# Patient Record
Sex: Female | Born: 1940 | Race: White | Hispanic: No | Smoking: Never smoker
Health system: Southern US, Community
[De-identification: ages and names within clinical notes are randomized; demographics above are authoritative.]

## PROBLEM LIST (undated history)

## (undated) ENCOUNTER — Emergency Department (HOSPITAL_COMMUNITY): Admission: EM

## (undated) DIAGNOSIS — M8580 Other specified disorders of bone density and structure, unspecified site: Secondary | ICD-10-CM

## (undated) DIAGNOSIS — D259 Leiomyoma of uterus, unspecified: Secondary | ICD-10-CM

## (undated) DIAGNOSIS — K429 Umbilical hernia without obstruction or gangrene: Secondary | ICD-10-CM

## (undated) DIAGNOSIS — K222 Esophageal obstruction: Secondary | ICD-10-CM

## (undated) DIAGNOSIS — M1990 Unspecified osteoarthritis, unspecified site: Secondary | ICD-10-CM

## (undated) DIAGNOSIS — I998 Other disorder of circulatory system: Secondary | ICD-10-CM

## (undated) DIAGNOSIS — I10 Essential (primary) hypertension: Secondary | ICD-10-CM

## (undated) DIAGNOSIS — J309 Allergic rhinitis, unspecified: Secondary | ICD-10-CM

## (undated) DIAGNOSIS — Z8719 Personal history of other diseases of the digestive system: Secondary | ICD-10-CM

## (undated) DIAGNOSIS — K633 Ulcer of intestine: Secondary | ICD-10-CM

## (undated) DIAGNOSIS — R000 Tachycardia, unspecified: Secondary | ICD-10-CM

## (undated) DIAGNOSIS — T7840XA Allergy, unspecified, initial encounter: Secondary | ICD-10-CM

## (undated) DIAGNOSIS — F419 Anxiety disorder, unspecified: Secondary | ICD-10-CM

## (undated) DIAGNOSIS — D649 Anemia, unspecified: Secondary | ICD-10-CM

## (undated) DIAGNOSIS — Z5189 Encounter for other specified aftercare: Secondary | ICD-10-CM

## (undated) DIAGNOSIS — K5790 Diverticulosis of intestine, part unspecified, without perforation or abscess without bleeding: Secondary | ICD-10-CM

## (undated) DIAGNOSIS — M419 Scoliosis, unspecified: Secondary | ICD-10-CM

## (undated) DIAGNOSIS — N189 Chronic kidney disease, unspecified: Secondary | ICD-10-CM

## (undated) DIAGNOSIS — H269 Unspecified cataract: Secondary | ICD-10-CM

## (undated) DIAGNOSIS — E785 Hyperlipidemia, unspecified: Secondary | ICD-10-CM

## (undated) DIAGNOSIS — H04123 Dry eye syndrome of bilateral lacrimal glands: Secondary | ICD-10-CM

## (undated) DIAGNOSIS — D126 Benign neoplasm of colon, unspecified: Secondary | ICD-10-CM

## (undated) DIAGNOSIS — K219 Gastro-esophageal reflux disease without esophagitis: Secondary | ICD-10-CM

## (undated) HISTORY — DX: Essential (primary) hypertension: I10

## (undated) HISTORY — DX: Unspecified osteoarthritis, unspecified site: M19.90

## (undated) HISTORY — DX: Allergic rhinitis, unspecified: J30.9

## (undated) HISTORY — DX: Diverticulosis of intestine, part unspecified, without perforation or abscess without bleeding: K57.90

## (undated) HISTORY — DX: Unspecified cataract: H26.9

## (undated) HISTORY — DX: Allergy, unspecified, initial encounter: T78.40XA

## (undated) HISTORY — PX: ESOPHAGEAL DILATION: SHX303

## (undated) HISTORY — DX: Leiomyoma of uterus, unspecified: D25.9

## (undated) HISTORY — DX: Gastro-esophageal reflux disease without esophagitis: K21.9

## (undated) HISTORY — DX: Personal history of other diseases of the digestive system: Z87.19

## (undated) HISTORY — DX: Esophageal obstruction: K22.2

## (undated) HISTORY — DX: Benign neoplasm of colon, unspecified: D12.6

## (undated) HISTORY — DX: Scoliosis, unspecified: M41.9

## (undated) HISTORY — PX: UPPER GASTROINTESTINAL ENDOSCOPY: SHX188

## (undated) HISTORY — DX: Encounter for other specified aftercare: Z51.89

## (undated) HISTORY — DX: Dry eye syndrome of bilateral lacrimal glands: H04.123

## (undated) HISTORY — DX: Anxiety disorder, unspecified: F41.9

## (undated) HISTORY — DX: Anemia, unspecified: D64.9

## (undated) HISTORY — PX: BIOPSY THYROID: PRO38

## (undated) HISTORY — DX: Hyperlipidemia, unspecified: E78.5

## (undated) HISTORY — PX: POLYPECTOMY: SHX149

## (undated) HISTORY — PX: COLONOSCOPY: SHX174

## (undated) HISTORY — DX: Other specified disorders of bone density and structure, unspecified site: M85.80

## (undated) HISTORY — DX: Tachycardia, unspecified: R00.0

## (undated) HISTORY — DX: Umbilical hernia without obstruction or gangrene: K42.9

## (undated) HISTORY — PX: OTHER SURGICAL HISTORY: SHX169

## (undated) HISTORY — DX: Chronic kidney disease, unspecified: N18.9

---
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
## 1966-12-01 HISTORY — PX: TONSILLECTOMY: SHX5217

## 1994-01-29 HISTORY — PX: OTHER SURGICAL HISTORY: SHX169

## 2004-09-27 ENCOUNTER — Encounter: Payer: Self-pay | Admitting: Internal Medicine

## 2008-08-14 ENCOUNTER — Encounter: Payer: Self-pay | Admitting: Internal Medicine

## 2008-08-14 LAB — CONVERTED CEMR LAB
CO2: 22 meq/L
Calcium: 10.4 mg/dL
Glucose, Bld: 86 mg/dL
Sodium: 140 meq/L

## 2008-10-31 LAB — CONVERTED CEMR LAB: Pap Smear: NEGATIVE

## 2008-11-01 ENCOUNTER — Encounter: Payer: Self-pay | Admitting: Internal Medicine

## 2008-11-02 ENCOUNTER — Encounter: Payer: Self-pay | Admitting: Internal Medicine

## 2008-11-02 LAB — CONVERTED CEMR LAB
AST: 29 units/L
Alkaline Phosphatase: 49 units/L
Calcium: 9.9 mg/dL
Chloride: 105 meq/L
Cholesterol: 253 mg/dL
Glucose, Bld: 104 mg/dL
HCT: 43.6 %
Hemoglobin: 14.1 g/dL
LDL Cholesterol: 159 mg/dL
Monocytes Relative: 7 %
Platelets: 207 10*3/uL
Potassium: 4.2 meq/L
Sodium: 142 meq/L
Total Protein: 6.8 g/dL

## 2008-12-14 ENCOUNTER — Encounter: Payer: Self-pay | Admitting: Internal Medicine

## 2009-05-11 ENCOUNTER — Encounter: Payer: Self-pay | Admitting: Internal Medicine

## 2009-08-01 LAB — HM DIABETES EYE EXAM: HM Diabetic Eye Exam: NORMAL

## 2009-08-09 ENCOUNTER — Encounter: Payer: Self-pay | Admitting: Internal Medicine

## 2009-12-13 ENCOUNTER — Ambulatory Visit: Payer: Self-pay | Admitting: Internal Medicine

## 2009-12-13 DIAGNOSIS — Z860100 Personal history of colon polyps, unspecified: Secondary | ICD-10-CM | POA: Insufficient documentation

## 2009-12-13 DIAGNOSIS — D259 Leiomyoma of uterus, unspecified: Secondary | ICD-10-CM | POA: Insufficient documentation

## 2009-12-13 DIAGNOSIS — K219 Gastro-esophageal reflux disease without esophagitis: Secondary | ICD-10-CM | POA: Insufficient documentation

## 2009-12-13 DIAGNOSIS — E1169 Type 2 diabetes mellitus with other specified complication: Secondary | ICD-10-CM | POA: Insufficient documentation

## 2009-12-13 DIAGNOSIS — Z8719 Personal history of other diseases of the digestive system: Secondary | ICD-10-CM | POA: Insufficient documentation

## 2009-12-13 DIAGNOSIS — E785 Hyperlipidemia, unspecified: Secondary | ICD-10-CM | POA: Insufficient documentation

## 2009-12-13 DIAGNOSIS — I10 Essential (primary) hypertension: Secondary | ICD-10-CM | POA: Insufficient documentation

## 2009-12-13 DIAGNOSIS — I152 Hypertension secondary to endocrine disorders: Secondary | ICD-10-CM | POA: Insufficient documentation

## 2009-12-13 DIAGNOSIS — J309 Allergic rhinitis, unspecified: Secondary | ICD-10-CM | POA: Insufficient documentation

## 2010-01-01 ENCOUNTER — Encounter: Payer: Self-pay | Admitting: Internal Medicine

## 2010-01-01 DIAGNOSIS — M1990 Unspecified osteoarthritis, unspecified site: Secondary | ICD-10-CM | POA: Insufficient documentation

## 2010-01-01 DIAGNOSIS — K222 Esophageal obstruction: Secondary | ICD-10-CM | POA: Insufficient documentation

## 2010-01-09 ENCOUNTER — Encounter: Payer: Self-pay | Admitting: Internal Medicine

## 2010-01-09 LAB — HM MAMMOGRAPHY

## 2010-04-04 ENCOUNTER — Ambulatory Visit: Payer: Self-pay | Admitting: Internal Medicine

## 2010-04-05 LAB — CONVERTED CEMR LAB
Cholesterol: 285 mg/dL — ABNORMAL HIGH (ref 0–200)
Creatinine, Ser: 0.8 mg/dL (ref 0.4–1.2)
HDL: 51.8 mg/dL (ref >39.00)
Triglycerides: 257 mg/dL — ABNORMAL HIGH (ref 0.0–149.0)
VLDL: 51.4 mg/dL — ABNORMAL HIGH (ref 0.0–40.0)

## 2010-07-09 ENCOUNTER — Telehealth (INDEPENDENT_AMBULATORY_CARE_PROVIDER_SITE_OTHER): Payer: Self-pay | Admitting: *Deleted

## 2010-07-10 ENCOUNTER — Encounter: Payer: Self-pay | Admitting: Internal Medicine

## 2010-08-12 ENCOUNTER — Telehealth: Payer: Self-pay | Admitting: Internal Medicine

## 2010-10-17 ENCOUNTER — Ambulatory Visit: Payer: Self-pay | Admitting: Internal Medicine

## 2010-10-18 ENCOUNTER — Encounter (INDEPENDENT_AMBULATORY_CARE_PROVIDER_SITE_OTHER): Payer: Self-pay | Admitting: *Deleted

## 2010-12-10 ENCOUNTER — Ambulatory Visit: Admit: 2010-12-10 | Payer: Self-pay | Admitting: Gastroenterology

## 2010-12-21 ENCOUNTER — Emergency Department (HOSPITAL_BASED_OUTPATIENT_CLINIC_OR_DEPARTMENT_OTHER)
Admission: EM | Admit: 2010-12-21 | Discharge: 2010-12-21 | Payer: Self-pay | Source: Home / Self Care | Admitting: Emergency Medicine

## 2010-12-23 ENCOUNTER — Encounter: Payer: Self-pay | Admitting: Internal Medicine

## 2010-12-31 NOTE — Procedures (Signed)
Summary: Colonoscopy/[facility]  Colonoscopy/[facility]   Imported By: Sherian Rein 12/19/2009 11:20:34  _____________________________________________________________________  External Attachment:    Type:   Image     Comment:   External Document

## 2010-12-31 NOTE — Assessment & Plan Note (Signed)
Summary: NEW/ MEDICARE AND [employer] STATE /NWS  #   Vital Signs:  Patient profile:   70 year old female Height:      64 inches (162.56 cm) Weight:      187.0 pounds (85 kg) BMI:     32.21 O2 Sat:      97 % on Room air Temp:     97.9 degrees F (36.61 degrees C) oral Pulse rate:   76 / minute BP sitting:   132 / 70  (left arm) Cuff size:   large  Vitals Entered By: Orlan Leavens (December 13, 2009 9:42 AM)  O2 Flow:  Room air CC: new patient Is Patient Diabetic? No Pain Assessment Patient in pain? no        Primary Care Provider:  Newt Lukes MD  CC:  new patient.  History of Present Illness: new pt to me and to our practice - here to est care -  1) HTN - reports compliance with ongoing medical treatment and no changes in medication dose or frequency. denies adverse side effects related to current therapy.   2) dyslipidemia - reports compliance with ongoing medical treatment and no changes in medication dose or frequency. denies adverse side effects related to current therapy.  intol to statins in past (tried lipitor and crestor)  3)hx colon polyps 08/2004 - not due for repeat screening 2015 per her report  4) allergic rhinitis - reports compliance with ongoing medical treatment and no changes in medication dose or frequency. denies adverse side effects related to current therapy.   5) dysphagia hx - s/p EGD with "stretching" 08/2009 - now on PPI since and having no further problems -?if needs to take PPI once daily   Preventive Screening-Counseling & Management  Alcohol-Tobacco     Alcohol drinks/day: 0     Alcohol Counseling: not indicated; patient does not drink     Smoking Status: never     Tobacco Counseling: not indicated; no tobacco use  Caffeine-Diet-Exercise     Caffeine use/day: 1-2     Caffeine Counseling: not indicated; caffeine use is not excessive or problematic     Does Patient Exercise: no     Exercise Counseling: to improve exercise regimen     Depression Counseling: not indicated; screening negative for depression  Safety-Violence-Falls     Seat Belt Use: yes     Helmet Use: yes     Firearms in the Home: no firearms in the home     Smoke Detectors: yes     Violence in the Home: no risk noted     Fall Risk Counseling: not indicated; no significant falls noted  Clinical Review Panels:  Prevention   Last Mammogram:  done @ [facility] results; normal (12/01/2008)   Last Pap Smear:  Interpretation/Result:Negative for intraepithelial Lesion or Malignancy.    (10/31/2008)   Last Colonoscopy:  done in [place], dr. Luan Pulling Results: Normal. (08/31/2004)  Immunizations   Last Tetanus Booster:  Historical (10/01/2006)   Last Flu Vaccine:  Historical (10/01/2009)   Current Medications (verified): 1)  Trilipix 135 Mg Cpdr (Choline Fenofibrate) .... Take 1 By Mouth Qd 2)  Metoprolol Tartrate 50 Mg Tabs (Metoprolol Tartrate) .... Take 1 Two Times A Day 3)  Pantoprazole Sodium 40 Mg Tbec (Pantoprazole Sodium) .... Take 1 By Mouth Qd 4)  Aspirin 81 Mg Tbec (Aspirin) .... Once Daily 5)  Kls Allerclear 10 Mg Tabs (Loratadine) .... Once Daily 6)  Fish Oil 1000 Mg Caps (  Omega-3 Fatty Acids) .... Take 3 By Mouth Qd 7)  Multivitamins  Tabs (Multiple Vitamin) .... Once Daily  Allergies (verified): 1)  ! Penicillin 2)  ! 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 I have reviewed all other systems and they were negative.   Physical Exam  General:  alert, well-developed, well-nourished, and cooperative to examination.    Eyes:  vision grossly intact; pupils equal, round and reactive to light.  conjunctiva and lids normal.    Lungs:  normal respiratory effort, no intercostal retractions or use of accessory muscles; normal breath sounds bilaterally - no crackles and no wheezes.    Heart:  normal rate, regular rhythm, no murmur, and no rub. BLE without edema. Abdomen:  soft, non-tender, normal bowel sounds, no distention; no masses and no appreciable hepatomegaly or splenomegaly.   Psych:  Oriented X3, memory intact for recent and remote, normally interactive, good eye contact, not anxious appearing, not depressed appearing, and not agitated.     Diabetes Management Exam:    Eye Exam:       Eye Exam done elsewhere          Date: 08/01/2009          Results: normal          Done by: Done by dr. bell and dr. Fayrene Fearing in [place]   Impression & Recommendations:  Problem # 1:  HYPERLIPIDEMIA (ICD-272.4) intol of statins in past -(lipitor/crestor caused thumb pains) will send for old records to compare values - pt to work on inc exercise and weight loss to better manage will check FLP next visit (pt to come fasting) and cont same tx until then Her updated medication list for this problem includes:    Trilipix 135 Mg Cpdr (Choline fenofibrate) .Marland Kitchen... Take 1 by mouth qd  Problem # 2:  HYPERTENSION (ICD-401.9)  Her updated medication list for this problem includes:    Metoprolol Tartrate 50 Mg Tabs (Metoprolol tartrate) .Marland Kitchen... Take 1 two times a day  BP today: 132/70  Problem # 3:  GERD (ICD-530.81) GERD symptoms and dysphagia sound like stricture with recent dilation 08/2009 will  send for copies of GI records to review and incorp into EMR  Her updated medication list for this problem includes:    Pantoprazole Sodium 40 Mg Tbec (Pantoprazole sodium) .Marland Kitchen... Take 1 by mouth qd  Problem # 4:  ALLERGIC RHINITIS (ICD-477.9)  Her updated medication list for this problem includes:    Kls Allerclear 10 Mg Tabs (Loratadine) ..... Once daily  Problem # 5:  ARTHRITIS (ICD-716.90)  Complete Medication List: 1)  Trilipix 135 Mg Cpdr (Choline fenofibrate) .... Take 1 by mouth qd 2)  Metoprolol Tartrate 50 Mg Tabs (Metoprolol tartrate) .... Take 1 two times a day 3)  Pantoprazole Sodium 40 Mg  Tbec (Pantoprazole sodium) .... Take 1 by mouth qd 4)  Aspirin 81 Mg Tbec (Aspirin) .... Once daily 5)  Kls Allerclear 10 Mg Tabs (Loratadine) .... Once daily 6)  Fish Oil 1000 Mg Caps (Omega-3 fatty acids) .... Take 3 by mouth qd 7)  Multivitamins Tabs (Multiple vitamin) .... Once daily  Other Orders: Misc. Referral (Misc. Ref)  Patient Instructions: 1)  it was good to see you today.  2)  will send for copies of records from dr. Judithann Graves and dr. Luan Pulling to incorporate into our records. 3)  will make referral to schedule your mammogram. Our office will contact you regarding this appointment once made.  4)  refills on medications as requested - your prescription has been electronically submitted to your pharmacy. Please take as directed. Contact our office if you believe you're having problems with the medication(s).  5)  Please schedule a follow-up appointment in 6 months for cholesterol check, sooner if problems.  Prescriptions: METOPROLOL TARTRATE 50 MG TABS (METOPROLOL TARTRATE) take 1 two times a day  #60 x 11   Entered and Authorized by:   Newt Lukes MD   Signed by:   Newt Lukes MD on 12/13/2009   Method used:   Electronically to        [facility] (retail)       [address]       [place], [place]  [address]       Ph:  [phone] or [phone]       Fax: [phone]   RxID:   [id number]    EGD  Procedure date:  11/01/1999  Findings:      done @ [facility], by dr. Luan Pulling  results: Normal, but she did states they stretch esophagus  EGD  Procedure date:  08/01/2009  Findings:      Repeat done @ [facility]  Results: normal  Colonoscopy  Procedure date:  08/31/2004  Findings:      done in [place], dr. Luan Pulling Results: Normal.   CT Scan  Procedure date:  11/01/2007  Findings:      Results: diverticulitis, done in [facility]  Pap Smear  Procedure date:  10/31/2008  Findings:      Interpretation/Result:Negative for intraepithelial Lesion or Malignancy.     Mammogram  Procedure date:  12/01/2008  Findings:      done @  diagnostic imaging results; normal    Immunization History:  Influenza Immunization History:    Influenza:  historical (10/01/2009)  Tetanus/Td Immunization History:    Tetanus/Td:  historical (10/01/2006)

## 2010-12-31 NOTE — Progress Notes (Signed)
Summary: [facility] refills  Phone Note Refill Request Message from:  Fax from Pharmacy on August 12, 2010 2:31 PM  Refills Requested: Medication #1:  PANTOPRAZOLE SODIUM 40 MG TBEC take 1 by mouth once daily  Medication #2:  METOPROLOL TARTRATE 50 MG TABS take 1 two times a day  Medication #3:  TRILIPIX 135 MG CPDR take 1 by mouth once daily [facility] [phone]  Initial call taken by: Orlan Leavens RMA,  August 12, 2010 2:32 PM    Prescriptions: PANTOPRAZOLE SODIUM 40 MG TBEC (PANTOPRAZOLE SODIUM) take 1 by mouth once daily  #90 x 1   Entered by:   Orlan Leavens RMA   Authorized by:   Newt Lukes MD   Signed by:   Orlan Leavens RMA on 08/12/2010   Method used:   Faxed to ...       [facility] (mail-order)             , [place]         Ph: [phone]       Fax: [phone]   RxID:   [id number] METOPROLOL TARTRATE 50 MG TABS (METOPROLOL TARTRATE) take 1 two times a day  #180 x 1   Entered by:   Orlan Leavens RMA   Authorized by:   Newt Lukes MD   Signed by:   Orlan Leavens RMA on 08/12/2010   Method used:   Faxed to ...       [facility] (mail-order)             , [place]         Ph: [phone]       Fax: [phone]   RxID:   [id number] TRILIPIX 135 MG CPDR (CHOLINE FENOFIBRATE) take 1 by mouth once daily  #90 x 1   Entered by:   Orlan Leavens RMA   Authorized by:   Newt Lukes MD   Signed by:   Orlan Leavens RMA on 08/12/2010   Method used:   Faxed to ...       [facility] (mail-order)             , [place]         Ph: [phone]       Fax: [phone]   RxID:   [id number]

## 2010-12-31 NOTE — Medication Information (Signed)
Summary: Prior [employer] Approved for Pantoprazole/[facility]  Prior [employer] Approved for Pantoprazole/[facility]   Imported By: Sherian Rein 07/15/2010 12:07:33  _____________________________________________________________________  External Attachment:    Type:   Image     Comment:   External Document

## 2010-12-31 NOTE — Progress Notes (Signed)
Summary: PA-Pantoprazole  Phone Note From Pharmacy   Summary of Call: PA-Pantoprazole, called [facility] @ [phone], awaiting form. Dagoberto Reef  July 09, 2010 3:30 PM  Faxed to PA-Pantoprazole to [facility] @ [phone], awaiting approval Dagoberto Reef  July 10, 2010 4:57 PM  Initial call taken by: Dagoberto Reef,  July 10, 2010 4:58 PM  Follow-up for Phone Call        PA-Pantoprazole approved 06/19/10-07/10/2011, pt aware. Follow-up by: Dagoberto Reef,  July 11, 2010 3:53 PM

## 2010-12-31 NOTE — Letter (Signed)
Summary: New Patient letter  [facility]  [address] [place], [place] [address]   Phone: [phone]  Fax: [phone]       10/18/2010 MRN: [id number]  [name] [address] [place], [place]  [address]  Dear Ms. Gallus,  Welcome to the Gastroenterology Division at [facility].    You are scheduled to see Dr. Jarold Motto on 12/05/2010 at 8:30AM on the 3rd floor at [facility], [address]  We ask that you try to arrive at our office 15 minutes prior to your appointment time to allow for check-in.  We would like you to complete the enclosed self-administered evaluation form prior to your visit and bring it with you on the day of your appointment.  We will review it with you.  Also, please bring a complete list of all your medications or, if you prefer, bring the medication bottles and we will list them.  Please bring your insurance card so that we may make a copy of it.  If your insurance requires a referral to see a specialist, please bring your referral form from your primary care physician.  Co-payments are due at the time of your visit and may be paid by cash, check or credit card.     Your office visit will consist of a consult with your physician (includes a physical exam), any laboratory testing he/she may order, scheduling of any necessary diagnostic testing (e.g. x-ray, ultrasound, CT-scan), and scheduling of a procedure (e.g. Endoscopy, Colonoscopy) if required.  Please allow enough time on your schedule to allow for any/all of these possibilities.    If you cannot keep your appointment, please call [phone] to cancel or reschedule prior to your appointment date.  This allows [place] the opportunity to schedule an appointment for another patient in need of care.  If you do not cancel or reschedule by 5 p.m. the business day prior to your appointment date, you will be charged a $50.00 late cancellation/no-show fee.    Thank you for  choosing [facility] for your medical needs.  We appreciate the opportunity to care for you.  Please visit [place] at our website  to learn more about our practice.                     Sincerely,                                                             The Gastroenterology Division

## 2010-12-31 NOTE — Assessment & Plan Note (Signed)
Summary: 6 mth fu  stc   Vital Signs:  Patient profile:   70 year old female Height:      64 inches (162.56 cm) Weight:      183 pounds (83.18 kg) BMI:     31.53 O2 Sat:      97 % on Room air Temp:     98.1 degrees F (36.72 degrees C) oral Pulse rate:   68 / minute BP sitting:   132 / 72  (left arm) Cuff size:   large  Vitals Entered By: Orlan Leavens RMA (October 17, 2010 10:23 AM)  O2 Flow:  Room air CC: 6 month follow-up Is Patient Diabetic? No Pain Assessment Patient in pain? no        Primary Care Provider:  Newt Lukes MD  CC:  6 month follow-up.  History of Present Illness: Here for f/u 1) HTN - reports compliance with ongoing medical treatment and no changes in medication dose or frequency. denies adverse side effects related to current therapy. no CP or HA  2) dyslipidemia - reports compliance with ongoing medical treatment and no changes in medication dose or frequency. denies adverse side effects related to current therapy.  intol to statins in past (tried lipitor and crestor)  3) hx colon polyps 08/2004 - not due for repeat screening 2015 per her report  4) allergic rhinitis - reports compliance with ongoing medical treatment and no changes in medication dose or frequency. denies adverse side effects related to current therapy.   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 Take 1 By Mouth Once  Daily 2)  Metoprolol Tartrate 50 Mg Tabs (Metoprolol Tartrate) .... Take 1 Two Times A Day 3)  Pantoprazole Sodium 40 Mg Tbec (Pantoprazole Sodium) .... Take 1 By Mouth Once Daily 4)  Aspirin 81 Mg Tbec (Aspirin) .... Once Daily 5)  Kls Allerclear 10 Mg Tabs (Loratadine) .... Once Daily 6)  Fish Oil 1000 Mg Caps (Omega-3 Fatty Acids) .... Take 3 By Mouth Once Daily 7)  Niacor 500 Mg Tabs (Niacin) .Marland Kitchen.. 1 By Mouth At Bedtime X 7 Days, Then 2 By Mouth At Bedtime  Allergies (verified): 1)  ! Penicillin 2)  ! * Statins  Past History:  Past Medical History: Allergic rhinitis Colonic polyps, hx of -08/2004 Diverticulitis, hx of Hyperlipidemia - intol of statins Hypertension GERD esophageal stricture s/p stretch x 2 (11/1999 and 08/2009) Osteoarthritis  MD roster: GI - [facility]  Review of Systems  The patient denies anorexia, weight gain, chest pain, syncope, and headaches.    Physical Exam  General:  alert, well-developed, well-nourished, and cooperative to examination.    Lungs:  normal respiratory effort, no intercostal retractions or use of accessory muscles; normal breath sounds bilaterally - no crackles and no wheezes.    Heart:  normal rate, regular rhythm, no murmur, and no rub. BLE without edema. Abdomen:  soft, non-tender,  normal bowel sounds, no distention; no masses and no appreciable hepatomegaly or splenomegaly.     Impression & Recommendations:  Problem # 1:  HYPERLIPIDEMIA (ICD-272.4)  Her updated medication list for this problem includes:    Trilipix 135 Mg Cpdr (Choline fenofibrate) .Marland Kitchen... Take 1 by mouth once daily    Niacor 500 Mg Tabs (Niacin) .Marland Kitchen... 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 Take 1 by mouth once daily  GERD symptoms and dysphagia sound like stricture with last dilation 08/2009 prior GI noted and procedures reviewed - cont PPI and refer to GI  EGD: Repeat done @ [facility]  Results: normal (08/01/2009)  Labs Reviewed: Hgb: 14.1 (11/02/2008)   Hct: 43.6 (11/02/2008)  Orders: Gastroenterology Referral (GI)  Problem # 4:  HYPERTENSION (ICD-401.9)  Her updated medication list for this problem includes:    Metoprolol Tartrate 50 Mg Tabs (Metoprolol tartrate) .Marland Kitchen... Take 1 two times a day  BP today: 132/72 Prior BP: 122/78 (04/04/2010)  Labs Reviewed: K+: 4.2 (11/02/2008) Creat: : 0.8 (04/04/2010)   Chol: 285 (04/04/2010)   HDL: 51.80 (04/04/2010)   LDL: 159 (11/02/2008)   TG: 257.0 (04/04/2010)  Time spent with patient 25 minutes, more than 50% of this time was spent counseling patient on need for repeat GI eval and improtance of med compliance  Complete Medication List: 1)  Trilipix 135 Mg Cpdr (Choline fenofibrate) .... Take 1 by mouth once daily 2)  Metoprolol Tartrate 50 Mg Tabs (Metoprolol tartrate) .... Take 1 two times a day 3)  Pantoprazole Sodium 40 Mg Tbec (Pantoprazole sodium) .... Take 1 by mouth once daily 4)  Aspirin 81 Mg Tbec (Aspirin) .... Once daily 5)  Kls Allerclear 10 Mg Tabs (Loratadine) .... Once daily 6)  Fish Oil 1000 Mg Caps (Omega-3 fatty acids) .... Take 3 by mouth once daily 7)  Niacor 500 Mg Tabs (Niacin) .Marland Kitchen.. 1 by mouth at bedtime x 7 days, then 2 by mouth at  bedtime  Patient Instructions: 1)  it was good to see you today. 2)  remember to take all medications as prescribed! call when if refills needed 3)  we'll make referral to GI for evaluation of your swaloowing problems. Our office will contact you regarding this appointment once made.   always take small well chewed bites and lots of fluids when you eat! 4)  Please schedule a follow-up appointment in 6 months, call sooner if problems. will recheck cholesterol at that time   Orders Added: 1)  Est. Patient Level IV [09735] 2)  Gastroenterology Referral [GI]

## 2010-12-31 NOTE — Procedures (Signed)
Summary: EGD w/dilation/[facility]  EGD w/dilation/[facility]   Imported By: Sherian Rein 12/19/2009 11:15:09  _____________________________________________________________________  External Attachment:    Type:   Image     Comment:   External Document

## 2010-12-31 NOTE — Assessment & Plan Note (Signed)
Summary: 6 MO ROV / TO COME FASTING/NWS  #   Vital Signs:  Patient profile:   70 year old female Height:      64 inches (162.56 cm) Weight:      184.8 pounds (84 kg) O2 Sat:      98 % on Room air Temp:     97.7 degrees F (36.50 degrees C) oral Pulse rate:   64 / minute BP sitting:   122 / 78  (left arm) Cuff size:   large  Vitals Entered By: Orlan Leavens (Apr 04, 2010 10:33 AM)  O2 Flow:  Room air CC: 6 month follow-up Is Patient Diabetic? No Pain Assessment Patient in pain? no        Primary Care Provider:  Newt Lukes MD  CC:  6 month follow-up.  History of Present Illness: Here for Medicare AWV:  1.   Risk factors based on Past M, S, F history: see below 2.   Physical Activities: no compromise, normal 3.   Depression/mood: reviewed, no deficits 4.   Hearing: normal screen 5.   ADL's: assessed, normal 6.   Fall Risk: low 7.   Home Safety: reviewed 8.   Height, weight, &visual acuity: done 9.   Counseling: done as indicated 10.   Labs ordered based on risk factors: see below 11.           Referral Coordination -done 12.           Care Plan - see below 13.            Cognitive Assessment - done, normalorientation to time/place, lang, writing, speech and recall  also review of other chroinc medical issues:  1) HTN - reports compliance with ongoing medical treatment and no changes in medication dose or frequency. denies adverse side effects related to current therapy.   2) dyslipidemia - reports compliance with ongoing medical treatment and no changes in medication dose or frequency. denies adverse side effects related to current therapy.  intol to statins in past (tried lipitor and crestor)  3) hx colon polyps 08/2004 - not due for repeat screening 2015 per her report  4) allergic rhinitis - reports compliance with ongoing medical treatment and no changes in medication dose or frequency. denies adverse side effects related to current therapy.   5)  dysphagia hx - s/p EGD with "stretching" 08/2009 - now off PPI x 1 mo because ran out - increasing burn sensation in throat but no regurgitation or vomitting, no odynaphagia  6) c/o pain in little finger right hand, DIP - "jammed" finger x 2 events 3-4 mos ago - has remained swollen and painful since that time  Preventive Screening-Counseling & Management  Alcohol-Tobacco     Alcohol drinks/day: 0     Alcohol Counseling: not indicated; patient does not drink     Smoking Status: never     Tobacco Counseling: not indicated; no tobacco use  Caffeine-Diet-Exercise     Caffeine use/day: 1-2     Caffeine Counseling: not indicated; caffeine use is not excessive or problematic     Does Patient Exercise: no     Exercise Counseling: to improve exercise regimen     Depression Counseling: not indicated; screening negative for depression  Safety-Violence-Falls     Seat Belt Use: yes     Helmet Use: yes     Firearms in the Home: no firearms in the home     Smoke Detectors: yes  Violence in the Home: no risk noted     Fall Risk Counseling: not indicated; no significant falls noted  Clinical Review Panels:  Prevention   Last Mammogram:  Done @ [facility] Impression: No specific mammographic evidence of malignancy.  Assessment: BIRADS 1. (01/09/2010)   Last Pap Smear:  Interpretation/Result:Negative for intraepithelial Lesion or Malignancy.    (11/02/2008)   Last Colonoscopy:  done in [place], dr. Luan Pulling Results: Normal. (08/31/2004)  Immunizations   Last Tetanus Booster:  Historical (10/01/2006)   Last Flu Vaccine:  Historical (10/01/2009)  Lipid Management   Cholesterol:  253 (11/02/2008)   LDL (bad choesterol):  159 (11/02/2008)   HDL (good cholesterol):  46 (11/02/2008)   Triglycerides:  240 (11/02/2008)  CBC   WBC:  5.9 (11/02/2008)   RBC:  4.79 (11/02/2008)   Hgb:  14.1 (11/02/2008)   Hct:  43.6 (11/02/2008)   Platelets:  207 (11/02/2008)   MCV  91.0 (11/02/2008)    RDW  14.3 (11/02/2008)   PMN:  54 (11/02/2008)   Monos:  7 (11/02/2008)   Eosinophils:  6 (11/02/2008)   Basophil:  0 (11/02/2008)  Complete Metabolic Panel   Glucose:  104 (11/02/2008)   Sodium:  142 (11/02/2008)   Potassium:  4.2 (11/02/2008)   Chloride:  105 (11/02/2008)   CO2:  23 (11/02/2008)   BUN:  20 (11/02/2008)   Creatinine:  0.97 (11/02/2008)   Albumin:  4.1 (11/02/2008)   Total Protein:  6.8 (11/02/2008)   Calcium:  9.9 (11/02/2008)   Total Bili:  0.7 (11/02/2008)   Alk Phos:  49 (11/02/2008)   SGPT (ALT):  31 (11/02/2008)   SGOT (AST):  29 (11/02/2008)   Current Medications (verified): 1)  Trilipix 135 Mg Cpdr (Choline Fenofibrate) .... Take 1 By Mouth Qd 2)  Metoprolol Tartrate 50 Mg Tabs (Metoprolol Tartrate) .... Take 1 Two Times A Day 3)  Pantoprazole Sodium 40 Mg Tbec (Pantoprazole Sodium) .... Take 1 By Mouth Qd 4)  Aspirin 81 Mg Tbec (Aspirin) .... Once Daily 5)  Kls Allerclear 10 Mg Tabs (Loratadine) .... Once Daily 6)  Fish Oil 1000 Mg Caps (Omega-3 Fatty Acids) .... Take 3 By Mouth Qd 7)  Multivitamins  Tabs (Multiple Vitamin) .... Once Daily  Allergies (verified): 1)  ! Penicillin 2)  ! * Statins  Past History:  Past medical, surgical, family and social histories (including risk factors) reviewed, and no changes noted (except as noted below).  Past Medical History: Allergic rhinitis Colonic polyps, hx of -08/2004 Diverticulitis, hx of Hyperlipidemia Hypertension GERD esophageal stricture s/p stretch x 2 (11/1999 and 08/2009) Osteoarthritis  MD rooster: GI - none local to [place]  Past Surgical History: Reviewed history from 01/01/2010 and no changes required. 2 child birth (1971 & 73) Low back surgery, 2 bulging disks 01/1994 Tonsillectomy (1968) Fibroidectomy  Family History: Reviewed history from 12/13/2009 and no changes required. Family History of Arthritis (parents) Family History Diabetes 1st degree relative (2 aunts) Family  History High cholesterol (brother and cousins) Heart disease (parent, grandparent)  Social History: Reviewed history from 12/13/2009 and no changes required. 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         also, see HPI above. I have reviewed all other systems and they were negative.   Physical Exam  General:  alert, well-developed, well-nourished, and cooperative to examination.    Eyes:  vision grossly intact; pupils equal, round and reactive to light.  conjunctiva and lids normal.    Ears:  normal pinnae bilaterally, without erythema, swelling, or tenderness to palpation. TMs clear, without effusion, or cerumen impaction. Hearing grossly normal bilaterally  Mouth:  teeth and gums in good repair; mucous membranes moist, without lesions or ulcers. oropharynx clear without exudate, no erythema.  Neck:  supple, full ROM, no masses, no thyromegaly; no thyroid nodules or tenderness. no JVD or carotid bruits.   Lungs:  normal respiratory effort, no intercostal retractions or use of accessory muscles; normal breath sounds bilaterally - no crackles and no wheezes.    Heart:  normal rate, regular rhythm, no murmur, and no rub. BLE without edema. Abdomen:  soft, non-tender, normal bowel sounds, no distention; no masses and no appreciable hepatomegaly or splenomegaly.   Msk:  right 5th DIP enlarged and min red, dec ROM with painful ROM - Neurologic:  alert & oriented X3 and cranial nerves II-XII symetrically intact.  strength normal in all extremities, sensation intact to light touch, and gait normal. speech fluent without dysarthria or aphasia; follows commands with good comprehension.  Skin:  no rashes, vesicles, ulcers, or erythema. No nodules or irregularity to palpation.  Psych:  Oriented X3, memory intact for recent and remote, normally  interactive, good eye contact, not anxious appearing, not depressed appearing, and not agitated.      Impression & Recommendations:  Problem # 1:  PREVENTIVE HEALTH CARE (ICD-V70.0)  Patient has been counseled on age-appropriate routine health concerns for screening and prevention. These are reviewed and up-to-date. Immunizations are up-to-date or declined. Risk factors for depression per USPSTF are reviewed and negative. Patient hearing function is screened today; ADLs are reviewed and addressed as needed; functional ability and level of safety have been reviewed and are appropriate. Education, counseling, and referrals are performed based on age appropriate health issues. Patient has information regarding separate preventitive health services covered by medicare  Orders: First annual wellness visit with prevention plan  [phone])  Problem # 2:  PAIN IN JOINT, HAND (DGL-875.64) ?OA vs other injury given hx - check film now consider hand eval depending on level of pain Orders: T-Hand Right 3 views (73130TC)  Problem # 3:  HYPERTENSION (ICD-401.9)  Her updated medication list for this problem includes:    Metoprolol Tartrate 50 Mg Tabs (Metoprolol tartrate) .Marland Kitchen... Take 1 two times a day  Orders: TLB-Creatinine, Blood (82565-CREA) TLB-Glucose, QUANT (82947-GLU)  BP today: 122/78 Prior BP: 132/70 (12/13/2009)  Labs Reviewed: K+: 4.2 (11/02/2008) Creat: : 0.97 (11/02/2008)   Chol: 253 (11/02/2008)   HDL: 46 (11/02/2008)   LDL: 159 (11/02/2008)   TG: 240 (11/02/2008)  Problem # 4:  HYPERLIPIDEMIA (ICD-272.4)  Her updated medication list for this problem includes:    Trilipix 135 Mg Cpdr (Choline fenofibrate) .Marland Kitchen... Take 1 by mouth once daily  Orders: TLB-Lipid Panel (80061-LIPID) TLB-Glucose, QUANT (82947-GLU)  intol of statins in past -(lipitor/crestor caused thumb pains) pt to work on inc exercise and weight loss to better manage  Labs Reviewed: SGOT: 29 (11/02/2008)    SGPT: 31 (11/02/2008)   HDL:46 (11/02/2008)  LDL:159 (11/02/2008)  Chol:253 (11/02/2008)  Trig:240 (11/02/2008), 439 (08/14/2008)  Problem # 5:  GERD (ICD-530.81)  Her updated medication list for this problem includes:    Pantoprazole Sodium 40 Mg Tbec (Pantoprazole sodium) .Marland Kitchen... Take 1 by mouth once daily  GERD symptoms and dysphagia sound like stricture with recent dilation 08/2009 prior GI noted and procedures reviewd -  resume PPI now  EGD: Repeat done @ [facility]  Results: normal (08/01/2009)  Labs Reviewed: Hgb: 14.1 (11/02/2008)   Hct: 43.6 (11/02/2008)  Complete Medication List: 1)  Trilipix 135 Mg Cpdr (Choline fenofibrate) .... Take 1 by mouth once daily 2)  Metoprolol Tartrate 50 Mg Tabs (Metoprolol tartrate) .... Take 1 two times a day 3)  Pantoprazole Sodium 40 Mg Tbec (Pantoprazole sodium) .... Take 1 by mouth once daily 4)  Aspirin 81 Mg Tbec (Aspirin) .... Once daily 5)  Kls Allerclear 10 Mg Tabs (Loratadine) .... Once daily 6)  Fish Oil 1000 Mg Caps (Omega-3 fatty acids) .... Take 3 by mouth once daily 7)  Multivitamins Tabs (Multiple vitamin) .... Once daily  Patient Instructions: 1)  it was good to see you today. 2)  test(s) ordered today -labs and xray of hand - your results will be posted on the phone tree for review in 48-72 hours from the time of test completion; call [phone] and enter your 9 digit MRN (listed above on this page, just below your name); if any changes need to be made or there are abnormal results, you will be contacted directly.  3)  refills on medications as discussed - 4)  Please schedule a follow-up appointment in 6 months, call sooner if problems.  Prescriptions: PANTOPRAZOLE SODIUM 40 MG TBEC (PANTOPRAZOLE SODIUM) take 1 by mouth qd  #30 x 6   Entered by:   Orlan Leavens   Authorized by:   Newt Lukes MD   Signed by:   Orlan Leavens on 04/04/2010   Method used:   Electronically to        [facility] (retail)        [address]       [place], [place]  [address]       Ph: [phone] or [phone]       Fax: [phone]   RxID:   [id number] TRILIPIX 135 MG CPDR (CHOLINE FENOFIBRATE) take 1 by mouth qd  #30 x 6   Entered by:   Orlan Leavens   Authorized by:   Newt Lukes MD   Signed by:   Orlan Leavens on 04/04/2010   Method used:   Electronically to        [facility] (retail)       [address]       [place], [place]  [address]       Ph: [phone] or [phone]       Fax: [phone]   RxID:   [id number]

## 2011-01-02 NOTE — Consult Note (Signed)
Summary: [facility]  [facility]   Imported By: Sherian Rein 12/26/2010 14:21:00  _____________________________________________________________________  External Attachment:    Type:   Image     Comment:   External Document

## 2011-01-13 ENCOUNTER — Encounter: Payer: Self-pay | Admitting: Internal Medicine

## 2011-01-22 ENCOUNTER — Encounter: Payer: Self-pay | Admitting: Internal Medicine

## 2011-01-22 NOTE — Consult Note (Signed)
Summary: [facility]  [facility]   Imported By: Sherian Rein 01/16/2011 13:52:54  _____________________________________________________________________  External Attachment:    Type:   Image     Comment:   External Document

## 2011-02-06 NOTE — Letter (Signed)
Summary: [facility]  [facility]   Imported By: Sherian Rein 01/28/2011 09:02:28  _____________________________________________________________________  External Attachment:    Type:   Image     Comment:   External Document

## 2011-02-11 ENCOUNTER — Ambulatory Visit: Payer: Medicare Other | Attending: Orthopedic Surgery | Admitting: Physical Therapy

## 2011-02-11 DIAGNOSIS — IMO0001 Reserved for inherently not codable concepts without codable children: Secondary | ICD-10-CM | POA: Insufficient documentation

## 2011-02-11 DIAGNOSIS — M25559 Pain in unspecified hip: Secondary | ICD-10-CM | POA: Insufficient documentation

## 2011-02-11 DIAGNOSIS — M545 Low back pain, unspecified: Secondary | ICD-10-CM | POA: Insufficient documentation

## 2011-02-11 DIAGNOSIS — R293 Abnormal posture: Secondary | ICD-10-CM | POA: Insufficient documentation

## 2011-02-13 ENCOUNTER — Ambulatory Visit: Payer: Medicare Other | Admitting: Rehabilitation

## 2011-02-18 ENCOUNTER — Ambulatory Visit: Payer: Medicare Other | Admitting: Physical Therapy

## 2011-02-20 ENCOUNTER — Ambulatory Visit: Payer: Medicare Other | Admitting: Physical Therapy

## 2011-02-25 ENCOUNTER — Ambulatory Visit: Payer: Medicare Other | Admitting: Physical Therapy

## 2011-02-27 ENCOUNTER — Ambulatory Visit: Payer: Medicare Other | Admitting: Physical Therapy

## 2011-03-04 ENCOUNTER — Ambulatory Visit: Payer: Medicare Other | Attending: Orthopedic Surgery | Admitting: Physical Therapy

## 2011-03-04 DIAGNOSIS — IMO0001 Reserved for inherently not codable concepts without codable children: Secondary | ICD-10-CM | POA: Insufficient documentation

## 2011-03-04 DIAGNOSIS — M545 Low back pain, unspecified: Secondary | ICD-10-CM | POA: Insufficient documentation

## 2011-03-04 DIAGNOSIS — R293 Abnormal posture: Secondary | ICD-10-CM | POA: Insufficient documentation

## 2011-03-04 DIAGNOSIS — M25559 Pain in unspecified hip: Secondary | ICD-10-CM | POA: Insufficient documentation

## 2011-03-06 ENCOUNTER — Ambulatory Visit: Payer: Medicare Other | Admitting: Physical Therapy

## 2011-03-10 ENCOUNTER — Ambulatory Visit: Payer: Medicare Other | Admitting: Rehabilitation

## 2011-03-13 ENCOUNTER — Ambulatory Visit: Payer: Medicare Other | Admitting: Rehabilitation

## 2011-03-17 ENCOUNTER — Ambulatory Visit: Payer: Medicare Other | Admitting: Rehabilitation

## 2011-03-20 ENCOUNTER — Ambulatory Visit: Payer: Medicare Other | Admitting: Rehabilitation

## 2011-03-24 ENCOUNTER — Ambulatory Visit: Payer: Medicare Other | Admitting: Rehabilitation

## 2011-03-26 ENCOUNTER — Ambulatory Visit: Payer: Medicare Other | Admitting: Rehabilitation

## 2011-03-31 ENCOUNTER — Ambulatory Visit: Payer: Medicare Other | Admitting: Rehabilitation

## 2011-04-03 ENCOUNTER — Ambulatory Visit: Payer: Medicare Other | Attending: Orthopedic Surgery | Admitting: Physical Therapy

## 2011-04-03 DIAGNOSIS — M545 Low back pain, unspecified: Secondary | ICD-10-CM | POA: Insufficient documentation

## 2011-04-03 DIAGNOSIS — IMO0001 Reserved for inherently not codable concepts without codable children: Secondary | ICD-10-CM | POA: Insufficient documentation

## 2011-04-03 DIAGNOSIS — R293 Abnormal posture: Secondary | ICD-10-CM | POA: Insufficient documentation

## 2011-04-03 DIAGNOSIS — M25559 Pain in unspecified hip: Secondary | ICD-10-CM | POA: Insufficient documentation

## 2011-04-07 ENCOUNTER — Ambulatory Visit: Payer: Medicare Other | Admitting: Rehabilitation

## 2011-04-10 ENCOUNTER — Ambulatory Visit: Payer: Medicare Other | Admitting: Rehabilitation

## 2011-04-14 ENCOUNTER — Ambulatory Visit: Payer: Medicare Other | Admitting: Rehabilitation

## 2011-04-17 ENCOUNTER — Ambulatory Visit: Payer: Medicare Other | Admitting: Rehabilitation

## 2011-04-21 ENCOUNTER — Ambulatory Visit: Payer: Medicare Other | Admitting: Rehabilitation

## 2011-04-24 ENCOUNTER — Ambulatory Visit: Payer: Medicare Other | Admitting: Rehabilitation

## 2011-05-01 ENCOUNTER — Encounter: Payer: Self-pay | Admitting: Internal Medicine

## 2011-05-05 ENCOUNTER — Ambulatory Visit: Payer: Medicare Other | Attending: Orthopedic Surgery | Admitting: Rehabilitation

## 2011-05-05 DIAGNOSIS — M545 Low back pain, unspecified: Secondary | ICD-10-CM | POA: Insufficient documentation

## 2011-05-05 DIAGNOSIS — M25559 Pain in unspecified hip: Secondary | ICD-10-CM | POA: Insufficient documentation

## 2011-05-05 DIAGNOSIS — IMO0001 Reserved for inherently not codable concepts without codable children: Secondary | ICD-10-CM | POA: Insufficient documentation

## 2011-05-05 DIAGNOSIS — R293 Abnormal posture: Secondary | ICD-10-CM | POA: Insufficient documentation

## 2011-05-07 ENCOUNTER — Ambulatory Visit: Payer: Medicare Other | Admitting: Rehabilitation

## 2011-05-12 ENCOUNTER — Other Ambulatory Visit (INDEPENDENT_AMBULATORY_CARE_PROVIDER_SITE_OTHER): Payer: Medicare Other

## 2011-05-12 ENCOUNTER — Other Ambulatory Visit: Payer: Self-pay | Admitting: Internal Medicine

## 2011-05-12 ENCOUNTER — Ambulatory Visit: Payer: Medicare Other | Admitting: Rehabilitation

## 2011-05-12 ENCOUNTER — Ambulatory Visit (INDEPENDENT_AMBULATORY_CARE_PROVIDER_SITE_OTHER): Payer: Medicare Other | Admitting: Internal Medicine

## 2011-05-12 ENCOUNTER — Encounter: Payer: Self-pay | Admitting: Internal Medicine

## 2011-05-12 DIAGNOSIS — E785 Hyperlipidemia, unspecified: Secondary | ICD-10-CM

## 2011-05-12 DIAGNOSIS — M5430 Sciatica, unspecified side: Secondary | ICD-10-CM

## 2011-05-12 DIAGNOSIS — I10 Essential (primary) hypertension: Secondary | ICD-10-CM

## 2011-05-12 DIAGNOSIS — M5431 Sciatica, right side: Secondary | ICD-10-CM | POA: Insufficient documentation

## 2011-05-12 LAB — LIPID PANEL
Cholesterol: 312 mg/dL — ABNORMAL HIGH (ref 0–200)
Total CHOL/HDL Ratio: 5

## 2011-05-12 NOTE — Assessment & Plan Note (Signed)
BP Readings from Last 3 Encounters:  05/12/11 138/76  10/17/10 132/72  04/04/10 122/78   The current medical regimen is effective;  continue present plan and medications.

## 2011-05-12 NOTE — Progress Notes (Signed)
  Subjective:    Patient ID: Virginia [name], female    DOB: 1941-07-11, 70 y.o.   MRN: [id number]  HPI Here for 6 month follow up -reviewed chronic med issues:  RLE sciatica pain and LBP - dx 12/2010 - tx ongoing by [facility] - s/p 3 ESI and PT ongoing - rx'd meloxicam but has notyet started -  HTN - reports compliance with ongoing medical treatment and no changes in medication dose or frequency.  denies adverse side effects related to current therapy. no CP or HA   dyslipidemia - Reports non compliance with rx'd medical treatment (niacin) but taking fenobibrate. intol of statins in past (tried lipitor and crestor)   hx colon polyps 08/2004 - not due for repeat screening 2015 per her report   allergic rhinitis - reports compliance with ongoing medical treatment and no changes in medication dose or frequency.  denies adverse side effects related to current therapy.   GERD with dysphagia hx - s/p EGD with "stretching" 08/2009 - on PPI  -no regurgitation or vomitting, no dysphagia   Past Medical History  Diagnosis Date  . DIVERTICULITIS, HX OF   . FIBROIDS, UTERUS     s/p fibroidectomy  . 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 ALLERGIC RHINITIS   . OSTEOARTHRITIS   . GERD   . HYPERLIPIDEMIA     intol of statins (myalgia, thumb pain)  . HYPERTENSION      Review of Systems  Constitutional: Negative for fever.  Respiratory: Negative for shortness of breath.   Cardiovascular: Negative for chest pain.  Neurological: Negative for headaches.       Objective:   Physical Exam BP 138/76  Pulse 74  Temp(Src) 97.7 F (36.5 C) (Oral)  Ht 5\' 4"  (1.626 m)  Wt 182 lb 12.8 oz (82.918 kg)  BMI 31.38 kg/m2  SpO2 98% Wt Readings from Last 3 Encounters:  05/12/11 182 lb 12.8 oz (82.918 kg)  10/17/10 183 lb (83.008 kg)  04/04/10 184 lb 12.8 oz (83.825 kg)   Physical Exam  Constitutional: She is oriented to person, place, and time. She appears well-developed and  well-nourished. No distress.  Neck: Normal range of motion. Neck supple. No JVD present. No thyromegaly present.  Cardiovascular: Normal rate, regular rhythm and normal heart sounds.  No murmur heard. No BLE edema. Pulmonary/Chest: Effort normal and breath sounds normal. No respiratory distress. She has no wheezes.  Abdominal: Soft. Bowel sounds are normal. She exhibits no distension. There is no tenderness.  Psychiatric: She has a normal mood and affect. Her behavior is normal. Judgment and thought content normal.   Lab Results  Component Value Date   WBC 5.9 11/02/2008   HGB 14.1 11/02/2008   HCT 43.6 11/02/2008   PLT 207 11/02/2008   CHOL 285* 04/04/2010   TRIG 257.0* 04/04/2010   HDL 51.80 04/04/2010   LDLDIRECT 192.0 04/04/2010   ALT 31 11/02/2008   AST 29 11/02/2008   NA 142 11/02/2008   K 4.2 11/02/2008   CL 105 11/02/2008   CREATININE 0.8 04/04/2010   BUN 20 11/02/2008   CO2 23 11/02/2008   TSH 3.286 11/02/2008       Assessment & Plan:  See problem list. Medications and labs reviewed today.  R sciatica - ongoing tx at [facility], PT, s/p 3 ESI without much change - considering surgery... Encouraged compliance with melxociam ans rx'd by ortho

## 2011-05-12 NOTE — Patient Instructions (Signed)
It was good to see you today. We have reviewed your prior records including labs and tests today Good luck with physical therapy - let our office know if you are going to need back surgery Test(s) ordered today. Your results will be called to you after review (48-72hours after test completion). If any changes need to be made, you will be notified at that time. Medications reviewed, no changes at this time. Please schedule followup in 6 months for blood pressure check, call sooner if problems. Work on lifestyle changes as discussed (low fat, low carb, increased protein diet; improved exercise efforts; weight loss) to control sugar, blood pressure and cholesterol levels and/or reduce risk of developing other medical problems. Look into [email or web address] or other type of food journal to assist you in this process.

## 2011-05-12 NOTE — Assessment & Plan Note (Signed)
Intolerant of statins (lipitor, crestor in prior tx trials) and reluctant to take niacin - uses fish oil and fenofibrate Education of diet/exercise for risk redcution Recheck FLP now

## 2011-05-15 ENCOUNTER — Ambulatory Visit: Payer: Medicare Other | Admitting: Rehabilitation

## 2011-05-19 ENCOUNTER — Ambulatory Visit: Payer: Medicare Other | Admitting: Rehabilitation

## 2011-05-21 ENCOUNTER — Ambulatory Visit: Payer: Medicare Other | Admitting: Rehabilitation

## 2011-05-26 ENCOUNTER — Ambulatory Visit: Payer: Medicare Other | Admitting: Rehabilitation

## 2011-05-29 ENCOUNTER — Ambulatory Visit: Payer: Medicare Other | Admitting: Rehabilitation

## 2011-06-03 ENCOUNTER — Ambulatory Visit: Payer: Medicare Other | Attending: Orthopedic Surgery | Admitting: Rehabilitation

## 2011-06-03 DIAGNOSIS — M545 Low back pain, unspecified: Secondary | ICD-10-CM | POA: Insufficient documentation

## 2011-06-03 DIAGNOSIS — IMO0001 Reserved for inherently not codable concepts without codable children: Secondary | ICD-10-CM | POA: Insufficient documentation

## 2011-06-03 DIAGNOSIS — R293 Abnormal posture: Secondary | ICD-10-CM | POA: Insufficient documentation

## 2011-06-03 DIAGNOSIS — M25559 Pain in unspecified hip: Secondary | ICD-10-CM | POA: Insufficient documentation

## 2011-06-09 ENCOUNTER — Ambulatory Visit: Payer: Medicare Other | Admitting: Physical Therapy

## 2011-06-12 ENCOUNTER — Ambulatory Visit: Payer: Medicare Other | Admitting: Rehabilitation

## 2011-06-16 ENCOUNTER — Ambulatory Visit: Payer: Medicare Other | Admitting: Physical Therapy

## 2011-06-19 ENCOUNTER — Ambulatory Visit: Payer: Medicare Other | Admitting: Physical Therapy

## 2011-06-23 ENCOUNTER — Ambulatory Visit: Payer: Medicare Other | Admitting: Rehabilitation

## 2011-06-25 ENCOUNTER — Ambulatory Visit: Payer: Medicare Other | Admitting: Rehabilitation

## 2011-06-30 ENCOUNTER — Encounter: Payer: BC Managed Care – PPO | Admitting: Rehabilitation

## 2011-07-02 ENCOUNTER — Encounter: Payer: Self-pay | Admitting: Internal Medicine

## 2011-07-10 ENCOUNTER — Other Ambulatory Visit: Payer: Self-pay | Admitting: Internal Medicine

## 2011-08-13 ENCOUNTER — Other Ambulatory Visit: Payer: Self-pay | Admitting: Internal Medicine

## 2011-08-25 ENCOUNTER — Other Ambulatory Visit: Payer: Self-pay | Admitting: *Deleted

## 2011-08-25 MED ORDER — METOPROLOL TARTRATE 50 MG PO TABS: 50.0000 mg | ORAL_TABLET | Freq: Two times a day (BID) | ORAL | Status: DC

## 2011-08-25 MED ORDER — PANTOPRAZOLE SODIUM 40 MG PO TBEC: 40.0000 mg | DELAYED_RELEASE_TABLET | Freq: Every day | ORAL | Status: DC

## 2011-08-25 MED ORDER — CHOLINE FENOFIBRATE 135 MG PO CPDR: 135.0000 mg | DELAYED_RELEASE_CAPSULE | Freq: Every day | ORAL | Status: DC

## 2011-08-25 NOTE — Telephone Encounter (Signed)
Pt call left vm need metoprolol, Triplix, and pantoprazole to go to [facility]. Called pt back was not at home left msg with husband sent med to [facility]...08/25/11@3 :09pm/LMB

## 2011-09-23 ENCOUNTER — Telehealth: Payer: Self-pay | Admitting: *Deleted

## 2011-09-23 MED ORDER — MELOXICAM 15 MG PO TABS: 15.0000 mg | ORAL_TABLET | Freq: Every day | ORAL | Status: DC

## 2011-09-23 NOTE — Telephone Encounter (Signed)
Left msg on vm need new rx for meloxicam sent to [facility] in [place]. Was originally rx by Dr. Alvester Morin no longer see md...09/23/11@12 :32pm/LMB

## 2011-09-23 NOTE — Telephone Encounter (Signed)
Notified pt sent meloxicam to his pharmacy...09/23/11@1 :32pm/LMB

## 2011-12-09 ENCOUNTER — Ambulatory Visit: Payer: BC Managed Care – PPO | Admitting: Internal Medicine

## 2011-12-15 ENCOUNTER — Ambulatory Visit (INDEPENDENT_AMBULATORY_CARE_PROVIDER_SITE_OTHER): Payer: Medicare Other | Admitting: Internal Medicine

## 2011-12-15 ENCOUNTER — Encounter: Payer: Self-pay | Admitting: Internal Medicine

## 2011-12-15 DIAGNOSIS — E785 Hyperlipidemia, unspecified: Secondary | ICD-10-CM

## 2011-12-15 DIAGNOSIS — M5431 Sciatica, right side: Secondary | ICD-10-CM

## 2011-12-15 DIAGNOSIS — I10 Essential (primary) hypertension: Secondary | ICD-10-CM

## 2011-12-15 DIAGNOSIS — K219 Gastro-esophageal reflux disease without esophagitis: Secondary | ICD-10-CM

## 2011-12-15 DIAGNOSIS — M5430 Sciatica, unspecified side: Secondary | ICD-10-CM

## 2011-12-15 MED ORDER — PANTOPRAZOLE SODIUM 40 MG PO TBEC: 40.0000 mg | DELAYED_RELEASE_TABLET | Freq: Every day | ORAL | Status: DC

## 2011-12-15 NOTE — Assessment & Plan Note (Signed)
Intolerant of statins (lipitor, crestor in prior tx trials) and reluctant to take niacin - uses fish oil and fenofibrate Education of diet/exercise for risk reduction - reviewed same again today Recheck FLP annually and as needed

## 2011-12-15 NOTE — Assessment & Plan Note (Signed)
BP Readings from Last 3 Encounters:  12/15/11 130/72  05/12/11 138/76  10/17/10 132/72   The current medical regimen is effective;  continue present plan and medications.

## 2011-12-15 NOTE — Patient Instructions (Signed)
It was good to see you today. We have reviewed your prior records including labs and tests today Medications reviewed, no changes at this time. Refill on medication(s) as discussed today. Call [facility] as we discussed - or let us know if referral needed for chiropractic care (if you find a provider for this on your insurance) Please schedule followup in 6 months for blood pressure and cholesterol check, call sooner if problems. Continue to work on lifestyle changes as discussed (low fat, low carb, increased protein diet; improved exercise efforts; weight loss) to control sugar, blood pressure and cholesterol levels and/or reduce risk of developing other medical problems.

## 2011-12-15 NOTE — Assessment & Plan Note (Signed)
On PPI for same - symptoms well controlled - The current medical regimen is effective;  continue present plan and medications.  follow up GI as needed  

## 2011-12-15 NOTE — Assessment & Plan Note (Signed)
Ortho and PT tx for same spring 2012 - s/p 3 ESI with improvement but not resolved Taking meloxicam qod with fair control but ? Other tx - chiropractor or massage? Not interested in "surgical options" Given info for integrative therapies - pt to call if referral needed for same

## 2011-12-15 NOTE — Progress Notes (Signed)
Subjective:    Patient ID: Virginia [name], female    DOB: [birth year], 71 y.o.   MRN: [id number]  HPI  Here for 6 month follow up -reviewed chronic med issues:  RLE sciatica pain and low back pain  - dx 12/2010 - prior tx by [facility] - s/p 3 ESI and PT - rx'd meloxicam every other day with some improvement, also massage - ?chiropractor services  HTN - reports compliance with ongoing medical treatment and no changes in medication dose or frequency. denies adverse side effects related to current therapy. no chest pain or headache    dyslipidemia - Reports non compliance with rx'd medical treatment (niacin) but taking fenofibrate. intol of statins in past (tried lipitor and crestor)   hx colon polyps 08/2004 - not due for repeat screening until 2015 per her report   allergic rhinitis - reports compliance with ongoing medical treatment and no changes in medication dose or frequency. denies adverse side effects related to current therapy.   GERD with dysphagia hx - s/p EGD with "stretching" 08/2009 - on PPI  -no regurgitation or vomitting, no dysphagia - no other PPI previously used as symptoms always well controlled with pantoprot   Past Medical History  Diagnosis Date  . DIVERTICULITIS, HX OF   . FIBROIDS, UTERUS     s/p fibroidectomy  . 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 ALLERGIC RHINITIS   . OSTEOARTHRITIS   . GERD   . HYPERLIPIDEMIA     intol of statins (myalgia, thumb pain)  . HYPERTENSION      Review of Systems  Constitutional: Negative for fever.  Respiratory: Negative for cough, shortness of breath and wheezing.   Cardiovascular: Negative for chest pain and palpitations.  Neurological: Negative for headaches.       Objective:   Physical Exam  BP 130/72  Pulse 79  Temp(Src) 98.4 F (36.9 C) (Oral)  Wt 181 lb 12.8 oz (82.464 kg)  SpO2 98% Wt Readings from Last 3 Encounters:  12/15/11 181 lb 12.8 oz (82.464 kg)  05/12/11 182 lb 12.8 oz  (82.918 kg)  10/17/10 183 lb (83.008 kg)   Constitutional: She appears well-developed and well-nourished. No distress.  Neck: Normal range of motion. Neck supple. No JVD present. No thyromegaly present.  Cardiovascular: Normal rate, regular rhythm and normal heart sounds.  No murmur heard. No BLE edema. Pulmonary/Chest: Effort normal and breath sounds normal. No respiratory distress. She has no wheezes.  Abdominal: Soft. Bowel sounds are normal. She exhibits no distension. There is no tenderness.  Psychiatric: She has a normal mood and affect. Her behavior is normal. Judgment and thought content normal.   Lab Results  Component Value Date   WBC 5.9 11/02/2008   HGB 14.1 11/02/2008   HCT 43.6 11/02/2008   PLT 207 11/02/2008   CHOL 312* 05/12/2011   TRIG 228.0* 05/12/2011   HDL 57.60 05/12/2011   LDLDIRECT 210.5 05/12/2011   ALT 31 11/02/2008   AST 29 11/02/2008   NA 142 11/02/2008   K 4.2 11/02/2008   CL 105 11/02/2008   CREATININE 0.8 04/04/2010   BUN 20 11/02/2008   CO2 23 11/02/2008   TSH 3.286 11/02/2008       Assessment & Plan:  See problem list. Medications and labs reviewed today.  Time spent with pt today 25 minutes, greater than 50% time spent counseling patient on lipids and non-rx treatment options, back/leg pain and medication review. Also review of prior records

## 2012-02-12 ENCOUNTER — Telehealth: Payer: Self-pay | Admitting: *Deleted

## 2012-02-12 NOTE — Telephone Encounter (Signed)
Left msg on vm saw md back in January mis-place information on which Intergrative services she recommended. Pls advise.... 02/12/12@4 :08pm/LMB

## 2012-02-13 NOTE — Telephone Encounter (Signed)
[facility] [address] [place], [place] [address] [phone]

## 2012-02-13 NOTE — Telephone Encounter (Signed)
Notified pt with md response... 02/13/12@11 :49am/LMB

## 2012-02-23 ENCOUNTER — Other Ambulatory Visit: Payer: Self-pay | Admitting: Internal Medicine

## 2012-03-13 ENCOUNTER — Other Ambulatory Visit: Payer: Self-pay | Admitting: Internal Medicine

## 2012-05-31 ENCOUNTER — Ambulatory Visit: Payer: Medicare Other | Admitting: Internal Medicine

## 2012-07-09 ENCOUNTER — Ambulatory Visit (INDEPENDENT_AMBULATORY_CARE_PROVIDER_SITE_OTHER): Payer: Medicare Other | Admitting: Internal Medicine

## 2012-07-09 ENCOUNTER — Encounter: Payer: Self-pay | Admitting: Internal Medicine

## 2012-07-09 VITALS — BP 118/68 | HR 77 | Temp 97.5°F | Ht 64.0 in | Wt 181.2 lb

## 2012-07-09 DIAGNOSIS — M5431 Sciatica, right side: Secondary | ICD-10-CM

## 2012-07-09 DIAGNOSIS — Z0000 Encounter for general adult medical examination without abnormal findings: Secondary | ICD-10-CM

## 2012-07-09 DIAGNOSIS — E785 Hyperlipidemia, unspecified: Secondary | ICD-10-CM

## 2012-07-09 DIAGNOSIS — M5430 Sciatica, unspecified side: Secondary | ICD-10-CM

## 2012-07-09 DIAGNOSIS — I10 Essential (primary) hypertension: Secondary | ICD-10-CM

## 2012-07-09 NOTE — Assessment & Plan Note (Signed)
BP Readings from Last 3 Encounters:  07/09/12 118/68  12/15/11 130/72  05/12/11 138/76   The current medical regimen is effective;  continue present plan and medications.

## 2012-07-09 NOTE — Assessment & Plan Note (Signed)
Ortho and PT tx for same spring 2012 - s/p 3 ESI in 2012 with improvement but not resolved Taking meloxicam qod with fair control Also ongoing chiropractor manipulation Not interested in "surgical options"

## 2012-07-09 NOTE — Progress Notes (Signed)
Subjective:    Patient ID: Virginia [name], female    DOB: 1941-09-06, 71 y.o.   MRN: [id number]  HPI  Here for medicare wellness  Diet: heart healthy Physical activity: sedentary Depression/mood screen: negative Hearing: intact to whispered voice Visual acuity: grossly normal, performs annual eye exam  ADLs: capable Fall risk: none Home safety: good Cognitive evaluation: intact to orientation, naming, recall and repetition EOL planning: adv directives, full code/ I agree  I have personally reviewed and have noted 1. The patient's medical and social history 2. Their use of alcohol, tobacco or illicit drugs 3. Their current medications and supplements 4. The patient's functional ability including ADL's, fall risks, home safety risks and hearing or visual impairment. 5. Diet and physical activities 6. Evidence for depression or mood disorders  Also reviewed chronic med issues:  RLE sciatica pain and low back pain  - dx 12/2010 - prior tx by [facility] - s/p 3 ESI in 2012 - rx'd meloxicam every other day with improvement, also massage - chiropractor services and PT onoging  HTN - reports compliance with ongoing medical treatment and no changes in medication dose or frequency. denies adverse side effects related to current therapy. no chest pain or headache    dyslipidemia - Reports non compliance with rx'd medical treatment but taking fenofibrate and omega 3. intol of statins in past (tried lipitor and crestor)   hx colon polyps 08/2004 - not due for repeat screening until 2015 per her report   allergic rhinitis - reports compliance with ongoing medical treatment and no changes in medication dose or frequency. denies adverse side effects related to current therapy.   GERD with dysphagia hx - s/p EGD with "stretching" 08/2009 - on PPI  -no regurgitation or vomitting, no dysphagia - no other PPI previously used as symptoms always well controlled with pantoprot   Past Medical  History  Diagnosis Date  . DIVERTICULITIS, HX OF   . FIBROIDS, UTERUS     s/p fibroidectomy  . 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 ALLERGIC RHINITIS   . OSTEOARTHRITIS   . GERD   . HYPERLIPIDEMIA     intol of statins (myalgia, thumb pain)  . HYPERTENSION    Family History  Problem Relation Age of Onset  . Hyperlipidemia Brother   . Arthritis Other     Parents  . Diabetes Other     Aunts  . Hyperlipidemia Cousin   . Heart disease Cousin     parent, grandparent   History  Substance Use Topics  . Smoking status: Never Smoker   . 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 Alcohol Use: No    Review of Systems  Constitutional: Negative for fever.  Respiratory: Negative for cough, shortness of breath and wheezing.   Cardiovascular: Negative for chest pain and palpitations.  Neurological: Negative for headaches.  Skin: Negative for rash.  No other specific complaints in a complete review of systems (except as listed in HPI above).      Objective:   Physical Exam  BP 118/68  Pulse 77  Temp 97.5 F (36.4 C) (Oral)  Ht 5\' 4"  (1.626 m)  Wt 181 lb 3.2 oz (82.192 kg)  BMI 31.10 kg/m2  SpO2 98% Wt Readings from Last 3 Encounters:  07/09/12 181 lb 3.2 oz (82.192 kg)  12/15/11 181 lb 12.8 oz (82.464 kg)  05/12/11 182 lb 12.8 oz (82.918 kg)  Constitutional: She appears well-developed and well-nourished. No distress.  Neck: Normal range of motion. Neck supple. No JVD present. No thyromegaly present.  Cardiovascular: Normal rate, regular rhythm and normal heart sounds.  No murmur heard. No BLE edema. Pulmonary/Chest: Effort normal and breath sounds normal. No respiratory distress. She has no wheezes.  Psychiatric: She has a normal mood and affect. Her behavior is normal. Judgment and thought content normal.   Lab Results  Component Value Date   WBC 5.9 11/02/2008   HGB 14.1 11/02/2008   HCT 43.6  11/02/2008   PLT 207 11/02/2008   CHOL 312* 05/12/2011   TRIG 228.0* 05/12/2011   HDL 57.60 05/12/2011   LDLDIRECT 210.5 05/12/2011   ALT 31 11/02/2008   AST 29 11/02/2008   NA 142 11/02/2008   K 4.2 11/02/2008   CL 105 11/02/2008   CREATININE 0.8 04/04/2010   BUN 20 11/02/2008   CO2 23 11/02/2008   TSH 3.286 11/02/2008       Assessment & Plan:  AWV/v70.0 - Today patient counseled on age appropriate routine health concerns for screening and prevention, each reviewed and up to date or declined. Immunizations reviewed and up to date or declined. Labs reviewed. Risk factors for depression reviewed and negative. Hearing function and visual acuity are intact. ADLs screened and addressed as needed. Functional ability and level of safety reviewed and appropriate. Education, counseling and referrals performed based on assessed risks today. Patient provided with a copy of personalized plan for preventive services.   Also see problem list. Medications and labs reviewed today.

## 2012-07-09 NOTE — Assessment & Plan Note (Signed)
Intolerant of statins (lipitor, crestor in prior tx trials) and reluctant to take niacin - uses fish oil and fenofibrate Education of diet/exercise for risk reduction - reviewed same again today Recheck FLP annually and as needed Consider Zetia if increasing LDL

## 2012-07-09 NOTE — Patient Instructions (Addendum)
It was good to see you today. We have reviewed your prior records including labs and tests today Medications reviewed, no changes at this time.  Test(s) ordered today. Return next week when you are fasting. Your results will be called to you after review (48-72hours after test completion). If any changes need to be made, you will be notified at that time. Please schedule followup in 6-12 months for blood pressure and cholesterol check, call sooner if problems. Continue to work on lifestyle changes as discussed (low fat, low carb, increased protein diet; improved exercise efforts; weight loss) to control sugar, blood pressure and cholesterol levels and/or reduce risk of developing other medical problems. Health Maintenance reviewed - all recommended immunizations and age-appropriate screenings are up-to-date. Health Maintenance, Females A healthy lifestyle and preventative care can promote health and wellness.  Maintain regular health, dental, and eye exams.   Eat a healthy diet. Foods like vegetables, fruits, whole grains, low-fat dairy products, and lean protein foods contain the nutrients you need without too many calories. Decrease your intake of foods high in solid fats, added sugars, and salt. Get information about a proper diet from your caregiver, if necessary.   Regular physical exercise is one of the most important things you can do for your health. Most adults should get at least 150 minutes of moderate-intensity exercise (any activity that increases your heart rate and causes you to sweat) each week. In addition, most adults need muscle-strengthening exercises on 2 or more days a week.     Maintain a healthy weight. The body mass index (BMI) is a screening tool to identify possible weight problems. It provides an estimate of body fat based on height and weight. Your caregiver can help determine your BMI, and can help you achieve or maintain a healthy weight. For adults 20 years and older:     A BMI below 18.5 is considered underweight.   A BMI of 18.5 to 24.9 is normal.   A BMI of 25 to 29.9 is considered overweight.   A BMI of 30 and above is considered obese.   Maintain normal blood lipids and cholesterol by exercising and minimizing your intake of saturated fat. Eat a balanced diet with plenty of fruits and vegetables. Blood tests for lipids and cholesterol should begin at age 56 and be repeated every 5 years. If your lipid or cholesterol levels are high, you are over 50, or you are a high risk for heart disease, you may need your cholesterol levels checked more frequently. Ongoing high lipid and cholesterol levels should be treated with medicines if diet and exercise are not effective.   If you smoke, find out from your caregiver how to quit. If you do not use tobacco, do not start.   If you are pregnant, do not drink alcohol. If you are breastfeeding, be very cautious about drinking alcohol. If you are not pregnant and choose to drink alcohol, do not exceed 1 drink per day. One drink is considered to be 12 ounces (355 mL) of beer, 5 ounces (148 mL) of wine, or 1.5 ounces (44 mL) of liquor.   Avoid use of street drugs. Do not share needles with anyone. Ask for help if you need support or instructions about stopping the use of drugs.   High blood pressure causes heart disease and increases the risk of stroke. Blood pressure should be checked at least every 1 to 2 years. Ongoing high blood pressure should be treated with medicines, if weight loss  and exercise are not effective.   If you are 65 to 69 years old, ask your caregiver if you should take aspirin to prevent strokes.   Diabetes screening involves taking a blood sample to check your fasting blood sugar level. This should be done once every 3 years, after age 100, if you are within normal weight and without risk factors for diabetes. Testing should be considered at a younger age or be carried out more frequently if you are  overweight and have at least 1 risk factor for diabetes.   Breast cancer screening is essential preventative care for women. You should practice "breast self-awareness." This means understanding the normal appearance and feel of your breasts and may include breast self-examination. Any changes detected, no matter how small, should be reported to a caregiver. Women in their 75s and 30s should have a clinical breast exam (CBE) by a caregiver as part of a regular health exam every 1 to 3 years. After age 29, women should have a CBE every year. Starting at age 5, women should consider having a mammogram (breast X-ray) every year. Women who have a family history of breast cancer should talk to their caregiver about genetic screening. Women at a high risk of breast cancer should talk to their caregiver about having an MRI and a mammogram every year.   The Pap test is a screening test for cervical cancer. Women should have a Pap test starting at age 48. Between ages 30 and 26, Pap tests should be repeated every 2 years. Beginning at age 30, you should have a Pap test every 3 years as long as the past 3 Pap tests have been normal. If you had a hysterectomy for a problem that was not cancer or a condition that could lead to cancer, then you no longer need Pap tests. If you are between ages 74 and 59, and you have had normal Pap tests going back 10 years, you no longer need Pap tests. If you have had past treatment for cervical cancer or a condition that could lead to cancer, you need Pap tests and screening for cancer for at least 20 years after your treatment. If Pap tests have been discontinued, risk factors (such as a new sexual partner) need to be reassessed to determine if screening should be resumed. Some women have medical problems that increase the chance of getting cervical cancer. In these cases, your caregiver may recommend more frequent screening and Pap tests.   The human papillomavirus (HPV) test is an  additional test that may be used for cervical cancer screening. The HPV test looks for the virus that can cause the cell changes on the cervix. The cells collected during the Pap test can be tested for HPV. The HPV test could be used to screen women aged 28 years and older, and should be used in women of any age who have unclear Pap test results. After the age of 23, women should have HPV testing at the same frequency as a Pap test.   Colorectal cancer can be detected and often prevented. Most routine colorectal cancer screening begins at the age of 53 and continues through age 65. However, your caregiver may recommend screening at an earlier age if you have risk factors for colon cancer. On a yearly basis, your caregiver may provide home test kits to check for hidden blood in the stool. Use of a small camera at the end of a tube, to directly examine the colon (sigmoidoscopy or  colonoscopy), can detect the earliest forms of colorectal cancer. Talk to your caregiver about this at age 44, when routine screening begins. Direct examination of the colon should be repeated every 5 to 10 years through age 22, unless early forms of pre-cancerous polyps or small growths are found.   Hepatitis C blood testing is recommended for all people born from [birth year] through 1963 and any individual with known risks for hepatitis C.   Practice safe sex. Use condoms and avoid high-risk sexual practices to reduce the spread of sexually transmitted infections (STIs). Sexually active women aged 4 and younger should be checked for Chlamydia, which is a common sexually transmitted infection. Older women with new or multiple partners should also be tested for Chlamydia. Testing for other STIs is recommended if you are sexually active and at increased risk.   Osteoporosis is a disease in which the bones lose minerals and strength with aging. This can result in serious bone fractures. The risk of osteoporosis can be identified using a bone  density scan. Women ages 26 and over and women at risk for fractures or osteoporosis should discuss screening with their caregivers. Ask your caregiver whether you should be taking a calcium supplement or vitamin D to reduce the rate of osteoporosis.   Menopause can be associated with physical symptoms and risks. Hormone replacement therapy is available to decrease symptoms and risks. You should talk to your caregiver about whether hormone replacement therapy is right for you.   Use sunscreen with a sun protection factor (SPF) of 30 or greater. Apply sunscreen liberally and repeatedly throughout the day. You should seek shade when your shadow is shorter than you. Protect yourself by wearing long sleeves, pants, a wide-brimmed hat, and sunglasses year round, whenever you are outdoors.   Notify your caregiver of new moles or changes in moles, especially if there is a change in shape or color. Also notify your caregiver if a mole is larger than the size of a pencil eraser.   Stay current with your immunizations.  Document Released: 06/02/2011 Document Revised: 11/06/2011 Document Reviewed: 06/02/2011 [facility] Patient Information 2012 [place], [place].

## 2012-07-10 ENCOUNTER — Encounter (HOSPITAL_BASED_OUTPATIENT_CLINIC_OR_DEPARTMENT_OTHER): Payer: Self-pay | Admitting: Emergency Medicine

## 2012-07-10 ENCOUNTER — Emergency Department (HOSPITAL_BASED_OUTPATIENT_CLINIC_OR_DEPARTMENT_OTHER)
Admission: EM | Admit: 2012-07-10 | Discharge: 2012-07-10 | Disposition: A | Discharge status: Home / Self Care | Payer: Medicare Other | Attending: Emergency Medicine | Admitting: Emergency Medicine

## 2012-07-10 DIAGNOSIS — I10 Essential (primary) hypertension: Secondary | ICD-10-CM | POA: Insufficient documentation

## 2012-07-10 DIAGNOSIS — IMO0002 Reserved for concepts with insufficient information to code with codable children: Secondary | ICD-10-CM | POA: Insufficient documentation

## 2012-07-10 DIAGNOSIS — K219 Gastro-esophageal reflux disease without esophagitis: Secondary | ICD-10-CM | POA: Insufficient documentation

## 2012-07-10 DIAGNOSIS — M1990 Unspecified osteoarthritis, unspecified site: Secondary | ICD-10-CM | POA: Insufficient documentation

## 2012-07-10 DIAGNOSIS — E785 Hyperlipidemia, unspecified: Secondary | ICD-10-CM | POA: Insufficient documentation

## 2012-07-10 DIAGNOSIS — T18108A Unspecified foreign body in esophagus causing other injury, initial encounter: Secondary | ICD-10-CM | POA: Insufficient documentation

## 2012-07-10 MED ORDER — NITROGLYCERIN 0.4 MG SL SUBL
0.4000 mg | SUBLINGUAL_TABLET | Freq: Once | SUBLINGUAL | Status: AC
  Administered 2012-07-10: 0.4 mg via SUBLINGUAL
  Filled 2012-07-10: qty 25

## 2012-07-10 NOTE — ED Provider Notes (Signed)
History  This chart was scribed for Virginia Bucco, MD by Ladona Ridgel Day. This patient was seen in room MH06/MH06 and the patient's care was started at 1607.   CSN: [id number]  Arrival date & time 07/10/12  1607   First MD Initiated Contact with Patient 07/10/12 1809      Chief Complaint  Patient presents with  . Dysphagia    The history is provided by the patient. No language interpreter was used.   Virginia [name] is a 71 y.o. female who presents to the Emergency Department complaining of constant dysphagia since last PM. She states it occurred after the first bite of her dinner where she had fish and fried squash and now it feels like there is food stuck. She states it feels the food is stuck around her mid sternal chest area. She states that she has had a similar episode 3 years ago and her symptoms resolved after using sublingual NTG. She states that she currently cannot swallow anything and spits up after drinking liquids. She has not had any emesis episodes. She denies any SOB, cough/congestion, and fever. She has no GI doctor in town and her PCP is Dr. Tamala Fothergill [facility]. She denies any other injuries or illnesses.  Has had esophageal dilatations x 3 in [facility]  Past Medical History  Diagnosis Date  . DIVERTICULITIS, HX OF   . FIBROIDS, UTERUS     s/p fibroidectomy  . 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 ALLERGIC RHINITIS   . OSTEOARTHRITIS   . GERD   . HYPERLIPIDEMIA     intol of statins (myalgia, thumb pain)  . HYPERTENSION     Past Surgical History  Procedure Date  . Child birth 75 & 26  . Low back surgery 01/1994    2 bulging disc  . Tonsillectomy 1968  . Fibroidectomy     Family History  Problem Relation Age of Onset  . Hyperlipidemia Brother   . Arthritis Other     Parents  . Diabetes Other     Aunts  . Hyperlipidemia Cousin   . Heart disease Cousin     parent, grandparent    History  Substance Use Topics  . Smoking status: Never  Smoker   . 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 Alcohol Use: No    OB History    Grav Para Term Preterm Abortions TAB SAB Ect Mult Living                  Review of Systems  Constitutional: Negative for fever, chills, diaphoresis and fatigue.  HENT: Positive for trouble swallowing (Cannot swallow anything since last PM ). Negative for congestion, rhinorrhea and sneezing.   Eyes: Negative.   Respiratory: Negative for cough, chest tightness and shortness of breath.   Cardiovascular: Negative for chest pain and leg swelling.  Gastrointestinal: Negative for nausea, vomiting, abdominal pain, diarrhea and blood in stool.  Genitourinary: Negative for frequency, hematuria, flank pain and difficulty urinating.  Musculoskeletal: Negative for back pain and arthralgias.  Skin: Negative for rash.  Neurological: Negative for dizziness, speech difficulty, weakness, numbness and headaches.  All other systems reviewed and are negative.    Allergies  Penicillins and Statins  Home Medications   Current Outpatient Rx  Name Route Sig Dispense Refill  . ASPIRIN 81 MG PO TABS Oral Take 81 mg by mouth daily.      Marland Kitchen  LORATADINE 10 MG PO TABS Oral Take 10 mg by mouth daily.    . MELOXICAM 15 MG PO TABS Oral Take 15 mg by mouth daily as needed. For pain    . METOPROLOL TARTRATE 50 MG PO TABS  TAKE 1 TABLET TWICE A DAY 180 tablet 1  . FISH OIL 1000 MG PO CAPS Oral Take 1,000-2,000 mg by mouth 2 (two) times daily. 2000 mg in the morning and 1000 mg at night    . PANTOPRAZOLE SODIUM 40 MG PO TBEC Oral Take 40 mg by mouth daily as needed. For reflux    . TRILIPIX 135 MG PO CPDR  TAKE 1 CAPSULE DAILY 90 capsule 1    Triage Vitals: BP 166/84  Pulse 70  Temp 97.8 F (36.6 C) (Oral)  Resp 16  SpO2 100%  Physical Exam  Nursing note and vitals reviewed. Constitutional: She is oriented to person, place, and time. She appears well-developed  and well-nourished.  HENT:  Head: Normocephalic and atraumatic.  Eyes: Pupils are equal, round, and reactive to light.  Neck: Normal range of motion. Neck supple.  Cardiovascular: Normal rate, regular rhythm and normal heart sounds.   Pulmonary/Chest: Effort normal and breath sounds normal. No respiratory distress. She has no wheezes. She has no rales. She exhibits no tenderness.  Abdominal: Soft. Bowel sounds are normal. There is no tenderness. There is no rebound and no guarding.  Musculoskeletal: Normal range of motion. She exhibits no edema.  Lymphadenopathy:    She has no cervical adenopathy.  Neurological: She is alert and oriented to person, place, and time.  Skin: Skin is warm and dry. No rash noted.  Psychiatric: She has a normal mood and affect.    ED Course  Procedures (including critical care time) DIAGNOSTIC STUDIES: Oxygen Saturation is 100% on room air, normal by my interpretation.    COORDINATION OF CARE: At 623 PM Discussed treatment plan with patient which includes sublingual NTG. Patient agrees.   Labs Reviewed - No data to display No results found.   1. Esophageal foreign body       MDM  Pt was given ntg SL x 1 which seems to have relieved the food impaction.  Pt is now swallowing normally and tolerating water and applesauce.  Will refer to f/u with [facility]         Virginia Bucco, MD 07/10/12 2029

## 2012-07-10 NOTE — ED Notes (Signed)
Pt c/o food stuck & unable to swallow since last pm; similar episodes in the past, w/ dilation x 3

## 2012-07-16 ENCOUNTER — Other Ambulatory Visit (INDEPENDENT_AMBULATORY_CARE_PROVIDER_SITE_OTHER): Payer: Medicare Other

## 2012-07-16 DIAGNOSIS — E785 Hyperlipidemia, unspecified: Secondary | ICD-10-CM

## 2012-07-16 LAB — LIPID PANEL
Cholesterol: 227 mg/dL — ABNORMAL HIGH (ref 0–200)
HDL: 46.8 mg/dL (ref >39.00)
Total CHOL/HDL Ratio: 5
Triglycerides: 179 mg/dL — ABNORMAL HIGH (ref 0.0–149.0)
VLDL: 35.8 mg/dL (ref 0.0–40.0)

## 2012-08-16 ENCOUNTER — Encounter: Payer: Self-pay | Admitting: Internal Medicine

## 2012-08-24 ENCOUNTER — Other Ambulatory Visit: Payer: Self-pay | Admitting: Internal Medicine

## 2012-09-10 ENCOUNTER — Other Ambulatory Visit: Payer: Self-pay | Admitting: Internal Medicine

## 2013-01-31 ENCOUNTER — Ambulatory Visit (INDEPENDENT_AMBULATORY_CARE_PROVIDER_SITE_OTHER): Payer: 59 | Admitting: Internal Medicine

## 2013-01-31 ENCOUNTER — Encounter: Payer: Self-pay | Admitting: Internal Medicine

## 2013-01-31 VITALS — BP 128/72 | HR 72 | Temp 97.9°F | Wt 178.8 lb

## 2013-01-31 DIAGNOSIS — E785 Hyperlipidemia, unspecified: Secondary | ICD-10-CM

## 2013-01-31 DIAGNOSIS — I10 Essential (primary) hypertension: Secondary | ICD-10-CM

## 2013-01-31 DIAGNOSIS — M5430 Sciatica, unspecified side: Secondary | ICD-10-CM

## 2013-01-31 DIAGNOSIS — K219 Gastro-esophageal reflux disease without esophagitis: Secondary | ICD-10-CM

## 2013-01-31 DIAGNOSIS — M5431 Sciatica, right side: Secondary | ICD-10-CM

## 2013-01-31 MED ORDER — MELOXICAM 15 MG PO TABS: 15.0000 mg | ORAL_TABLET | Freq: Every day | ORAL | Status: DC

## 2013-01-31 NOTE — Progress Notes (Signed)
Subjective:    Patient ID: Virginia [name], female    DOB: 1941-10-13, 72 y.o.   MRN: [id number]  HPI Here today for 6 month follow up of chronic medical conditions. The patient has no new complaints today.   Problems reviewed today were:  Sciatic nerve pain (Right lower leg) - The patient started Silver Sneakers program at the [facility] at the beginning of the year and noticed her sciatica was exacerbated by some of the exercises. The pain radiated down into her right foot, specifically the heel. She has since discontinued the program. She is going to a chiropractor and states that is helping her pain. She is also taking meloxicam every three to four days as needed for pain relief and says this has helped.   Hypertension- Patient states her blood pressure is generally good. She is not checking her blood pressure at home. She is taking medications as prescribed and denies side effects.   Hyerlipedemia- Patient is taking medication as prescribed and denies any side effects. Fenofibrate is drug of choice as patient was previously intolerant of statins.    Obesity-  The patient has lost an insignificant amount of weight since last visit. She is trying to control portion sizes. Her pervious exercise with the Silver Sneakers program at the [facility] but d/c exercise due to sciatica exacerbation. She is thinking about starting a swim program instead.   GERD- Patient experiences reflux episodes sometimes. She says they are worse when she goes out to eat. She does not take the pantoprazole daily, only as needed before going out for meals. She states this method controls her symptoms.   Review of Systems Past Medical History  Diagnosis Date  . DIVERTICULITIS, HX OF   . FIBROIDS, UTERUS     s/p fibroidectomy  . 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 ALLERGIC RHINITIS   . OSTEOARTHRITIS   . GERD   . HYPERLIPIDEMIA     intol of statins (myalgia, thumb pain)  . HYPERTENSION         Objective:   Physical Exam  Constitutional: She appears well-developed and well-nourished. No distress.  HENT:  Head: Normocephalic and atraumatic.  Eyes: Conjunctivae are normal. Pupils are equal, round, and reactive to light. Right eye exhibits no discharge. Left eye exhibits no discharge. No scleral icterus.  Neck: Normal range of motion. Neck supple. No thyromegaly present.  Cardiovascular: Normal rate, regular rhythm and normal heart sounds.  Exam reveals no gallop and no friction rub.   No murmur heard. Pulmonary/Chest: Effort normal and breath sounds normal. No respiratory distress. She has no wheezes.  Musculoskeletal: She exhibits no edema.  Psychiatric: She has a normal mood and affect. Her behavior is normal. Judgment and thought content normal.   Filed Vitals:   01/31/13 1046  BP: 128/72  Pulse: 72  Temp: 97.9 F (36.6 C)     Lab Results  Component Value Date   WBC 5.9 11/02/2008   HGB 14.1 11/02/2008   HCT 43.6 11/02/2008   PLT 207 11/02/2008   GLUCOSE 92 04/04/2010   CHOL 227* 07/16/2012   TRIG 179.0* 07/16/2012   HDL 46.80 07/16/2012   LDLDIRECT 160.0 07/16/2012   LDLCALC 159 11/02/2008   ALT 31 11/02/2008   AST 29 11/02/2008   NA 142 11/02/2008   K 4.2 11/02/2008   CL 105 11/02/2008   CREATININE 0.8 04/04/2010   BUN 20 11/02/2008   CO2 23 11/02/2008   TSH 3.286 11/02/2008  Assessment & Plan:  Sciatic nerve pain- Meloxicam daily for two weeks to calm inflammation and prn for pain thereafter. Recheck as needed.  HTN- Continue current medications. Will recheck in 6 months.  Hyperlidemia- Continue current medications. Will recheck labs in 6 months.  Obesity- Lifestyle change encouraged, specifically portion control and swimming exercises. Will continue to monitor weight at each return visit. Next scheduled visit in 6 months.   Tomeshia Pizzi PA-S GERD-   I have personally reviewed this case with PA student. I also personally examined this patient. I agree with  history and findings as documented above. I reviewed, discussed and approve of the assessment and plan as listed above. Rene Paci, MD

## 2013-01-31 NOTE — Assessment & Plan Note (Signed)
BP Readings from Last 3 Encounters:  01/31/13 128/72  07/10/12 166/84  07/09/12 118/68   The current medical regimen is effective;  continue present plan and medications.

## 2013-01-31 NOTE — Patient Instructions (Signed)
It was good to see you today. We have reviewed your prior records including labs and tests today Medications reviewed, no changes at this time. Refill on medication(s) as discussed today. Please schedule followup in 6 months for blood pressure and cholesterol check, call sooner if problems. Continue to work on lifestyle changes as discussed (low fat, low carb, increased protein diet; improved exercise efforts; weight loss) to control sugar, blood pressure and cholesterol levels and/or reduce risk of developing other medical problems.  Back Exercises Back exercises help treat and prevent back injuries. The goal is to increase your strength in your belly (abdominal) and back muscles. These exercises can also help with flexibility. Start these exercises when told by your doctor. HOME CARE Back exercises include: Pelvic Tilt.  Lie on your back with your knees bent. Tilt your pelvis until the lower part of your back is against the floor. Hold this position 5 to 10 sec. Repeat this exercise 5 to 10 times. Knee to Chest.  Pull 1 knee up against your chest and hold for 20 to 30 seconds. Repeat this with the other knee. This may be done with the other leg straight or bent, whichever feels better. Then, pull both knees up against your chest. Sit-Ups or Curl-Ups.  Bend your knees 90 degrees. Start with tilting your pelvis, and do a partial, slow sit-up. Only lift your upper half 30 to 45 degrees off the floor. Take at least 2 to 3 seonds for each sit-up. Do not do sit-ups with your knees out straight. If partial sit-ups are difficult, simply do the above but with only tightening your belly (abdominal) muscles and holding it as told. Hip-Lift.  Lie on your back with your knees flexed 90 degrees. Push down with your feet and shoulders as you raise your hips 2 inches off the floor. Hold for 10 seconds, repeat 5 to 10 times. Back Arches.  Lie on your stomach. Prop yourself up on bent elbows. Slowly press on  your hands, causing an arch in your low back. Repeat 3 to 5 times. Shoulder-Lifts.  Lie face down with arms beside your body. Keep hips and belly pressed to floor as you slowly lift your head and shoulders off the floor. Do not overdo your exercises. Be careful in the beginning. Exercises may cause you some mild back discomfort. If the pain lasts for more than 15 minutes, stop the exercises until you see your doctor. Improvement with exercise for back problems is slow.  Document Released: 12/20/2010 Document Revised: 02/09/2012 Document Reviewed: 09/18/2011 [facility] Patient Information 2013 [place], [place]. Back Exercises Back exercises help treat and prevent back injuries. The goal is to increase your strength in your belly (abdominal) and back muscles. These exercises can also help with flexibility. Start these exercises when told by your doctor. HOME CARE Back exercises include: Pelvic Tilt.  Lie on your back with your knees bent. Tilt your pelvis until the lower part of your back is against the floor. Hold this position 5 to 10 sec. Repeat this exercise 5 to 10 times. Knee to Chest.  Pull 1 knee up against your chest and hold for 20 to 30 seconds. Repeat this with the other knee. This may be done with the other leg straight or bent, whichever feels better. Then, pull both knees up against your chest. Sit-Ups or Curl-Ups.  Bend your knees 90 degrees. Start with tilting your pelvis, and do a partial, slow sit-up. Only lift your upper half 30 to 45 degrees  off the floor. Take at least 2 to 3 seonds for each sit-up. Do not do sit-ups with your knees out straight. If partial sit-ups are difficult, simply do the above but with only tightening your belly (abdominal) muscles and holding it as told. Hip-Lift.  Lie on your back with your knees flexed 90 degrees. Push down with your feet and shoulders as you raise your hips 2 inches off the floor. Hold for 10 seconds, repeat 5 to 10 times. Back  Arches.  Lie on your stomach. Prop yourself up on bent elbows. Slowly press on your hands, causing an arch in your low back. Repeat 3 to 5 times. Shoulder-Lifts.  Lie face down with arms beside your body. Keep hips and belly pressed to floor as you slowly lift your head and shoulders off the floor. Do not overdo your exercises. Be careful in the beginning. Exercises may cause you some mild back discomfort. If the pain lasts for more than 15 minutes, stop the exercises until you see your doctor. Improvement with exercise for back problems is slow.  Document Released: 12/20/2010 Document Revised: 02/09/2012 Document Reviewed: 09/18/2011 [facility] Patient Information 2013 [place], [place].

## 2013-01-31 NOTE — Assessment & Plan Note (Signed)
On PPI for same - symptoms well controlled - The current medical regimen is effective;  continue present plan and medications.  follow up GI as needed

## 2013-01-31 NOTE — Progress Notes (Signed)
  Subjective:    Patient ID: Virginia [name], female    DOB: [birth year], 72 y.o.   MRN: [id number]  HPI  Here for follow up - reviewed chronic med issues:  RLE sciatica pain and low back pain  - dx 12/2010 - prior tx by [facility] - s/p 3 ESI in 2012 - rx'd meloxicam every other day with improvement, also massage - chiropractor services and PT onoging  hypertension - reports compliance with ongoing medical treatment and no changes in medication dose or frequency. denies adverse side effects related to current therapy. no chest pain or headache    dyslipidemia - Reports non compliance with rx'd statin treatment but taking fenofibrate and omega 3. intol of statins in past (tried lipitor and crestor)   hx colon polyps 08/2004 - not due for repeat screening until 2015 per her report   allergic rhinitis - reports compliance with ongoing medical treatment and no changes in medication dose or frequency. denies adverse side effects related to current therapy.   GERD with dysphagia hx - s/p EGD with "stretching" 08/2009 - on PPI  -no regurgitation or vomitting, no dysphagia - takes PPI as needed only   Past Medical History  Diagnosis Date  . DIVERTICULITIS, HX OF   . FIBROIDS, UTERUS     s/p fibroidectomy  . 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 ALLERGIC RHINITIS   . OSTEOARTHRITIS   . GERD   . HYPERLIPIDEMIA     intol of statins (myalgia, thumb pain)  . HYPERTENSION     Review of Systems  Constitutional: Negative for fever.  Respiratory: Negative for cough, shortness of breath and wheezing.   Cardiovascular: Negative for chest pain and palpitations.  Neurological: Negative for headaches.      Objective:   Physical Exam  BP 128/72  Pulse 72  Temp(Src) 97.9 F (36.6 C) (Oral)  Wt 178 lb 12.8 oz (81.103 kg)  BMI 30.68 kg/m2  SpO2 97% Wt Readings from Last 3 Encounters:  01/31/13 178 lb 12.8 oz (81.103 kg)  07/09/12 181 lb 3.2 oz (82.192 kg)  12/15/11 181 lb  12.8 oz (82.464 kg)   Constitutional: She appears well-developed and well-nourished. No distress.  Neck: Normal range of motion. Neck supple. No JVD present. No thyromegaly present.  Cardiovascular: Normal rate, regular rhythm and normal heart sounds.  No murmur heard. No BLE edema. Pulmonary/Chest: Effort normal and breath sounds normal. No respiratory distress. She has no wheezes.  Psychiatric: She has a normal mood and affect. Her behavior is normal. Judgment and thought content normal.   Lab Results  Component Value Date   WBC 5.9 11/02/2008   HGB 14.1 11/02/2008   HCT 43.6 11/02/2008   PLT 207 11/02/2008   CHOL 227* 07/16/2012   TRIG 179.0* 07/16/2012   HDL 46.80 07/16/2012   LDLDIRECT 160.0 07/16/2012   ALT 31 11/02/2008   AST 29 11/02/2008   NA 142 11/02/2008   K 4.2 11/02/2008   CL 105 11/02/2008   CREATININE 0.8 04/04/2010   BUN 20 11/02/2008   CO2 23 11/02/2008   TSH 3.286 11/02/2008       Assessment & Plan:  see problem list. Medications and labs reviewed today.   Time spent with pt today 25 minutes, greater than 50% time spent counseling patient on hypertension, lipids and sciatica + medication review. Also review of prior records

## 2013-01-31 NOTE — Assessment & Plan Note (Signed)
Ortho and PT tx for same spring 2012 - s/p 3 ESI in 2012 with improvement but not resolved Taking meloxicam qod with fair control - encouraged to take qd x 2 weeks, then prn Also ongoing chiropractor manipulation Not interested in "surgical options" or repeat ESI but agrees to call if symptoms worse or uncontrolled with medical therapy

## 2013-01-31 NOTE — Assessment & Plan Note (Signed)
Intolerant of statins (lipitor, crestor in prior tx trials) and reluctant to take niacin - uses fish oil and fenofibrate Education of diet/exercise for risk reduction - reviewed same again today Recheck FLP annually

## 2013-05-11 ENCOUNTER — Other Ambulatory Visit: Payer: Self-pay | Admitting: Internal Medicine

## 2013-06-13 ENCOUNTER — Other Ambulatory Visit: Payer: Self-pay | Admitting: Internal Medicine

## 2013-07-21 ENCOUNTER — Encounter: Payer: Self-pay | Admitting: Internal Medicine

## 2013-07-21 LAB — HM MAMMOGRAPHY

## 2013-07-29 ENCOUNTER — Encounter: Payer: Self-pay | Admitting: Internal Medicine

## 2013-08-10 ENCOUNTER — Encounter: Payer: Self-pay | Admitting: Internal Medicine

## 2013-08-10 ENCOUNTER — Ambulatory Visit (INDEPENDENT_AMBULATORY_CARE_PROVIDER_SITE_OTHER): Payer: 59 | Admitting: Internal Medicine

## 2013-08-10 VITALS — BP 140/80 | HR 77 | Temp 98.4°F | Wt 183.4 lb

## 2013-08-10 DIAGNOSIS — I10 Essential (primary) hypertension: Secondary | ICD-10-CM

## 2013-08-10 DIAGNOSIS — M549 Dorsalgia, unspecified: Secondary | ICD-10-CM

## 2013-08-10 DIAGNOSIS — M65839 Other synovitis and tenosynovitis, unspecified forearm: Secondary | ICD-10-CM

## 2013-08-10 DIAGNOSIS — M659 Synovitis and tenosynovitis, unspecified: Secondary | ICD-10-CM

## 2013-08-10 DIAGNOSIS — E785 Hyperlipidemia, unspecified: Secondary | ICD-10-CM

## 2013-08-10 MED ORDER — FENOFIBRIC ACID 135 MG PO CPDR: 135.0000 mg | DELAYED_RELEASE_CAPSULE | Freq: Every day | ORAL | Status: DC

## 2013-08-10 MED ORDER — L-ARGININE 1000 MG PO TABS: 1.0000 | ORAL_TABLET | Freq: Every day | ORAL | Status: DC

## 2013-08-10 NOTE — Assessment & Plan Note (Signed)
Ortho and PT tx for same spring 2012 - s/p 3 ESI in 2012 with improvement but not resolved Previously associated with RLE sciatica which is currently improved Taking meloxicam prn with fair control - encouraged to take qd x 2 weeks, then prn Also ongoing chiropractor manipulation Not interested in "surgical options" or repeat ESI but agrees to call if symptoms worse or uncontrolled with medical therapy Also encouraged to consider PT - pt declines need for same at this time

## 2013-08-10 NOTE — Progress Notes (Signed)
  Subjective:    Patient ID: Virginia [name], female    DOB: Nov 05, 1941, 72 y.o.   MRN: [id number]  HPI Here for follow up - reviewed chronic med issues:  low back pain  -RLE sciatica pain dx 12/2010 - prior tx by [facility] - s/p 3 ESI in 2012 - rx'd meloxicam with improvement but not taking regularly, also massage - chiropractor services onoging  hypertension - reports compliance with ongoing medical treatment and no changes in medication dose or frequency. denies adverse side effects related to current therapy. no chest pain or headache    dyslipidemia - Reports non compliance with rx'd statin treatment but taking fenofibrate and omega 3. intol of statins in past (tried lipitor and crestor)   hx colon polyps 08/2004 - not due for repeat screening until 2015 per her report   allergic rhinitis - reports compliance with ongoing medical treatment and no changes in medication dose or frequency. denies adverse side effects related to current therapy.   GERD with dysphagia hx - s/p EGD with "stretching" 08/2009 and (2013 in [facility] tx with [facility]) - on PPI  -no regurgitation or vomitting, no dysphagia - takes PPI as needed only   Past Medical History  Diagnosis Date  . DIVERTICULITIS, HX OF   . FIBROIDS, UTERUS     s/p fibroidectomy  . 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 ALLERGIC RHINITIS   . OSTEOARTHRITIS   . GERD   . HYPERLIPIDEMIA     intol of statins (myalgia, thumb pain)  . HYPERTENSION     Review of Systems  Constitutional: Negative for fever.  Respiratory: Negative for cough, shortness of breath and wheezing.   Cardiovascular: Negative for chest pain and palpitations.  Neurological: Negative for headaches.      Objective:   Physical Exam BP 140/80  Pulse 77  Temp(Src) 98.4 F (36.9 C) (Oral)  Wt 183 lb 6.4 oz (83.19 kg)  BMI 31.47 kg/m2  SpO2 97% Wt Readings from Last 3 Encounters:  08/10/13 183 lb 6.4 oz (83.19 kg)  01/31/13 178 lb 12.8 oz (81.103 kg)   07/09/12 181 lb 3.2 oz (82.192 kg)   Constitutional: She is overweight, but appears well-developed and well-nourished. No distress.  Neck: Normal range of motion. Neck supple. No JVD present. No thyromegaly present.  Cardiovascular: Normal rate, regular rhythm and normal heart sounds.  No murmur heard. No BLE edema. Pulmonary/Chest: Effort normal and breath sounds normal. No respiratory distress. She has no wheezes.  Psychiatric: She has a normal mood and affect. Her behavior is normal. Judgment and thought content normal.   Lab Results  Component Value Date   WBC 5.9 11/02/2008   HGB 14.1 11/02/2008   HCT 43.6 11/02/2008   PLT 207 11/02/2008   CHOL 227* 07/16/2012   TRIG 179.0* 07/16/2012   HDL 46.80 07/16/2012   LDLDIRECT 160.0 07/16/2012   ALT 31 11/02/2008   AST 29 11/02/2008   NA 142 11/02/2008   K 4.2 11/02/2008   CL 105 11/02/2008   CREATININE 0.8 04/04/2010   BUN 20 11/02/2008   CO2 23 11/02/2008   TSH 3.286 11/02/2008       Assessment & Plan:  see problem list. Medications and labs reviewed today.  R 4th trigger finger - education provided - pt declines need for injection at this time as no locking or increasing pain - conservative care with ice, NSAIDs and "double BandAid qhs splint

## 2013-08-10 NOTE — Assessment & Plan Note (Signed)
Intolerant of statins (lipitor, crestor in prior tx trials) and reluctant to take niacin - uses fish oil and fenofibrate Education of diet/exercise for risk reduction - reviewed same again today Recheck FLP annually

## 2013-08-10 NOTE — Assessment & Plan Note (Signed)
BP Readings from Last 3 Encounters:  08/10/13 140/80  01/31/13 128/72  07/10/12 166/84   The current medical regimen is effective;  continue present plan and medications.

## 2013-08-10 NOTE — Patient Instructions (Signed)
It was good to see you today. We have reviewed your prior records including labs and tests today Medications reviewed, no changes at this time. Refill on medication(s) as discussed today. Test(s) ordered today. Your results will be released to MyChart (or called to you) after review, usually within 72hours after test completion. If any changes need to be made, you will be notified at that same time. Please schedule followup in 6 months for blood pressure and cholesterol check, call sooner if problems. Continue to work on lifestyle changes as discussed (low fat, low carb, increased protein diet; improved exercise efforts; weight loss) to control sugar, blood pressure and cholesterol levels and/or reduce risk of developing other medical problems.  Back Exercises Back exercises help treat and prevent back injuries. The goal is to increase your strength in your belly (abdominal) and back muscles. These exercises can also help with flexibility. Start these exercises when told by your doctor. HOME CARE Back exercises include: Pelvic Tilt.  Lie on your back with your knees bent. Tilt your pelvis until the lower part of your back is against the floor. Hold this position 5 to 10 sec. Repeat this exercise 5 to 10 times. Knee to Chest.  Pull 1 knee up against your chest and hold for 20 to 30 seconds. Repeat this with the other knee. This may be done with the other leg straight or bent, whichever feels better. Then, pull both knees up against your chest. Sit-Ups or Curl-Ups.  Bend your knees 90 degrees. Start with tilting your pelvis, and do a partial, slow sit-up. Only lift your upper half 30 to 45 degrees off the floor. Take at least 2 to 3 seonds for each sit-up. Do not do sit-ups with your knees out straight. If partial sit-ups are difficult, simply do the above but with only tightening your belly (abdominal) muscles and holding it as told. Hip-Lift.  Lie on your back with your knees flexed 90 degrees.  Push down with your feet and shoulders as you raise your hips 2 inches off the floor. Hold for 10 seconds, repeat 5 to 10 times. Back Arches.  Lie on your stomach. Prop yourself up on bent elbows. Slowly press on your hands, causing an arch in your low back. Repeat 3 to 5 times. Shoulder-Lifts.  Lie face down with arms beside your body. Keep hips and belly pressed to floor as you slowly lift your head and shoulders off the floor. Do not overdo your exercises. Be careful in the beginning. Exercises may cause you some mild back discomfort. If the pain lasts for more than 15 minutes, stop the exercises until you see your doctor. Improvement with exercise for back problems is slow.  Document Released: 12/20/2010 Document Revised: 02/09/2012 Document Reviewed: 09/18/2011 [facility] Patient Information 2013 [place], [place].

## 2013-08-22 ENCOUNTER — Other Ambulatory Visit (INDEPENDENT_AMBULATORY_CARE_PROVIDER_SITE_OTHER): Payer: 59

## 2013-08-22 DIAGNOSIS — I10 Essential (primary) hypertension: Secondary | ICD-10-CM

## 2013-08-22 DIAGNOSIS — E785 Hyperlipidemia, unspecified: Secondary | ICD-10-CM

## 2013-08-22 LAB — BASIC METABOLIC PANEL
BUN: 17 mg/dL (ref 6–23)
CO2: 28 mEq/L (ref 19–32)
Calcium: 9.9 mg/dL (ref 8.4–10.5)
Chloride: 105 mEq/L (ref 96–112)
Creatinine, Ser: 0.9 mg/dL (ref 0.4–1.2)
GFR: 64.84 mL/min (ref >60.00)
Glucose, Bld: 92 mg/dL (ref 70–99)
Potassium: 4 mEq/L (ref 3.5–5.1)
Sodium: 139 mEq/L (ref 135–145)

## 2013-08-22 LAB — LDL CHOLESTEROL, DIRECT: Direct LDL: 146.9 mg/dL

## 2013-08-22 LAB — LIPID PANEL
HDL: 44.8 mg/dL (ref >39.00)
Total CHOL/HDL Ratio: 5
VLDL: 44.8 mg/dL — ABNORMAL HIGH (ref 0.0–40.0)

## 2013-09-11 ENCOUNTER — Other Ambulatory Visit: Payer: Self-pay | Admitting: Internal Medicine

## 2013-11-09 ENCOUNTER — Ambulatory Visit (INDEPENDENT_AMBULATORY_CARE_PROVIDER_SITE_OTHER): Payer: 59 | Admitting: Nurse Practitioner

## 2013-11-09 ENCOUNTER — Other Ambulatory Visit: Payer: Self-pay | Admitting: Nurse Practitioner

## 2013-11-09 ENCOUNTER — Telehealth: Payer: Self-pay | Admitting: *Deleted

## 2013-11-09 ENCOUNTER — Encounter: Payer: Self-pay | Admitting: Nurse Practitioner

## 2013-11-09 VITALS — BP 120/84 | HR 77 | Temp 97.5°F | Ht 64.0 in | Wt 186.2 lb

## 2013-11-09 DIAGNOSIS — N390 Urinary tract infection, site not specified: Secondary | ICD-10-CM

## 2013-11-09 DIAGNOSIS — E785 Hyperlipidemia, unspecified: Secondary | ICD-10-CM

## 2013-11-09 MED ORDER — CIPROFLOXACIN HCL 250 MG PO TABS: 250.0000 mg | ORAL_TABLET | Freq: Two times a day (BID) | ORAL | Status: DC

## 2013-11-09 MED ORDER — SULFAMETHOXAZOLE-TMP DS 800-160 MG PO TABS: 1.0000 | ORAL_TABLET | Freq: Two times a day (BID) | ORAL | Status: DC

## 2013-11-09 MED ORDER — FENOFIBRIC ACID 135 MG PO CPDR: 135.0000 mg | DELAYED_RELEASE_CAPSULE | Freq: Every day | ORAL | Status: DC

## 2013-11-09 NOTE — Progress Notes (Signed)
Pre-visit discussion using our clinic review tool. No additional management support is needed unless otherwise documented below in the visit note.  

## 2013-11-09 NOTE — Progress Notes (Signed)
   Subjective:    Patient ID: Virginia [name], female    DOB: 05-19-1941, 72 y.o.   MRN: [id number]  Urinary Tract Infection  This is a new problem. The current episode started 1 to 4 weeks ago (1 wk). The problem occurs every urination. The problem has been gradually worsening. The quality of the pain is described as burning. The pain is mild. There has been no fever. She is not sexually active. There is no history of pyelonephritis. Associated symptoms include frequency and urgency. Pertinent negatives include no chills, discharge, flank pain, nausea or vomiting. Associated symptoms comments: Dysuria . She has tried nothing for the symptoms.      Review of Systems  Constitutional: Negative for fever, chills, activity change, appetite change and fatigue.  Respiratory: Negative for cough.   Gastrointestinal: Negative for nausea, vomiting, diarrhea and abdominal distention.  Genitourinary: Positive for dysuria, urgency and frequency. Negative for flank pain.  Musculoskeletal: Negative for back pain.  Psychiatric/Behavioral: Negative for confusion.       Objective:   Physical Exam  Vitals reviewed. Constitutional: She is oriented to person, place, and time. She appears well-developed and well-nourished. No distress.  HENT:  Head: Normocephalic and atraumatic.  Eyes: Conjunctivae are normal. Right eye exhibits no discharge. Left eye exhibits no discharge.  Cardiovascular: Normal rate, regular rhythm and normal heart sounds.   No murmur heard. Pulmonary/Chest: Effort normal.  Abdominal: Soft. Bowel sounds are normal. She exhibits no distension and no mass. There is no tenderness. There is no rebound and no guarding.  Musculoskeletal: She exhibits no tenderness (no cva tenderness).  Neurological: She is alert and oriented to person, place, and time.  Skin: Skin is warm and dry.  Psychiatric: She has a normal mood and affect. Her behavior is normal. Thought content normal.         Assessment & Plan:   1. UTI (urinary tract infection) Poc urine- + blood, leuks Urine culture pending - ciprofloxacin (CIPRO) 250 MG tablet; Take 1 tablet (250 mg total) by mouth 2 (two) times daily.  Dispense: 6 tablet; Refill: 0  See pt instructions.

## 2013-11-09 NOTE — Telephone Encounter (Signed)
Patient called office concerning medication cipro. Patient stated that she was reading the warnings after she picked-up her rx and she read warning about if a patient had rheumatoid arthritis, they should not take this med. Patient wanted to know of she could take azithromycin (Zpack) instead. Please advise?

## 2013-11-09 NOTE — Patient Instructions (Signed)
Sip fluids every hour. Start antibiotic today. Call us if no better in 4 days or you develop fever or back pain. Pleasure to meet you!  Urinary Tract Infection Urinary tract infections (UTIs) can develop anywhere along your urinary tract. Your urinary tract is your body's drainage system for removing wastes and extra water. Your urinary tract includes two kidneys, two ureters, a bladder, and a urethra. Your kidneys are a pair of bean-shaped organs. Each kidney is about the size of your fist. They are located below your ribs, one on each side of your spine. CAUSES Infections are caused by microbes, which are microscopic organisms, including fungi, viruses, and bacteria. These organisms are so small that they can only be seen through a microscope. Bacteria are the microbes that most commonly cause UTIs. SYMPTOMS  Symptoms of UTIs may vary by age and gender of the patient and by the location of the infection. Symptoms in young women typically include a frequent and intense urge to urinate and a painful, burning feeling in the bladder or urethra during urination. Older women and men are more likely to be tired, shaky, and weak and have muscle aches and abdominal pain. A fever may mean the infection is in your kidneys. Other symptoms of a kidney infection include pain in your back or sides below the ribs, nausea, and vomiting. DIAGNOSIS To diagnose a UTI, your caregiver will ask you about your symptoms. Your caregiver also will ask to provide a urine sample. The urine sample will be tested for bacteria and white blood cells. White blood cells are made by your body to help fight infection. TREATMENT  Typically, UTIs can be treated with medication. Because most UTIs are caused by a bacterial infection, they usually can be treated with the use of antibiotics. The choice of antibiotic and length of treatment depend on your symptoms and the type of bacteria causing your infection. HOME CARE INSTRUCTIONS  If you  were prescribed antibiotics, take them exactly as your caregiver instructs you. Finish the medication even if you feel better after you have only taken some of the medication.  Drink enough water and fluids to keep your urine clear or pale yellow.  Avoid caffeine, tea, and carbonated beverages. They tend to irritate your bladder.  Empty your bladder often. Avoid holding urine for long periods of time.  Empty your bladder before and after sexual intercourse.  After a bowel movement, women should cleanse from front to back. Use each tissue only once. SEEK MEDICAL CARE IF:   You have back pain.  You develop a fever.  Your symptoms do not begin to resolve within 3 days. SEEK IMMEDIATE MEDICAL CARE IF:   You have severe back pain or lower abdominal pain.  You develop chills.  You have nausea or vomiting.  You have continued burning or discomfort with urination. MAKE SURE YOU:   Understand these instructions.  Will watch your condition.  Will get help right away if you are not doing well or get worse. Document Released: 08/27/2005 Document Revised: 05/18/2012 Document Reviewed: 12/26/2011 [facility] Patient Information 2014 [place], [place].

## 2013-11-09 NOTE — Progress Notes (Signed)
Patient notified

## 2013-11-10 LAB — URINALYSIS, MICROSCOPIC ONLY
Casts: NONE SEEN
Crystals: NONE SEEN

## 2013-11-10 LAB — URINALYSIS, ROUTINE W REFLEX MICROSCOPIC
Ketones, ur: NEGATIVE mg/dL
Nitrite: NEGATIVE
Specific Gravity, Urine: 1.012 (ref 1.005–1.030)
Urobilinogen, UA: 1 mg/dL (ref 0.0–1.0)

## 2013-11-12 LAB — URINE CULTURE: Colony Count: >=100000

## 2013-11-14 NOTE — Telephone Encounter (Signed)
Received msg from Virginia [name]. NP ptneed to make 4 week f/u. Called pt made appt for 12/12/13 @ 10:45am/lmb

## 2013-12-12 ENCOUNTER — Ambulatory Visit (INDEPENDENT_AMBULATORY_CARE_PROVIDER_SITE_OTHER): Payer: 59 | Admitting: Internal Medicine

## 2013-12-12 ENCOUNTER — Encounter: Payer: Self-pay | Admitting: Internal Medicine

## 2013-12-12 VITALS — BP 132/80 | HR 69 | Temp 98.4°F | Wt 184.4 lb

## 2013-12-12 DIAGNOSIS — E785 Hyperlipidemia, unspecified: Secondary | ICD-10-CM

## 2013-12-12 DIAGNOSIS — I10 Essential (primary) hypertension: Secondary | ICD-10-CM

## 2013-12-12 DIAGNOSIS — M549 Dorsalgia, unspecified: Secondary | ICD-10-CM

## 2013-12-12 NOTE — Progress Notes (Signed)
Subjective:    Patient ID: Virginia [name], female    DOB: [birth year], 73 y.o.   MRN: [id number]  HPI  Patient here today for follow up.  Chronic medical issues reviewed.    low back pain -RLE sciatica pain dx 12/2010 - prior tx by [facility] - s/p 3 ESI in 2012 - rx'd meloxicam -taking every 2 weeks, also massage - chiropractor services ongoing.  Reports sig improvement with chiropractor.   hypertension - reports compliance with ongoing medical treatment and no changes in medication dose or frequency. denies adverse side effects related to current therapy. Denies CV symptoms.   dyslipidemia - intolerant of statins in the past.  LDL not at goal, patient working on increasing exercise and heart healthy diet.   hx colon polyps 08/2004 - not due for repeat screening until 2015 per her report.  Needs to establish with GI locally - last colonoscopy done in [place].   allergic rhinitis - reports compliance with ongoing medical treatment and no changes in medication dose or frequency. denies adverse side effects related to current therapy.   GERD with dysphagia hx - s/p EGD with "stretching" 08/2009 and (2013 in [place] tx with NTG) no regurgitation or vomitting, no dysphagia - takes PPI as needed only  Past Medical History  Diagnosis Date  . DIVERTICULITIS, HX OF   . FIBROIDS, UTERUS     s/p fibroidectomy  . 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 ALLERGIC RHINITIS   . OSTEOARTHRITIS   . GERD   . HYPERLIPIDEMIA     intol of statins (myalgia, thumb pain)  . HYPERTENSION     Review of Systems  Constitutional: Negative for fever, chills, activity change and appetite change.  HENT: Negative for congestion, rhinorrhea and sinus pressure.   Respiratory: Negative for chest tightness, shortness of breath and wheezing.   Cardiovascular: Negative for chest pain, palpitations and leg swelling.  Gastrointestinal: Negative for nausea, vomiting, abdominal pain, diarrhea, constipation and  abdominal distention.  Endocrine: Negative for cold intolerance and heat intolerance.  Genitourinary: Negative for dysuria and hematuria.  Skin: Negative for rash.  Neurological: Negative for syncope, weakness, numbness and headaches.       Objective:   Physical Exam  Vitals reviewed. Constitutional: She is oriented to person, place, and time. She appears well-developed and well-nourished. No distress.  HENT:  Head: Normocephalic and atraumatic.  Eyes: Conjunctivae are normal. Pupils are equal, round, and reactive to light. Right eye exhibits no discharge. Left eye exhibits no discharge.  Neck: Normal range of motion. Neck supple. No thyromegaly present.  Cardiovascular: Normal rate, regular rhythm and normal heart sounds.   No murmur heard. Pulmonary/Chest: Effort normal and breath sounds normal. No respiratory distress. She has no wheezes.  Abdominal: Soft. Bowel sounds are normal. She exhibits no distension. There is no guarding.  Musculoskeletal: Normal range of motion. She exhibits no edema and no tenderness.  Lymphadenopathy:    She has no cervical adenopathy.  Neurological: She is alert and oriented to person, place, and time.  Skin: Skin is warm and dry. No rash noted. She is not diaphoretic.  Psychiatric: She has a normal mood and affect. Her behavior is normal. Judgment and thought content normal.     Wt Readings from Last 3 Encounters:  12/12/13 184 lb 6.4 oz (83.643 kg)  11/09/13 186 lb 3.2 oz (84.46 kg)  08/10/13 183 lb 6.4 oz (83.19 kg)   BP Readings from Last 3 Encounters:  12/12/13  132/80  11/09/13 120/84  08/10/13 140/80   Lab Results  Component Value Date   WBC 5.9 11/02/2008   HGB 14.1 11/02/2008   HCT 43.6 11/02/2008   PLT 207 11/02/2008   GLUCOSE 92 08/22/2013   CHOL 220* 08/22/2013   TRIG 224.0* 08/22/2013   HDL 44.80 08/22/2013   LDLDIRECT 146.9 08/22/2013   LDLCALC 159 11/02/2008   ALT 31 11/02/2008   AST 29 11/02/2008   NA 139 08/22/2013   K 4.0  08/22/2013   CL 105 08/22/2013   CREATININE 0.9 08/22/2013   BUN 17 08/22/2013   CO2 28 08/22/2013   TSH 3.286 11/02/2008       Assessment & Plan:   Trigger finger - left middle, 4th right.  Some locking especially in the morning.  Advised importance of double bandaids.  Pt declines need for further treatment at this time.  Time spent with pt today 25 minutes, greater than 50% time spent counseling patient on chronic medical issues, back symptoms and medication review. Also review of prior records  See problem list -

## 2013-12-12 NOTE — Patient Instructions (Addendum)
It was good to see you today.  We have reviewed your prior records including labs and tests today  Medications reviewed and updated, no changes recommended at this time.  Work on lifestyle changes as discussed (low fat, low carb, increased protein diet; improved exercise efforts; weight loss) to control sugar, blood pressure and cholesterol levels and/or reduce risk of developing other medical problems. Look into [email or web address] or other type of food journal to assist you in this process.  Please schedule followup in 6 months, call sooner if problems.  Exercise to Lose Weight Exercise and a healthy diet may help you lose weight. Your doctor may suggest specific exercises. EXERCISE IDEAS AND TIPS  Choose low-cost things you enjoy doing, such as walking, bicycling, or exercising to workout videos.  Take stairs instead of the elevator.  Walk during your lunch break.  Park your car further away from work or school.  Go to a gym or an exercise class.  Start with 5 to 10 minutes of exercise each day. Build up to 30 minutes of exercise 4 to 6 days a week.  Wear shoes with good support and comfortable clothes.  Stretch before and after working out.  Work out until you breathe harder and your heart beats faster.  Drink extra water when you exercise.  Do not do so much that you hurt yourself, feel dizzy, or get very short of breath. Exercises that burn about 150 calories:  Running 1  miles in 15 minutes.  Playing volleyball for 45 to 60 minutes.  Washing and waxing a car for 45 to 60 minutes.  Playing touch football for 45 minutes.  Walking 1  miles in 35 minutes.  Pushing a stroller 1  miles in 30 minutes.  Playing basketball for 30 minutes.  Raking leaves for 30 minutes.  Bicycling 5 miles in 30 minutes.  Walking 2 miles in 30 minutes.  Dancing for 30 minutes.  Shoveling snow for 15 minutes.  Swimming laps for 20 minutes.  Walking up stairs for 15  minutes.  Bicycling 4 miles in 15 minutes.  Gardening for 30 to 45 minutes.  Jumping rope for 15 minutes.  Washing windows or floors for 45 to 60 minutes. Document Released: 12/20/2010 Document Revised: 02/09/2012 Document Reviewed: 12/20/2010 [facility] Patient Information 2014 [place], [place].

## 2013-12-12 NOTE — Progress Notes (Signed)
Pre-visit discussion using our clinic review tool. No additional management support is needed unless otherwise documented below in the visit note.  

## 2013-12-12 NOTE — Assessment & Plan Note (Addendum)
Ortho and PT tx for same spring 2012 - s/p 3 ESI in 2012 with improvement but not resolved Previously associated with RLE sciatica which is currently improved Taking meloxicam prn with fair control - encouraged to take prn Also ongoing chiropractor manipulation Not interested in "surgical options" or repeat ESI but agrees to call if symptoms worse or uncontrolled with medical therapy Also encouraged to consider PT - pt declines need for same at this time

## 2013-12-12 NOTE — Assessment & Plan Note (Addendum)
Intolerant of statins (lipitor, crestor in prior tx trials) and reluctant to take niacin - uses fish oil and fenofibrate LDL not at goal - unable to use other RX due to intolerances.  Education of diet/exercise for risk reduction - reviewed same again today Recheck FLP annually   

## 2013-12-12 NOTE — Assessment & Plan Note (Addendum)
BP Readings from Last 3 Encounters:  12/12/13 132/80  11/09/13 120/84  08/10/13 140/80   Currently on metoprolol.  Reports compliance.  Tolerating without adverse effects.  Denies CV symptoms.  The current medical regimen is effective;  continue present plan and medications.

## 2014-01-03 ENCOUNTER — Telehealth: Payer: Self-pay | Admitting: Internal Medicine

## 2014-01-03 NOTE — Telephone Encounter (Signed)
Relevant patient education mailed to patient.  

## 2014-02-07 ENCOUNTER — Ambulatory Visit: Payer: 59 | Admitting: Internal Medicine

## 2014-02-15 ENCOUNTER — Ambulatory Visit: Payer: 59 | Admitting: Internal Medicine

## 2014-02-16 LAB — HM MAMMOGRAPHY

## 2014-02-17 ENCOUNTER — Encounter: Payer: Self-pay | Admitting: Internal Medicine

## 2014-03-06 ENCOUNTER — Encounter: Payer: Self-pay | Admitting: Internal Medicine

## 2014-03-15 ENCOUNTER — Other Ambulatory Visit: Payer: Self-pay | Admitting: Internal Medicine

## 2014-05-18 ENCOUNTER — Encounter: Payer: Self-pay | Admitting: Internal Medicine

## 2014-06-12 ENCOUNTER — Encounter: Payer: Self-pay | Admitting: Internal Medicine

## 2014-06-12 ENCOUNTER — Ambulatory Visit (INDEPENDENT_AMBULATORY_CARE_PROVIDER_SITE_OTHER): Payer: 59 | Admitting: Internal Medicine

## 2014-06-12 VITALS — BP 130/70 | HR 63 | Temp 97.7°F | Ht 64.0 in | Wt 184.5 lb

## 2014-06-12 DIAGNOSIS — Z13820 Encounter for screening for osteoporosis: Secondary | ICD-10-CM

## 2014-06-12 DIAGNOSIS — E785 Hyperlipidemia, unspecified: Secondary | ICD-10-CM

## 2014-06-12 DIAGNOSIS — R238 Other skin changes: Secondary | ICD-10-CM

## 2014-06-12 DIAGNOSIS — I10 Essential (primary) hypertension: Secondary | ICD-10-CM

## 2014-06-12 DIAGNOSIS — Z0000 Encounter for general adult medical examination without abnormal findings: Secondary | ICD-10-CM

## 2014-06-12 DIAGNOSIS — Z1211 Encounter for screening for malignant neoplasm of colon: Secondary | ICD-10-CM

## 2014-06-12 DIAGNOSIS — R239 Unspecified skin changes: Secondary | ICD-10-CM

## 2014-06-12 MED ORDER — FENOFIBRIC ACID 135 MG PO CPDR: 135.0000 mg | DELAYED_RELEASE_CAPSULE | Freq: Every day | ORAL | Status: DC

## 2014-06-12 MED ORDER — METOPROLOL TARTRATE 50 MG PO TABS: ORAL_TABLET | ORAL | Status: DC

## 2014-06-12 NOTE — Patient Instructions (Addendum)
It was good to see you today.  We have reviewed your prior records including labs and tests today  Health Maintenance reviewed - refer for colonoscopy and schedule bone density test - all other recommended immunizations and age-appropriate screenings are up-to-date.  Test(s) ordered today. Return when you are fasting. Your results will be released to [place] (or called to you) after review, usually within 72hours after test completion. If any changes need to be made, you will be notified at that same time.  Medications reviewed and updated, no changes recommended at this time.  we'll make referral to dermatologist for skin check . Our office will contact you regarding appointment(s) once made.  Please schedule followup in 12 months for annual exam and labs, call sooner if problems.  Health Maintenance, Female A healthy lifestyle and preventative care can promote health and wellness.  Maintain regular health, dental, and eye exams.  Eat a healthy diet. Foods like vegetables, fruits, whole grains, low-fat dairy products, and lean protein foods contain the nutrients you need without too many calories. Decrease your intake of foods high in solid fats, added sugars, and salt. Get information about a proper diet from your caregiver, if necessary.  Regular physical exercise is one of the most important things you can do for your health. Most adults should get at least 150 minutes of moderate-intensity exercise (any activity that increases your heart rate and causes you to sweat) each week. In addition, most adults need muscle-strengthening exercises on 2 or more days a week.   Maintain a healthy weight. The body mass index (BMI) is a screening tool to identify possible weight problems. It provides an estimate of body fat based on height and weight. Your caregiver can help determine your BMI, and can help you achieve or maintain a healthy weight. For adults 20 years and older:  A BMI below 18.5 is  considered underweight.  A BMI of 18.5 to 24.9 is normal.  A BMI of 25 to 29.9 is considered overweight.  A BMI of 30 and above is considered obese.  Maintain normal blood lipids and cholesterol by exercising and minimizing your intake of saturated fat. Eat a balanced diet with plenty of fruits and vegetables. Blood tests for lipids and cholesterol should begin at age 13 and be repeated every 5 years. If your lipid or cholesterol levels are high, you are over 50, or you are a high risk for heart disease, you may need your cholesterol levels checked more frequently.Ongoing high lipid and cholesterol levels should be treated with medicines if diet and exercise are not effective.  If you smoke, find out from your caregiver how to quit. If you do not use tobacco, do not start.  Lung cancer screening is recommended for adults aged 8-80 years who are at high risk for developing lung cancer because of a history of smoking. Yearly low-dose computed tomography (CT) is recommended for people who have at least a 30-pack-year history of smoking and are a current smoker or have quit within the past 15 years. A pack year of smoking is smoking an average of 1 pack of cigarettes a day for 1 year (for example: 1 pack a day for 30 years or 2 packs a day for 15 years). Yearly screening should continue until the smoker has stopped smoking for at least 15 years. Yearly screening should also be stopped for people who develop a health problem that would prevent them from having lung cancer treatment.  If you are pregnant,  do not drink alcohol. If you are breastfeeding, be very cautious about drinking alcohol. If you are not pregnant and choose to drink alcohol, do not exceed 1 drink per day. One drink is considered to be 12 ounces (355 mL) of beer, 5 ounces (148 mL) of wine, or 1.5 ounces (44 mL) of liquor.  Avoid use of street drugs. Do not share needles with anyone. Ask for help if you need support or instructions  about stopping the use of drugs.  High blood pressure causes heart disease and increases the risk of stroke. Blood pressure should be checked at least every 1 to 2 years. Ongoing high blood pressure should be treated with medicines, if weight loss and exercise are not effective.  If you are 73 to 71 years old, ask your caregiver if you should take aspirin to prevent strokes.  Diabetes screening involves taking a blood sample to check your fasting blood sugar level. This should be done once every 3 years, after age 31, if you are within normal weight and without risk factors for diabetes. Testing should be considered at a younger age or be carried out more frequently if you are overweight and have at least 1 risk factor for diabetes.  Breast cancer screening is essential preventative care for women. You should practice "breast self-awareness." This means understanding the normal appearance and feel of your breasts and may include breast self-examination. Any changes detected, no matter how small, should be reported to a caregiver. Women in their 39s and 30s should have a clinical breast exam (CBE) by a caregiver as part of a regular health exam every 1 to 3 years. After age 7, women should have a CBE every year. Starting at age 28, women should consider having a mammogram (breast X-ray) every year. Women who have a family history of breast cancer should talk to their caregiver about genetic screening. Women at a high risk of breast cancer should talk to their caregiver about having an MRI and a mammogram every year.  Breast cancer gene (BRCA)-related cancer risk assessment is recommended for women who have family members with BRCA-related cancers. BRCA-related cancers include breast, ovarian, tubal, and peritoneal cancers. Having family members with these cancers may be associated with an increased risk for harmful changes (mutations) in the breast cancer genes BRCA1 and BRCA2. Results of the assessment  will determine the need for genetic counseling and BRCA1 and BRCA2 testing.  The Pap test is a screening test for cervical cancer. Women should have a Pap test starting at age 14. Between ages 7 and 49, Pap tests should be repeated every 2 years. Beginning at age 87, you should have a Pap test every 3 years as long as the past 3 Pap tests have been normal. If you had a hysterectomy for a problem that was not cancer or a condition that could lead to cancer, then you no longer need Pap tests. If you are between ages 99 and 17, and you have had normal Pap tests going back 10 years, you no longer need Pap tests. If you have had past treatment for cervical cancer or a condition that could lead to cancer, you need Pap tests and screening for cancer for at least 20 years after your treatment. If Pap tests have been discontinued, risk factors (such as a new sexual partner) need to be reassessed to determine if screening should be resumed. Some women have medical problems that increase the chance of getting cervical cancer. In these cases,  your caregiver may recommend more frequent screening and Pap tests.  The human papillomavirus (HPV) test is an additional test that may be used for cervical cancer screening. The HPV test looks for the virus that can cause the cell changes on the cervix. The cells collected during the Pap test can be tested for HPV. The HPV test could be used to screen women aged 70 years and older, and should be used in women of any age who have unclear Pap test results. After the age of 26, women should have HPV testing at the same frequency as a Pap test.  Colorectal cancer can be detected and often prevented. Most routine colorectal cancer screening begins at the age of 52 and continues through age 38. However, your caregiver may recommend screening at an earlier age if you have risk factors for colon cancer. On a yearly basis, your caregiver may provide home test kits to check for hidden blood  in the stool. Use of a small camera at the end of a tube, to directly examine the colon (sigmoidoscopy or colonoscopy), can detect the earliest forms of colorectal cancer. Talk to your caregiver about this at age 86, when routine screening begins. Direct examination of the colon should be repeated every 5 to 10 years through age 50, unless early forms of pre-cancerous polyps or small growths are found.  Hepatitis C blood testing is recommended for all people born from [birth year] through 1963 and any individual with known risks for hepatitis C.  Practice safe sex. Use condoms and avoid high-risk sexual practices to reduce the spread of sexually transmitted infections (STIs). Sexually active women aged 79 and younger should be checked for Chlamydia, which is a common sexually transmitted infection. Older women with new or multiple partners should also be tested for Chlamydia. Testing for other STIs is recommended if you are sexually active and at increased risk.  Osteoporosis is a disease in which the bones lose minerals and strength with aging. This can result in serious bone fractures. The risk of osteoporosis can be identified using a bone density scan. Women ages 65 and over and women at risk for fractures or osteoporosis should discuss screening with their caregivers. Ask your caregiver whether you should be taking a calcium supplement or vitamin D to reduce the rate of osteoporosis.  Menopause can be associated with physical symptoms and risks. Hormone replacement therapy is available to decrease symptoms and risks. You should talk to your caregiver about whether hormone replacement therapy is right for you.  Use sunscreen. Apply sunscreen liberally and repeatedly throughout the day. You should seek shade when your shadow is shorter than you. Protect yourself by wearing long sleeves, pants, a wide-brimmed hat, and sunglasses year round, whenever you are outdoors.  Notify your caregiver of new moles or  changes in moles, especially if there is a change in shape or color. Also notify your caregiver if a mole is larger than the size of a pencil eraser.  Stay current with your immunizations. Document Released: 06/02/2011 Document Revised: 03/14/2013 Document Reviewed: 10/19/2013 [facility] Patient Information 2015 [place], [place]. This information is not intended to replace advice given to you by your health care provider. Make sure you discuss any questions you have with your health care provider.

## 2014-06-12 NOTE — Progress Notes (Signed)
Pre visit review using our clinic review tool, if applicable. No additional management support is needed unless otherwise documented below in the visit note. 

## 2014-06-12 NOTE — Assessment & Plan Note (Signed)
Intolerant of statins (lipitor, crestor in prior tx trials) and reluctant to take niacin - uses fish oil and fenofibrate LDL not at goal - unable to use other RX due to intolerances.  Education of diet/exercise for risk reduction - reviewed same again today Recheck FLP annually

## 2014-06-12 NOTE — Assessment & Plan Note (Signed)
BP Readings from Last 3 Encounters:  06/12/14 130/70  12/12/13 132/80  11/09/13 120/84   Currently on metoprolol.  Reports compliance.  Tolerating without adverse effects.  Denies CV symptoms.  The current medical regimen is effective;  continue present plan and medications.

## 2014-06-12 NOTE — Progress Notes (Signed)
Subjective:    Patient ID: Virginia [name], female    DOB: 1941/07/21, 73 y.o.   MRN: [id number]  HPI   Here for medicare wellness/physical  Diet: heart healthy  Physical activity: sedentary Depression/mood screen: negative Hearing: intact to whispered voice Visual acuity: grossly normal, performs annual eye exam  ADLs: capable Fall risk: none Home safety: good Cognitive evaluation: intact to orientation, naming, recall and repetition EOL planning: adv directives, full code/ I agree  I have personally reviewed and have noted 1. The patient's medical and social history 2. Their use of alcohol, tobacco or illicit drugs 3. Their current medications and supplements 4. The patient's functional ability including ADL's, fall risks, home safety risks and hearing or visual impairment. 5. Diet and physical activities 6. Evidence for depression or mood disorders   Also reviewed chronic medical issues and interval medical events  Past Medical History  Diagnosis Date  . DIVERTICULITIS, HX OF   . FIBROIDS, UTERUS     s/p fibroidectomy  . 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 ALLERGIC RHINITIS   . OSTEOARTHRITIS   . GERD   . HYPERLIPIDEMIA     intol of statins (myalgia, thumb pain)  . HYPERTENSION    Family History  Problem Relation Age of Onset  . Hyperlipidemia Brother   . Arthritis Mother   . Diabetes Maternal Aunt   . Hyperlipidemia Cousin   . Heart disease Cousin   . Diabetes Paternal Aunt   . Coronary artery disease Father    History  Substance Use Topics  . Smoking status: Never Smoker   . Smokeless tobacco: Not on file     Comment: Married, lives with spouse. Retired Curator  . Alcohol Use: No   Review of Systems  Constitutional: Negative for fatigue and unexpected weight change.  Respiratory: Negative for cough, shortness of breath and wheezing.   Cardiovascular: Negative for chest pain, palpitations and leg swelling.    Gastrointestinal: Negative for nausea, abdominal pain and diarrhea.  Neurological: Negative for dizziness, weakness, light-headedness and headaches.  Psychiatric/Behavioral: Negative for dysphoric mood. The patient is not nervous/anxious.   All other systems reviewed and are negative.      Objective:   Physical Exam  BP 130/70  Pulse 63  Temp(Src) 97.7 F (36.5 C) (Oral)  Ht 5\' 4"  (1.626 m)  Wt 184 lb 8 oz (83.689 kg)  BMI 31.65 kg/m2  SpO2 96% Wt Readings from Last 3 Encounters:  06/12/14 184 lb 8 oz (83.689 kg)  12/12/13 184 lb 6.4 oz (83.643 kg)  11/09/13 186 lb 3.2 oz (84.46 kg)   Constitutional: She is overweight, but appears well-developed and well-nourished. No distress.  HENT: Head: Normocephalic and atraumatic. Ears: B TMs ok, no erythema or effusion; Nose: Nose normal. Mouth/Throat: Oropharynx is clear and moist. No oropharyngeal exudate.  Eyes: Conjunctivae and EOM are normal. Pupils are equal, round, and reactive to light. No scleral icterus.  Neck: Normal range of motion. Neck supple. No JVD present. No thyromegaly present.  Cardiovascular: Normal rate, regular rhythm and normal heart sounds.  No murmur heard. No BLE edema. Pulmonary/Chest: Effort normal and breath sounds normal. No respiratory distress. She has no wheezes.  Abdominal: Soft. Bowel sounds are normal. She exhibits no distension. There is no tenderness. no masses GU/breast - defer Musculoskeletal: Normal range of motion, no joint effusions. No gross deformities Neurological: She is alert and oriented to person, place, and time. No cranial nerve deficit. Coordination,  balance, strength, speech and gait are normal.  Skin: Skin is warm and dry. No rash noted. No erythema.  Psychiatric: She has a normal mood and affect. Her behavior is normal. Judgment and thought content normal.     Lab Results  Component Value Date   WBC 5.9 11/02/2008   HGB 14.1 11/02/2008   HCT 43.6 11/02/2008   PLT 207 11/02/2008    GLUCOSE 92 08/22/2013   CHOL 220* 08/22/2013   TRIG 224.0* 08/22/2013   HDL 44.80 08/22/2013   LDLDIRECT 146.9 08/22/2013   LDLCALC 159 11/02/2008   ALT 31 11/02/2008   AST 29 11/02/2008   NA 139 08/22/2013   K 4.0 08/22/2013   CL 105 08/22/2013   CREATININE 0.9 08/22/2013   BUN 17 08/22/2013   CO2 28 08/22/2013   TSH 3.286 11/02/2008    No results found.     Assessment & Plan:   AWV/CPX/v70.0 - Today patient counseled on age appropriate routine health concerns for screening and prevention, each reviewed and up to date or declined. Immunizations reviewed and up to date or declined. Labs/ECG reviewed. Risk factors for depression reviewed and negative. Hearing function and visual acuity are intact. ADLs screened and addressed as needed. Functional ability and level of safety reviewed and appropriate. Education, counseling and referrals performed based on assessed risks today. Patient provided with a copy of personalized plan for preventive services. Labs ordered and reviewed.  Set up for DEXA Refer for colo (due 08/2014) Refer for "whole body" skin check due to increase # of moles (per pt)  Problem List Items Addressed This Visit   HYPERLIPIDEMIA     Intolerant of statins (lipitor, crestor in prior tx trials) and reluctant to take niacin - uses fish oil and fenofibrate LDL not at goal - unable to use other RX due to intolerances.  Education of diet/exercise for risk reduction - reviewed same again today Recheck FLP annually      Relevant Medications      Choline Fenofibrate (FENOFIBRIC ACID) 135 MG CPDR      metoprolol (LOPRESSOR) tablet   HYPERTENSION      BP Readings from Last 3 Encounters:  06/12/14 130/70  12/12/13 132/80  11/09/13 120/84   Currently on metoprolol.  Reports compliance.  Tolerating without adverse effects.  Denies CV symptoms.  The current medical regimen is effective;  continue present plan and medications.    Relevant Medications      Choline Fenofibrate  (FENOFIBRIC ACID) 135 MG CPDR      metoprolol (LOPRESSOR) tablet    Other Visit Diagnoses   Routine general medical examination at a health care facility    -  Primary    Relevant Orders       Basic metabolic panel       CBC with Differential       Hepatic function panel       Lipid panel       TSH       Urinalysis, Routine w reflex microscopic    Dyslipidemia        Relevant Medications       Choline Fenofibrate (FENOFIBRIC ACID) 135 MG CPDR    Essential hypertension        Relevant Medications       Choline Fenofibrate (FENOFIBRIC ACID) 135 MG CPDR       metoprolol (LOPRESSOR) tablet    Screening for osteoporosis        Relevant Orders  DG Bone Density    Special screening for malignant neoplasms, colon        Relevant Orders       Ambulatory referral to Gastroenterology    Skin change        Relevant Orders       Ambulatory referral to Dermatology

## 2014-06-14 ENCOUNTER — Ambulatory Visit (INDEPENDENT_AMBULATORY_CARE_PROVIDER_SITE_OTHER)
Admission: RE | Admit: 2014-06-14 | Discharge: 2014-06-14 | Disposition: A | Discharge status: Home / Self Care | Payer: 59 | Source: Ambulatory Visit | Attending: Internal Medicine | Admitting: Internal Medicine

## 2014-06-14 ENCOUNTER — Other Ambulatory Visit (INDEPENDENT_AMBULATORY_CARE_PROVIDER_SITE_OTHER): Payer: 59

## 2014-06-14 DIAGNOSIS — I10 Essential (primary) hypertension: Secondary | ICD-10-CM

## 2014-06-14 DIAGNOSIS — Z13820 Encounter for screening for osteoporosis: Secondary | ICD-10-CM

## 2014-06-14 DIAGNOSIS — E785 Hyperlipidemia, unspecified: Secondary | ICD-10-CM

## 2014-06-14 DIAGNOSIS — Z0000 Encounter for general adult medical examination without abnormal findings: Secondary | ICD-10-CM

## 2014-06-14 LAB — BASIC METABOLIC PANEL
BUN: 23 mg/dL (ref 6–23)
CALCIUM: 10.1 mg/dL (ref 8.4–10.5)
CO2: 29 mEq/L (ref 19–32)
Chloride: 105 mEq/L (ref 96–112)
Creatinine, Ser: 0.9 mg/dL (ref 0.4–1.2)
GFR: 63.09 mL/min (ref >60.00)
Glucose, Bld: 114 mg/dL — ABNORMAL HIGH (ref 70–99)
Potassium: 4.6 mEq/L (ref 3.5–5.1)
SODIUM: 140 meq/L (ref 135–145)

## 2014-06-14 LAB — URINALYSIS, ROUTINE W REFLEX MICROSCOPIC
Bilirubin Urine: NEGATIVE
Hgb urine dipstick: NEGATIVE
Ketones, ur: NEGATIVE
Nitrite: NEGATIVE
PH: 5.5 (ref 5.0–8.0)
SPECIFIC GRAVITY, URINE: 1.01 (ref 1.000–1.030)
TOTAL PROTEIN, URINE-UPE24: NEGATIVE
URINE GLUCOSE: NEGATIVE
Urobilinogen, UA: 0.2 (ref 0.0–1.0)

## 2014-06-14 LAB — CBC WITH DIFFERENTIAL/PLATELET
BASOS ABS: 0 10*3/uL (ref 0.0–0.1)
Basophils Relative: 0.5 % (ref 0.0–3.0)
EOS PCT: 5 % (ref 0.0–5.0)
Eosinophils Absolute: 0.3 10*3/uL (ref 0.0–0.7)
HEMATOCRIT: 42.6 % (ref 36.0–46.0)
Hemoglobin: 14.2 g/dL (ref 12.0–15.0)
LYMPHS ABS: 2.2 10*3/uL (ref 0.7–4.0)
LYMPHS PCT: 32.7 % (ref 12.0–46.0)
MCHC: 33.4 g/dL (ref 30.0–36.0)
MCV: 88.1 fl (ref 78.0–100.0)
MONOS PCT: 6 % (ref 3.0–12.0)
Monocytes Absolute: 0.4 10*3/uL (ref 0.1–1.0)
Neutro Abs: 3.7 10*3/uL (ref 1.4–7.7)
Neutrophils Relative %: 55.8 % (ref 43.0–77.0)
PLATELETS: 208 10*3/uL (ref 150.0–400.0)
RBC: 4.84 Mil/uL (ref 3.87–5.11)
RDW: 13.7 % (ref 11.5–15.5)
WBC: 6.7 10*3/uL (ref 4.0–10.5)

## 2014-06-14 LAB — LIPID PANEL
CHOL/HDL RATIO: 5
CHOLESTEROL: 246 mg/dL — AB (ref 0–200)
HDL: 46.4 mg/dL (ref >39.00)
LDL Cholesterol: 161 mg/dL — ABNORMAL HIGH (ref 0–99)
NonHDL: 199.6
Triglycerides: 195 mg/dL — ABNORMAL HIGH (ref 0.0–149.0)
VLDL: 39 mg/dL (ref 0.0–40.0)

## 2014-06-14 LAB — HEPATIC FUNCTION PANEL
ALT: 24 U/L (ref 0–35)
AST: 22 U/L (ref 0–37)
Albumin: 3.8 g/dL (ref 3.5–5.2)
Alkaline Phosphatase: 40 U/L (ref 39–117)
BILIRUBIN DIRECT: 0.1 mg/dL (ref 0.0–0.3)
TOTAL PROTEIN: 6.9 g/dL (ref 6.0–8.3)
Total Bilirubin: 0.9 mg/dL (ref 0.2–1.2)

## 2014-06-14 LAB — TSH: TSH: 2.66 u[IU]/mL (ref 0.35–4.50)

## 2014-06-19 ENCOUNTER — Ambulatory Visit: Payer: 59 | Admitting: Internal Medicine

## 2014-06-21 ENCOUNTER — Encounter: Payer: Self-pay | Admitting: Internal Medicine

## 2014-06-21 ENCOUNTER — Telehealth: Payer: Self-pay

## 2014-06-21 DIAGNOSIS — M8580 Other specified disorders of bone density and structure, unspecified site: Secondary | ICD-10-CM

## 2014-06-21 HISTORY — DX: Other specified disorders of bone density and structure, unspecified site: M85.80

## 2014-06-21 NOTE — Telephone Encounter (Signed)
LVM for pt to call back in regards to their dexa results.

## 2014-07-18 ENCOUNTER — Telehealth: Payer: Self-pay | Admitting: Internal Medicine

## 2014-07-18 NOTE — Telephone Encounter (Signed)
Patient Information:  Caller Name: Juliann Pulse  Phone: [phone]  Patient: Virginia [name], Virginia [name]  Gender: Female  DOB: [birth year]  Age: 73 Years  PCP: Gwendolyn Grant (Adults only)  Office Follow Up:  Does the office need to follow up with this patient?: No  Instructions For The Office: N/A  RN Note:  Tick Bite, onset 8-17.  Pt was able to remove 2 ticks successfully on 8-17, Pt denies any sxs at triage.  All emergent sxs ruled out per Tick bite protocol, home care given.  Symptoms  Reason For Call & Symptoms: Tick Bite, onset 8-17  Reviewed Health History In EMR: Yes  Reviewed Medications In EMR: Yes  Reviewed Allergies In EMR: Yes  Reviewed Surgeries / Procedures: Yes  Date of Onset of Symptoms: 07/17/2014  Treatments Tried: alcohol  Treatments Tried Worked: No  Guideline(s) Used:  Tick Bite  Disposition Per Guideline:   Home Care  Reason For Disposition Reached:   Tick bite with no complications  Advice Given:  Tiny Deer Tick Removal:  Deer ticks are very small and need to be scraped off with a credit card edge or the edge of a knife blade.  Antibiotic Ointment:  Wash the wound and your hands with soap and water after removal to prevent catching any tick disease. Apply an over-the-counter antibiotic ointment (e.g., bacitracin) to the bite once.  Expected Course:  Tick bites normally do not itch or hurt. That is why they often go unnoticed.  Call Back If:  You can't remove the tick or the tick's head  Fever or rash occur in the next 2 weeks  Bite begins to look infected  You become worse.  Patient Will Follow Care Advice:  YES

## 2014-07-26 LAB — HM MAMMOGRAPHY

## 2014-08-01 ENCOUNTER — Encounter: Payer: Self-pay | Admitting: Internal Medicine

## 2014-08-01 ENCOUNTER — Ambulatory Visit (INDEPENDENT_AMBULATORY_CARE_PROVIDER_SITE_OTHER): Payer: 59 | Admitting: Internal Medicine

## 2014-08-01 VITALS — BP 142/72 | HR 83 | Temp 98.4°F | Resp 13 | Wt 187.2 lb

## 2014-08-01 DIAGNOSIS — S30860A Insect bite (nonvenomous) of lower back and pelvis, initial encounter: Secondary | ICD-10-CM

## 2014-08-01 DIAGNOSIS — W57XXXA Bitten or stung by nonvenomous insect and other nonvenomous arthropods, initial encounter: Principal | ICD-10-CM

## 2014-08-01 NOTE — Progress Notes (Signed)
Pre visit review using our clinic review tool, if applicable. No additional management support is needed unless otherwise documented below in the visit note. 

## 2014-08-01 NOTE — Progress Notes (Signed)
   Subjective:    Patient ID: Virginia [name], female    DOB: 06-18-1941, 73 y.o.   MRN: [id number]  HPI  She was bitten by a tick 07/17/2014. There's been associated erythematous change & intense pruritus. She's been applying Polysporin antibiotic ointment twice a day to this area.  She denies any neurologic or constitutional symptoms.    Review of Systems  No associated itchy, watery eyes.  Swelling of the lips or tongue denied.  Shortness of breath, wheezing, or cough absent.  No urticaria noted.  Fever ,chills , or sweats denied. Purulence absent.  Diarrhea not present.  No headache or stiff neck.     Objective:   Physical Exam   Pertinent positive findings include: Accentuated curvature of the upper thoracic spine. Irregular faint pink rash with subcutaneous lipomatous changes which transilluminates measuring 16 x 24 mm @ the right posterior thorax. There no signs of meningismus; neck is supple and straight leg raising is negative bilaterally.   General appearance :adequately nourished; in no distress.  Eyes: No conjunctival inflammation or scleral icterus is present.  Oral exam: Dental hygiene is good. Lips and gums are healthy appearing.There is no oropharyngeal erythema or exudate noted.   Heart:  Normal rate and regular rhythm. S1 and S2 normal without gallop, murmur, click, rub or other extra sounds     Lungs:Chest clear to auscultation; no wheezes, rhonchi,rales ,or rubs present.No increased work of breathing.   Abdomen: bowel sounds normal, soft and non-tender without masses, organomegaly or hernias noted.  No guarding or rebound.   Skin:Warm & dry.  No jaundice or tenting  Lymphatic: No lymphadenopathy is noted about the head, neck, axilla            Assessment & Plan:  #1 tick bite  #2 localized rash; I would be concerned that this represents a contact dermatitis from antibiotic ointment rather than cellulitis.  See after visit  summary

## 2014-08-01 NOTE — Patient Instructions (Signed)
Dip gauze in  sterile saline and applied to the area twice a day. Cover the wound with Telfa , non stick dressing  without any antibiotic ointment. The saline can be purchased at the drugstore or you can make your own .Boil cup of salt in a gallon of water. Store mixture  in a clean container.Report Warning  signs as discussed (red streaks, pus, fever, increasing pain).

## 2014-08-02 ENCOUNTER — Encounter: Payer: Self-pay | Admitting: Internal Medicine

## 2014-09-05 ENCOUNTER — Encounter: Payer: Self-pay | Admitting: Internal Medicine

## 2014-11-06 ENCOUNTER — Encounter: Payer: 59 | Admitting: Internal Medicine

## 2014-12-12 ENCOUNTER — Ambulatory Visit (AMBULATORY_SURGERY_CENTER): Payer: Self-pay

## 2014-12-12 VITALS — Ht 64.0 in | Wt 185.8 lb

## 2014-12-12 DIAGNOSIS — Z860100 Personal history of colon polyps, unspecified: Secondary | ICD-10-CM

## 2014-12-12 MED ORDER — MOVIPREP 100 G PO SOLR: ORAL | Status: DC

## 2014-12-12 NOTE — Progress Notes (Signed)
Per pt, no allergies to soy or egg products.Pt not taking any weight loss meds or using  O2 at home. 

## 2014-12-26 ENCOUNTER — Encounter: Payer: 59 | Admitting: Internal Medicine

## 2015-01-01 ENCOUNTER — Encounter: Payer: Self-pay | Admitting: Internal Medicine

## 2015-01-24 ENCOUNTER — Other Ambulatory Visit: Payer: Self-pay | Admitting: Internal Medicine

## 2015-02-15 ENCOUNTER — Encounter: Payer: Self-pay | Admitting: Internal Medicine

## 2015-02-15 ENCOUNTER — Ambulatory Visit (AMBULATORY_SURGERY_CENTER): Payer: Medicare Other | Admitting: Internal Medicine

## 2015-02-15 VITALS — BP 124/67 | HR 53 | Temp 96.2°F | Resp 27 | Ht 64.0 in | Wt 185.0 lb

## 2015-02-15 DIAGNOSIS — D123 Benign neoplasm of transverse colon: Secondary | ICD-10-CM

## 2015-02-15 DIAGNOSIS — Z1211 Encounter for screening for malignant neoplasm of colon: Secondary | ICD-10-CM

## 2015-02-15 DIAGNOSIS — Z86010 Personal history of colonic polyps: Secondary | ICD-10-CM

## 2015-02-15 MED ORDER — SODIUM CHLORIDE 0.9 % IV SOLN: 500.0000 mL | INTRAVENOUS | Status: DC

## 2015-02-15 NOTE — Patient Instructions (Signed)
YOU HAD AN ENDOSCOPIC PROCEDURE TODAY AT THE [facility]:   Refer to the procedure report that was given to you for any specific questions about what was found during the examination.  If the procedure report does not answer your questions, please call your gastroenterologist to clarify.  If you requested that your care partner not be given the details of your procedure findings, then the procedure report has been included in a sealed envelope for you to review at your convenience later.  YOU SHOULD EXPECT: Some feelings of bloating in the abdomen. Passage of more gas than usual.  Walking can help get rid of the air that was put into your GI tract during the procedure and reduce the bloating. If you had a lower endoscopy (such as a colonoscopy or flexible sigmoidoscopy) you may notice spotting of blood in your stool or on the toilet paper. If you underwent a bowel prep for your procedure, you may not have a normal bowel movement for a few days.  Please Note:  You might notice some irritation and congestion in your nose or some drainage.  This is from the oxygen used during your procedure.  There is no need for concern and it should clear up in a day or so.  SYMPTOMS TO REPORT IMMEDIATELY:   Following lower endoscopy (colonoscopy or flexible sigmoidoscopy):  Excessive amounts of blood in the stool  Significant tenderness or worsening of abdominal pains  Swelling of the abdomen that is new, acute  Fever of 100F or higher   For urgent or emergent issues, a gastroenterologist can be reached at any hour by calling [phone].   DIET: Your first meal following the procedure should be a small meal and then it is ok to progress to your normal diet. Heavy or fried foods are harder to digest and may make you feel nauseous or bloated.  Likewise, meals heavy in dairy and vegetables can increase bloating.  Drink plenty of fluids but you should avoid alcoholic beverages for 24  hours.  ACTIVITY:  You should plan to take it easy for the rest of today and you should NOT DRIVE or use heavy machinery until tomorrow (because of the sedation medicines used during the test).    FOLLOW UP: Our staff will call the number listed on your records the next business day following your procedure to check on you and address any questions or concerns that you may have regarding the information given to you following your procedure. If we do not reach you, we will leave a message.  However, if you are feeling well and you are not experiencing any problems, there is no need to return our call.  We will assume that you have returned to your regular daily activities without incident.  If any biopsies were taken you will be contacted by phone or by letter within the next 1-3 weeks.  Please call us at [phone] if you have not heard about the biopsies in 3 weeks.    SIGNATURES/CONFIDENTIALITY: You and/or your care partner have signed paperwork which will be entered into your electronic medical record.  These signatures attest to the fact that that the information above on your After Visit Summary has been reviewed and is understood.  Full responsibility of the confidentiality of this discharge information lies with you and/or your care-partner. 

## 2015-02-15 NOTE — Progress Notes (Signed)
Patient awakening,vss,report to rn 

## 2015-02-15 NOTE — Op Note (Signed)
[place]  [address] [place], [id number]   COLONOSCOPY PROCEDURE REPORT  PATIENT: Virginia [name], Virginia [name]  MR#: [id number] BIRTHDATE: 1941/05/14 , 7  yrs. old GENDER: female ENDOSCOPIST: Jerene Bears, MD REFERRED CM:[id number] Asa Lente, M.D. PROCEDURE DATE:  02/15/2015 PROCEDURE:   Colonoscopy with snare polypectomy First Screening Colonoscopy - Avg.  risk and is 50 yrs.  old or older - No.  Prior Negative Screening - Now for repeat screening. 10 or more years since last screening  History of Adenoma - Now for follow-up colonoscopy & has been > or = to 3 yrs.  N/A ASA CLASS:   Class II INDICATIONS:Screening for colonic neoplasia and Colorectal Neoplasm Risk Assessment for this procedure is average risk. MEDICATIONS: Monitored anesthesia care and Propofol 200 mg IV  DESCRIPTION OF PROCEDURE:   After the risks benefits and alternatives of the procedure were thoroughly explained, informed consent was obtained.  The digital rectal exam revealed no rectal mass.   The LB [id number]  endoscope was introduced through the anus and advanced to the cecum, which was identified by both the appendix and ileocecal valve. No adverse events experienced. The quality of the prep was (MoviPrep was used) good.  The instrument was then slowly withdrawn as the colon was fully examined.   COLON FINDINGS: Two sessile polyps ranging between 3-76mm in size were found in the transverse colon.  Polypectomies were performed with a cold snare.  The resection was complete, the polyp tissue was completely retrieved and sent to histology.   There was moderate diverticulosis noted in the descending colon and sigmoid colon with associated muscular hypertrophy and tortuosity. Retroflexed views revealed external hemorrhoids. The time to cecum = 8.6 Withdrawal time = 9.2   The scope was withdrawn and the procedure completed. COMPLICATIONS: There were no immediate complications.  ENDOSCOPIC  IMPRESSION: 1.   2 transverse colon polyps removed with cold snare 2.   There was moderate diverticulosis noted in the descending colon and sigmoid colon  RECOMMENDATIONS: 1.  Await pathology results 2.  High fiber diet 3.  Timing of repeat colonoscopy will be determined by pathology findings. 4.  You will receive a letter within 1-2 weeks with the results of your biopsy as well as final recommendations.  Please call my office if you have not received a letter after 3 weeks.  eSigned:  Jerene Bears, MD 02/15/2015 11:50 AM   cc: Rowe Clack, MD and The Patient

## 2015-02-15 NOTE — Progress Notes (Signed)
Called to room to assist during endoscopic procedure.  Patient ID and intended procedure confirmed with present staff. Received instructions for my participation in the procedure from the performing physician.  

## 2015-02-16 ENCOUNTER — Telehealth: Payer: Self-pay | Admitting: *Deleted

## 2015-02-16 NOTE — Telephone Encounter (Signed)
  Follow up Call-  Call back number 02/15/2015  Post procedure Call Back phone  # [phone]  Permission to leave phone message Yes     Patient questions:  Do you have a fever, pain , or abdominal swelling? No. Pain Score  0 *  Have you tolerated food without any problems? Yes.    Have you been able to return to your normal activities? Yes.    Do you have any questions about your discharge instructions: Diet   No. Medications  No. Follow up visit  No.  Do you have questions or concerns about your Care? No.  Actions: * If pain score is 4 or above: No action needed, pain <4.

## 2015-02-20 ENCOUNTER — Encounter: Payer: Self-pay | Admitting: Internal Medicine

## 2015-03-17 ENCOUNTER — Other Ambulatory Visit: Payer: Self-pay | Admitting: Internal Medicine

## 2015-04-10 ENCOUNTER — Telehealth: Payer: Self-pay | Admitting: Internal Medicine

## 2015-04-27 NOTE — Telephone Encounter (Signed)
error 

## 2015-05-03 ENCOUNTER — Ambulatory Visit (INDEPENDENT_AMBULATORY_CARE_PROVIDER_SITE_OTHER): Payer: Medicare Other | Admitting: Family

## 2015-05-03 ENCOUNTER — Encounter: Payer: Self-pay | Admitting: Family

## 2015-05-03 VITALS — BP 160/80 | HR 65 | Temp 98.1°F | Ht 64.0 in | Wt 185.5 lb

## 2015-05-03 DIAGNOSIS — Z0000 Encounter for general adult medical examination without abnormal findings: Secondary | ICD-10-CM | POA: Insufficient documentation

## 2015-05-03 NOTE — Progress Notes (Signed)
Patient ID: Virginia [name], female   DOB: 08-Apr-1941, 74 y.o.   MRN: [id number]   Subjective:    Patient ID: Virginia [name], female    DOB: [birth year], 74 y.o.   MRN: [id number]  Chief Complaint  Patient presents with  . Medicare Wellness    HPI:  Virginia [name] is a 74 y.o. female who presents today for an annual wellness visit.    1) Preventative Exams / Immunizations:  Dental -- Up to date  Vision -- Up to date   Health Maintenance  Topic Date Due  . PNA vac Low Risk Adult (1 of 2 - PCV13) 01/05/2008  . INFLUENZA VACCINE  07/02/2015  . MAMMOGRAM  07/26/2016  . TETANUS/TDAP  10/01/2016  . COLONOSCOPY  02/14/2018  . DEXA SCAN  Completed  . ZOSTAVAX  Completed    Immunization History  Administered Date(s) Administered  . Influenza Split 10/01/2012  . Influenza Whole 10/01/2009  . Influenza, High Dose Seasonal PF 08/31/2013  . Td 10/01/2006  . Zoster 02/09/2008  Declines Prevnar  Review of Systems  Constitutional: Denies fever, chills, fatigue, or significant weight gain/loss. HENT: Head: Denies headache or neck pain Ears: Denies changes in hearing, ringing in ears, earache, drainage Nose: Denies discharge, stuffiness, itching, nosebleed, sinus pain Throat: Denies sore throat, hoarseness, dry mouth, sores, thrush Eyes: Denies loss/changes in vision, pain, redness, blurry/double vision, flashing lights Cardiovascular: Denies chest pain/discomfort, tightness, palpitations, shortness of breath with activity, difficulty lying down, swelling, sudden awakening with shortness of breath Respiratory: Denies shortness of breath, cough, sputum production, wheezing Gastrointestinal: Denies dysphasia, heartburn, change in appetite, nausea, change in bowel habits, rectal bleeding, constipation, diarrhea, yellow skin or eyes Genitourinary: Denies frequency, urgency, burning/pain, blood in urine, incontinence, change in urinary strength. Musculoskeletal: Denies  muscle/joint pain, stiffness, back pain, redness or swelling of joints, trauma Skin: Denies rashes, lumps, itching, dryness, color changes, or hair/nail changes Neurological: Denies dizziness, fainting, seizures, weakness, numbness, tingling, tremor Psychiatric - Denies nervousness, stress, depression or memory loss Endocrine: Denies heat or cold intolerance, sweating, frequent urination, excessive thirst, changes in appetite Hematologic: Denies ease of bruising or bleeding     Objective:     BP 160/80 mmHg  Pulse 65  Temp(Src) 98.1 F (36.7 C) (Oral)  Ht 5\' 4"  (1.626 m)  Wt 185 lb 8 oz (84.142 kg)  BMI 31.83 kg/m2  SpO2 98% Nursing note and vital signs reviewed.  Physical Exam  Constitutional: She is oriented to person, place, and time. She appears well-developed and well-nourished.  HENT:  Head: Normocephalic.  Right Ear: Hearing, tympanic membrane, external ear and ear canal normal.  Left Ear: Hearing, tympanic membrane, external ear and ear canal normal.  Nose: Nose normal.  Mouth/Throat: Uvula is midline, oropharynx is clear and moist and mucous membranes are normal.  Eyes: Conjunctivae and EOM are normal. Pupils are equal, round, and reactive to light.  Neck: Neck supple. No JVD present. No tracheal deviation present. No thyromegaly present.  Cardiovascular: Normal rate, regular rhythm, normal heart sounds and intact distal pulses.   Pulmonary/Chest: Effort normal and breath sounds normal.  Abdominal: Soft. Bowel sounds are normal. She exhibits no distension and no mass. There is no tenderness. There is no rebound and no guarding.  Musculoskeletal: Normal range of motion. She exhibits no edema or tenderness.  Lymphadenopathy:    She has no cervical adenopathy.  Neurological: She is alert and oriented to person, place, and time. She has normal reflexes. No cranial nerve deficit. She exhibits  normal muscle tone. Coordination normal.  Skin: Skin is warm and dry.    Psychiatric: She has a normal mood and affect. Her behavior is normal. Judgment and thought content normal.       Assessment & Plan:   Problem List Items Addressed This Visit      Other   Routine general medical examination at a health care facility    1) Anticipatory Guidance: Discussed importance of wearing a seatbelt while driving and not texting while driving; changing batteries in smoke detector at least once annually; wearing suntan lotion when outside; eating a balanced and moderate diet; getting physical activity at least 30 minutes per day. Decreasing risk for falls.   2) Immunizations / Screenings / Labs:  Declines Prevnar vaccination today. Other immunizations are up-to-date per recommendations. All screenings are up-to-date per recommendations. Obtain CBC, CMET, Lipid profile and TSH.   Overall well exam. Patient has risk factors for cardiovascular disease including hypertension and hyperlipidemia. Hypertension appears to be fairly well controlled with current regimen with the average blood pressure just slightly higher than goal of 140/90. Continue to monitor blood pressure at home and follow-up in one month to determine need for additional blood pressure medications. Hypercholesterolemia currently controlled with lifestyle changes as patient is intolerant to statins. Continue working to improve nutrient density of diet and decreasing saturated fats, increasing physical activity to 30 minutes most days of the week. Patient notes some increased anxiety secondary to caring for her husband. Discussed coping mechanisms and provided information regarding stress relief. Follow up prevention exam in 1 year. Follow-up office visit pending lab work.      Medicare annual wellness visit, subsequent    Reviewed and updated patient's medical, surgical, family and social history. Medications and allergies were also reviewed. Basic screenings for depression, activities of daily living, hearing,  cognition and safety were performed. Provider list was updated and health plan was provided to the patient.        Other Visit Diagnoses    Routine history and physical examination of adult    -  Primary    Relevant Orders    Comprehensive metabolic panel    CBC    Lipid panel    TSH

## 2015-05-03 NOTE — Progress Notes (Deleted)
   Subjective:    Patient ID: Virginia [name], female    DOB: Sep 18, 1941, 74 y.o.   MRN: [id number]  HPI    Review of Systems     Objective:   Physical Exam        Assessment & Plan:

## 2015-05-03 NOTE — Progress Notes (Signed)
Pre visit review using our clinic review tool, if applicable. No additional management support is needed unless otherwise documented below in the visit note.    Subjective:   Virginia [name] is a 74 y.o. female who presents for Medicare Annual (Subsequent) preventive examination.  Review of Systems:  HRA assessment completed during visit;  Patient is here for Annual Wellness Assessment  The patient describes their health better, the same or worse than last year? 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  Has motivation to lose weight;  To meet goals; Will attempt to complete [email or web address]   Reviewed: BMI: 31.8 Diet; out to eat some; but cooks  Don't eat fried foods; [place] fries on occasion; Breakfast with an egg; toast and orange juice Lunch  sandwich; tomatoes and avocados' Discussed options;  My fitness [email or web address]   Exercise: has Y membership/swimming  Light weights;    Family Hx Social history Use of tobacco; alcohol or illicit drugs Labs: BS 114 2015; chol 246; Tri 195; HDL 46; LDL 161   Controllable CV risk reviewed for goal setting: Includes; family hx; HTN; decreased renal function; obesity; sedentary lifestyle Educated regarding Metabolic syndrome / Factors that increase risk for Heart Disease:  Educated:metabolic syndrome Excess body fat around the waist Waist circumference women >35 Triglycerides > 150 HDL < 50 BP > 130/85 Glucose > 100 Goals are determined by the physician and based on other relevant data and risk  Plan Lifestyle changes/ Request infor regarding advanced directives   Caregiver to other? Concerned regarding spouse;   Goals: to lose weight Motivation 1-5;   Discussed Goal to improve health based on risk   Screenings; Bone density: July 2015; osteopenia -2.4 LS Colonoscopy; March  2016;  Mammogram if female/ Sept 2015; to repeat this year Pap: if female EKG: 2009  Vision: annual; apt in sept of 2016 Hearing: Ok at 4000 hz Dental: regularly  Gave information on safety to take home;   Current Care Team reviewed and updated    Cardiac Risk Factors include: advanced age (>94men, >69 women);dyslipidemia;family history of premature cardiovascular disease;hypertension;obesity (BMI >30kg/m2)     Objective:     Vitals: BP 160/80 mmHg  Pulse 65  Temp(Src) 98.1 F (36.7 C) (Oral)  Ht 5\' 4"  (1.626 m)  Wt 185 lb 8 oz (84.142 kg)  BMI 31.83 kg/m2  SpO2 98%  Tobacco History  Smoking status  . Never Smoker   Smokeless tobacco  . Never Used    Comment: Married, lives with spouse. Retired Curator     Counseling given: Not Answered   Past Medical History  Diagnosis Date  . DIVERTICULITIS, HX OF   . FIBROIDS, UTERUS     s/p fibroidectomy  . 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 ALLERGIC RHINITIS   . OSTEOARTHRITIS   . GERD   . HYPERLIPIDEMIA     intol of statins (myalgia, thumb pain)  . HYPERTENSION   . Osteopenia 06/21/2014    DEXA@LB  06/14/14: -1.6   Past Surgical History  Procedure Laterality Date  . Child birth  64 & 34  . Low back surgery  01/1994    2 bulging disc  . Tonsillectomy  1968  . Fibroidectomy     Family History  Problem Relation Age of Onset  . Hyperlipidemia Brother   . Arthritis  Mother   . Diabetes Maternal Aunt   . Hyperlipidemia Cousin   . Heart disease Cousin   . Diabetes Paternal Aunt   . Coronary artery disease Father   . Colon cancer Neg Hx    History  Sexual Activity  . Sexual Activity: Not on file    Outpatient Encounter Prescriptions as of 05/03/2015  Medication Sig  . aspirin 81 MG tablet Take 81 mg by mouth daily.    . Choline Fenofibrate (FENOFIBRIC ACID) 135 MG CPDR Take 1 capsule by mouth daily.  Marland Kitchen loratadine (CLARITIN) 10 MG tablet Take 10 mg by mouth daily.  . metoprolol  (LOPRESSOR) 50 MG tablet TAKE 1 TABLET BY MOUTH TWICE A DAY  . Omega-3 Fatty Acids (FISH OIL) 1000 MG CAPS Take 1,000-2,000 mg by mouth 2 (two) times daily. 2000 mg in the morning and 1000 mg at night   No facility-administered encounter medications on file as of 05/03/2015.    Activities of Daily Living In your present state of health, do you have any difficulty performing the following activities: 05/03/2015  Hearing? N  Vision? N  Difficulty concentrating or making decisions? N  Walking or climbing stairs? N  Dressing or bathing? N  Doing errands, shopping? N  Preparing Food and eating ? N  Using the Toilet? N  In the past six months, have you accidently leaked urine? N  Do you have problems with loss of bowel control? N  Managing your Medications? N  Managing your Finances? N  Housekeeping or managing your Housekeeping? N    Patient Care Team: Rowe Clack, MD as PCP - General Wylene Simmer, MD as Consulting Physician (Orthopedic Surgery)    Assessment:    Objective:  Advanced Directives/ given information; referred to pastoral dept at [facility] for assistance if needed  Discussed risk for CVD and or  DM as appropriate  Reviewed  risk and lifestyle choices;   Functional status; Safety Issues identified Depression; sad over changes in spouse  Personalized Education given regarding: weight; Coaching to overcome barriers of getting started  Pt determined a personalized goal yes  Assessment included:  Dexa osteopenic/ JUly 2015 Taking meds without issues; no barriers identified Stress: Recommendations for managing stress if assessed as a factor;  No Risk for hepatitis noted  .  Safety issues reviewed(driving issues; vision; home environment; support network and environmental safety; gun safety; smoke detectors) SAFETY Generalized Safety in the home reviewed/     Cognition assessed by AD8; Score 0 (A score of 2 or greater would indicate the MMSE be completed)     Need for Immunizations or other screenings identified;  (CDC recommmend Prevnar at 65 followed by pnuemovax 23 in one year or 5 years after the last dose.     Exercise Activities and Dietary recommendations Current Exercise Habits:: Structured exercise class, Time (Minutes): 45 (will attempt to go to the pool x 1), Frequency (Times/Week): 2, Weekly Exercise (Minutes/Week): 90, Intensity: Moderate  Goals    . Record weight daily     Agrees to document food intake via [email or web address]  Controllable CV risk reviewed for goal setting: Includes; family hx; HTN; decreased renal function; obesity; sedentary lifestyle Educated regarding Metabolic syndrome / Factors that increase risk for Heart Disease:  Excess body fat around the waist Waist circumference women >35 Waist circumference for Men >40  Triglycerides > 150 HDL < 50 BP > 130/85 Glucose > 100 Goals are determined by the physician and based on other relevant data and  risk Plan Lifestyle changes  BMI reviewed     Risk Calculator: [email or web address]  BP Education: <120/80; any elevation will warrant further checks  BP can be elevated by lack of quality sleep OTC cold medications  Hormone Therapy post menopause  Eating to much sodium Processed foods (daily intake 2500mg  or one teaspoon of salt) Drinking to much alcohol  Smoking Lack of physical activity  Barriers to successful management: None noted  Stress; (1-5) Risk and reduction techniques reviewed   Fat free or low fat dairy products Fish high in omega-3 acids ( salmon, tuna, trout) Fruits, such as apples, bananas, oranges, pears, prunes Legumes, such as kidney beans, lentils, checkpeas, black-eyed peas and lima beans Vegetables; broccoli, cabbage, carrots Whole grains;   Plant fats; low sugar         Fall Risk Fall Risk  05/03/2015 06/12/2014 08/10/2013  Falls in the past year? No No No   Depression Screen PHQ 2/9 Scores 05/03/2015  06/12/2014 08/10/2013 07/09/2012  PHQ - 2 Score 0 0 0 0     Cognitive Testing MMSE - Mini Mental State Exam 05/03/2015  Not completed: Unable to complete    Immunization History  Administered Date(s) Administered  . Influenza Split 10/01/2012  . Influenza Whole 10/01/2009  . Influenza, High Dose Seasonal PF 08/31/2013  . Td 10/01/2006  . Zoster 02/09/2008   Screening Tests Health Maintenance  Topic Date Due  . PNA vac Low Risk Adult (1 of 2 - PCV13) 01/05/2008  . INFLUENZA VACCINE  07/02/2015  . MAMMOGRAM  07/26/2016  . TETANUS/TDAP  10/01/2016  . COLONOSCOPY  02/14/2018  . DEXA SCAN  Completed  . ZOSTAVAX  Completed      Plan:  Plan   The patient agrees to: use myfitnesspal to assist with weight loss  Advanced directive: given forms  Discuss with Doctor on next fup: BP elevated today; had episode of heart racing recently; thinks it is stress To be followed up at today's assessment;   Commit to Goals set;    During the course of the visit the patient was educated and counseled about the following appropriate screening and preventive services:   Vaccines to include Pneumoccal, Influenza, Hepatitis B, Td, Zostavax, HCV  Discussed Prevnar; will defer to MD; states she had pneumonia vaccine less than 10 year ago  Electrocardiogram/ 2009  Cardiovascular Disease/ reviewed risk  Colorectal cancer screening/ completed   Bone density screening; 2015  Diabetes screening/ labs to be drawn  Glaucoma screening/ vision annual and due in Sept of this year  Mammography/PAP  Nutrition counseling   Patient Instructions (the written plan) was given to the patient.   ZOXWR,UEAVW, RN  05/03/2015    Medical screening examination/treatment/procedure(s) were performed by non-physician practitioner and as supervising practitioner I was immediately available for consultation/collaboration. I agree with above. Mauricio Po, FNP

## 2015-05-03 NOTE — Assessment & Plan Note (Addendum)
1) Anticipatory Guidance: Discussed importance of wearing a seatbelt while driving and not texting while driving; changing batteries in smoke detector at least once annually; wearing suntan lotion when outside; eating a balanced and moderate diet; getting physical activity at least 30 minutes per day. Decreasing risk for falls.   2) Immunizations / Screenings / Labs:  Declines Prevnar vaccination today. Other immunizations are up-to-date per recommendations. All screenings are up-to-date per recommendations. Obtain CBC, CMET, Lipid profile and TSH.   Overall well exam. Patient has risk factors for cardiovascular disease including hypertension and hyperlipidemia. Hypertension appears to be fairly well controlled with current regimen with the average blood pressure just slightly higher than goal of 140/90. Continue to monitor blood pressure at home and follow-up in one month to determine need for additional blood pressure medications. Hypercholesterolemia currently controlled with lifestyle changes as patient is intolerant to statins. Continue working to improve nutrient density of diet and decreasing saturated fats, increasing physical activity to 30 minutes most days of the week. Patient notes some increased anxiety secondary to caring for her husband. Discussed coping mechanisms and provided information regarding stress relief. Follow up prevention exam in 1 year. Follow-up office visit pending lab work.

## 2015-05-03 NOTE — Patient Instructions (Addendum)
Virginia [name] , Thank you for taking time to come for your Medicare Wellness Visit. I appreciate your ongoing commitment to your health goals. Please review the following plan we discussed and let me know if I can assist you in the future.   The patient will discuss prevnar with Doctor; fast heart rate  Agrees to try [email or web address] Can call [place]/ Pastoral dept for assistance with AD if needed Will set a goal to assist with planning for weight loss and to get through the 1st week;   These are the goals we discussed: Goals    . Record weight daily     Agrees to document food intake via [email or web address]  Controllable CV risk reviewed for goal setting: Includes; family hx; HTN; decreased renal function; obesity; sedentary lifestyle Educated regarding Metabolic syndrome / Factors that increase risk for Heart Disease:  Excess body fat around the waist Waist circumference women >35 Waist circumference for Men >40  Triglycerides > 150 HDL < 50 BP > 130/85 Glucose > 100 Goals are determined by the physician and based on other relevant data and risk Plan Lifestyle changes  BMI reviewed     Risk Calculator: [email or web address]  BP Education: <120/80; any elevation will warrant further checks  BP can be elevated by lack of quality sleep OTC cold medications  Hormone Therapy post menopause  Eating to much sodium Processed foods (daily intake $RemoveBefor'2500mg'zPwgRohmejjU$  or one teaspoon of salt) Drinking to much alcohol  Smoking Lack of physical activity  Barriers to successful management: None noted  Stress; (1-5) Risk and reduction techniques reviewed   Fat free or low fat dairy products Fish high in omega-3 acids ( salmon, tuna, trout) Fruits, such as apples, bananas, oranges, pears, prunes Legumes, such as kidney beans, lentils, checkpeas, black-eyed peas and lima beans Vegetables; broccoli, cabbage, carrots Whole grains;   Plant fats; low sugar           This is a list of the screening recommended for you and due dates:  Health Maintenance  Topic Date Due  . Pneumonia vaccines (1 of 2 - PCV13) 01/05/2008  . Flu Shot  07/02/2015  . Mammogram  07/26/2016  . Tetanus Vaccine  10/01/2016  . Colon Cancer Screening  02/14/2018  . DEXA scan (bone density measurement)  Completed  . Shingles Vaccine  Completed     Health Maintenance Adopting a healthy lifestyle and getting preventive care can go a long way to promote health and wellness. Talk with your health care provider about what schedule of regular examinations is right for you. This is a good chance for you to check in with your provider about disease prevention and staying healthy. In between checkups, there are plenty of things you can do on your own. Experts have done a lot of research about which lifestyle changes and preventive measures are most likely to keep you healthy. Ask your health care provider for more information. WEIGHT AND DIET  Eat a healthy diet  Be sure to include plenty of vegetables, fruits, low-fat dairy products, and lean protein.  Do not eat a lot of foods high in solid fats, added sugars, or salt.  Get regular exercise. This is one of the most important things you can do for your health.  Most adults should exercise for at least 150 minutes each week. The exercise should increase your heart rate and make you sweat (moderate-intensity exercise).  Most adults should also do strengthening exercises at least twice a week. This is in  addition to the moderate-intensity exercise.  Maintain a healthy weight  Body mass index (BMI) is a measurement that can be used to identify possible weight problems. It estimates body fat based on height and weight. Your health care provider can help determine your BMI and help you achieve or maintain a healthy weight.  For females 67 years of age and older:   A BMI below 18.5 is considered underweight.  A BMI of 18.5 to 24.9 is  normal.  A BMI of 25 to 29.9 is considered overweight.  A BMI of 30 and above is considered obese.  Watch levels of cholesterol and blood lipids  You should start having your blood tested for lipids and cholesterol at 72 years of age, then have this test every 5 years.  You may need to have your cholesterol levels checked more often if:  Your lipid or cholesterol levels are high.  You are older than 72 years of age.  You are at high risk for heart disease.  CANCER SCREENING   Lung Cancer  Lung cancer screening is recommended for adults 73-30 years old who are at high risk for lung cancer because of a history of smoking.  A yearly low-dose CT scan of the lungs is recommended for people who:  Currently smoke.  Have quit within the past 15 years.  Have at least a 30-pack-year history of smoking. A pack year is smoking an average of one pack of cigarettes a day for 1 year.  Yearly screening should continue until it has been 15 years since you quit.  Yearly screening should stop if you develop a health problem that would prevent you from having lung cancer treatment.  Breast Cancer  Practice breast self-awareness. This means understanding how your breasts normally appear and feel.  It also means doing regular breast self-exams. Let your health care provider know about any changes, no matter how small.  If you are in your 20s or 30s, you should have a clinical breast exam (CBE) by a health care provider every 1-3 years as part of a regular health exam.  If you are 69 or older, have a CBE every year. Also consider having a breast X-ray (mammogram) every year.  If you have a family history of breast cancer, talk to your health care provider about genetic screening.  If you are at high risk for breast cancer, talk to your health care provider about having an MRI and a mammogram every year.  Breast cancer gene (BRCA) assessment is recommended for women who have family members  with BRCA-related cancers. BRCA-related cancers include:  Breast.  Ovarian.  Tubal.  Peritoneal cancers.  Results of the assessment will determine the need for genetic counseling and BRCA1 and BRCA2 testing. Cervical Cancer Routine pelvic examinations to screen for cervical cancer are no longer recommended for nonpregnant women who are considered low risk for cancer of the pelvic organs (ovaries, uterus, and vagina) and who do not have symptoms. A pelvic examination may be necessary if you have symptoms including those associated with pelvic infections. Ask your health care provider if a screening pelvic exam is right for you.   The Pap test is the screening test for cervical cancer for women who are considered at risk.  If you had a hysterectomy for a problem that was not cancer or a condition that could lead to cancer, then you no longer need Pap tests.  If you are older than 65 years, and you have had normal  Pap tests for the past 10 years, you no longer need to have Pap tests.  If you have had past treatment for cervical cancer or a condition that could lead to cancer, you need Pap tests and screening for cancer for at least 20 years after your treatment.  If you no longer get a Pap test, assess your risk factors if they change (such as having a new sexual partner). This can affect whether you should start being screened again.  Some women have medical problems that increase their chance of getting cervical cancer. If this is the case for you, your health care provider may recommend more frequent screening and Pap tests.  The human papillomavirus (HPV) test is another test that may be used for cervical cancer screening. The HPV test looks for the virus that can cause cell changes in the cervix. The cells collected during the Pap test can be tested for HPV.  The HPV test can be used to screen women 49 years of age and older. Getting tested for HPV can extend the interval between normal  Pap tests from three to five years.  An HPV test also should be used to screen women of any age who have unclear Pap test results.  After 72 years of age, women should have HPV testing as often as Pap tests.  Colorectal Cancer  This type of cancer can be detected and often prevented.  Routine colorectal cancer screening usually begins at 72 years of age and continues through 72 years of age.  Your health care provider may recommend screening at an earlier age if you have risk factors for colon cancer.  Your health care provider may also recommend using home test kits to check for hidden blood in the stool.  A small camera at the end of a tube can be used to examine your colon directly (sigmoidoscopy or colonoscopy). This is done to check for the earliest forms of colorectal cancer.  Routine screening usually begins at age 8.  Direct examination of the colon should be repeated every 5-10 years through 72 years of age. However, you may need to be screened more often if early forms of precancerous polyps or small growths are found. Skin Cancer  Check your skin from head to toe regularly.  Tell your health care provider about any new moles or changes in moles, especially if there is a change in a mole's shape or color.  Also tell your health care provider if you have a mole that is larger than the size of a pencil eraser.  Always use sunscreen. Apply sunscreen liberally and repeatedly throughout the day.  Protect yourself by wearing long sleeves, pants, a wide-brimmed hat, and sunglasses whenever you are outside. HEART DISEASE, DIABETES, AND HIGH BLOOD PRESSURE   Have your blood pressure checked at least every 1-2 years. High blood pressure causes heart disease and increases the risk of stroke.  If you are between 31 years and 55 years old, ask your health care provider if you should take aspirin to prevent strokes.  Have regular diabetes screenings. This involves taking a blood  sample to check your fasting blood sugar level.  If you are at a normal weight and have a low risk for diabetes, have this test once every three years after 71 years of age.  If you are overweight and have a high risk for diabetes, consider being tested at a younger age or more often. PREVENTING INFECTION  Hepatitis B  If you have  a higher risk for hepatitis B, you should be screened for this virus. You are considered at high risk for hepatitis B if:  You were born in a country where hepatitis B is common. Ask your health care provider which countries are considered high risk.  Your parents were born in a high-risk country, and you have not been immunized against hepatitis B (hepatitis B vaccine).  You have HIV or AIDS.  You use needles to inject street drugs.  You live with someone who has hepatitis B.  You have had sex with someone who has hepatitis B.  You get hemodialysis treatment.  You take certain medicines for conditions, including cancer, organ transplantation, and autoimmune conditions. Hepatitis C  Blood testing is recommended for:  Everyone born from [birth year] through 1963.  Anyone with known risk factors for hepatitis C. Sexually transmitted infections (STIs)  You should be screened for sexually transmitted infections (STIs) including gonorrhea and chlamydia if:  You are sexually active and are younger than 72 years of age.  You are older than 72 years of age and your health care provider tells you that you are at risk for this type of infection.  Your sexual activity has changed since you were last screened and you are at an increased risk for chlamydia or gonorrhea. Ask your health care provider if you are at risk.  If you do not have HIV, but are at risk, it may be recommended that you take a prescription medicine daily to prevent HIV infection. This is called pre-exposure prophylaxis (PrEP). You are considered at risk if:  You are sexually active and do not  regularly use condoms or know the HIV status of your partner(s).  You take drugs by injection.  You are sexually active with a partner who has HIV. Talk with your health care provider about whether you are at high risk of being infected with HIV. If you choose to begin PrEP, you should first be tested for HIV. You should then be tested every 3 months for as long as you are taking PrEP.  PREGNANCY   If you are premenopausal and you may become pregnant, ask your health care provider about preconception counseling.  If you may become pregnant, take 400 to 800 micrograms (mcg) of folic acid every day.  If you want to prevent pregnancy, talk to your health care provider about birth control (contraception). OSTEOPOROSIS AND MENOPAUSE   Osteoporosis is a disease in which the bones lose minerals and strength with aging. This can result in serious bone fractures. Your risk for osteoporosis can be identified using a bone density scan.  If you are 38 years of age or older, or if you are at risk for osteoporosis and fractures, ask your health care provider if you should be screened.  Ask your health care provider whether you should take a calcium or vitamin D supplement to lower your risk for osteoporosis.  Menopause may have certain physical symptoms and risks.  Hormone replacement therapy may reduce some of these symptoms and risks. Talk to your health care provider about whether hormone replacement therapy is right for you.  HOME CARE INSTRUCTIONS   Schedule regular health, dental, and eye exams.  Stay current with your immunizations.   Do not use any tobacco products including cigarettes, chewing tobacco, or electronic cigarettes.  If you are pregnant, do not drink alcohol.  If you are breastfeeding, limit how much and how often you drink alcohol.  Limit alcohol intake to no  more than 1 drink per day for nonpregnant women. One drink equals 12 ounces of beer, 5 ounces of wine, or 1  ounces of hard liquor.  Do not use street drugs.  Do not share needles.  Ask your health care provider for help if you need support or information about quitting drugs.  Tell your health care provider if you often feel depressed.  Tell your health care provider if you have ever been abused or do not feel safe at home. Document Released: 06/02/2011 Document Revised: 04/03/2014 Document Reviewed: 10/19/2013 [facility] Patient Information 2015 [place], [place]. This information is not intended to replace advice given to you by your health care provider. Make sure you discuss any questions you have with your health care provider.  Stress and Stress Management Stress is a normal reaction to life events. It is what you feel when life demands more than you are used to or more than you can handle. Some stress can be useful. For example, the stress reaction can help you catch the last bus of the day, study for a test, or meet a deadline at work. But stress that occurs too often or for too long can cause problems. It can affect your emotional health and interfere with relationships and normal daily activities. Too much stress can weaken your immune system and increase your risk for physical illness. If you already have a medical problem, stress can make it worse. CAUSES  All sorts of life events may cause stress. An event that causes stress for one person may not be stressful for another person. Major life events commonly cause stress. These may be positive or negative. Examples include losing your job, moving into a new home, getting married, having a baby, or losing a loved one. Less obvious life events may also cause stress, especially if they occur day after day or in combination. Examples include working long hours, driving in traffic, caring for children, being in debt, or being in a difficult relationship. SIGNS AND SYMPTOMS Stress may cause emotional symptoms including, the following:  Anxiety. This  is feeling worried, afraid, on edge, overwhelmed, or out of control.  Anger. This is feeling irritated or impatient.  Depression. This is feeling sad, down, helpless, or guilty.  Difficulty focusing, remembering, or making decisions. Stress may cause physical symptoms, including the following:   Aches and pains. These may affect your head, neck, back, stomach, or other areas of your body.  Tight muscles or clenched jaw.  Low energy or trouble sleeping. Stress may cause unhealthy behaviors, including the following:   Eating to feel better (overeating) or skipping meals.  Sleeping too little, too much, or both.  Working too much or putting off tasks (procrastination).  Smoking, drinking alcohol, or using drugs to feel better. DIAGNOSIS  Stress is diagnosed through an assessment by your health care provider. Your health care provider will ask questions about your symptoms and any stressful life events.Your health care provider will also ask about your medical history and may order blood tests or other tests. Certain medical conditions and medicine can cause physical symptoms similar to stress. Mental illness can cause emotional symptoms and unhealthy behaviors similar to stress. Your health care provider may refer you to a mental health professional for further evaluation.  TREATMENT  Stress management is the recommended treatment for stress.The goals of stress management are reducing stressful life events and coping with stress in healthy ways.  Techniques for reducing stressful life events include the following:  Stress  identification. Self-monitor for stress and identify what causes stress for you. These skills may help you to avoid some stressful events.  Time management. Set your priorities, keep a calendar of events, and learn to say "no." These tools can help you avoid making too many commitments. Techniques for coping with stress include the following:  Rethinking the  problem. Try to think realistically about stressful events rather than ignoring them or overreacting. Try to find the positives in a stressful situation rather than focusing on the negatives.  Exercise. Physical exercise can release both physical and emotional tension. The key is to find a form of exercise you enjoy and do it regularly.  Relaxation techniques. These relax the body and mind. Examples include yoga, meditation, tai chi, biofeedback, deep breathing, progressive muscle relaxation, listening to music, being out in nature, journaling, and other hobbies. Again, the key is to find one or more that you enjoy and can do regularly.  Healthy lifestyle. Eat a balanced diet, get plenty of sleep, and do not smoke. Avoid using alcohol or drugs to relax.  Strong support network. Spend time with family, friends, or other people you enjoy being around.Express your feelings and talk things over with someone you trust. Counseling or talktherapy with a mental health professional may be helpful if you are having difficulty managing stress on your own. Medicine is typically not recommended for the treatment of stress.Talk to your health care provider if you think you need medicine for symptoms of stress. HOME CARE INSTRUCTIONS  Keep all follow-up visits as directed by your health care provider.  Take all medicines as directed by your health care provider. SEEK MEDICAL CARE IF:  Your symptoms get worse or you start having new symptoms.  You feel overwhelmed by your problems and can no longer manage them on your own. SEEK IMMEDIATE MEDICAL CARE IF:  You feel like hurting yourself or someone else. Document Released: 05/13/2001 Document Revised: 04/03/2014 Document Reviewed: 07/12/2013 [facility] Patient Information 2015 [place], [place]. This information is not intended to replace advice given to you by your health care provider. Make sure you discuss any questions you have with your health care  provider.

## 2015-05-03 NOTE — Assessment & Plan Note (Signed)
Reviewed and updated patient's medical, surgical, family and social history. Medications and allergies were also reviewed. Basic screenings for depression, activities of daily living, hearing, cognition and safety were performed. Provider list was updated and health plan was provided to the patient.  

## 2015-05-04 ENCOUNTER — Other Ambulatory Visit (INDEPENDENT_AMBULATORY_CARE_PROVIDER_SITE_OTHER): Payer: Medicare Other

## 2015-05-04 DIAGNOSIS — I10 Essential (primary) hypertension: Secondary | ICD-10-CM | POA: Diagnosis not present

## 2015-05-04 DIAGNOSIS — Z0000 Encounter for general adult medical examination without abnormal findings: Secondary | ICD-10-CM

## 2015-05-04 DIAGNOSIS — E785 Hyperlipidemia, unspecified: Secondary | ICD-10-CM | POA: Diagnosis not present

## 2015-05-04 LAB — COMPREHENSIVE METABOLIC PANEL
ALBUMIN: 4 g/dL (ref 3.5–5.2)
ALK PHOS: 40 U/L (ref 39–117)
ALT: 17 U/L (ref 0–35)
AST: 15 U/L (ref 0–37)
BUN: 23 mg/dL (ref 6–23)
CALCIUM: 10 mg/dL (ref 8.4–10.5)
CO2: 29 mEq/L (ref 19–32)
Chloride: 104 mEq/L (ref 96–112)
Creatinine, Ser: 1.05 mg/dL (ref 0.40–1.20)
GFR: 54.7 mL/min — ABNORMAL LOW (ref >60.00)
Glucose, Bld: 111 mg/dL — ABNORMAL HIGH (ref 70–99)
POTASSIUM: 4.8 meq/L (ref 3.5–5.1)
SODIUM: 139 meq/L (ref 135–145)
Total Bilirubin: 0.7 mg/dL (ref 0.2–1.2)
Total Protein: 6.7 g/dL (ref 6.0–8.3)

## 2015-05-04 LAB — LIPID PANEL
CHOLESTEROL: 233 mg/dL — AB (ref 0–200)
HDL: 45.3 mg/dL (ref >39.00)
LDL Cholesterol: 149 mg/dL — ABNORMAL HIGH (ref 0–99)
NonHDL: 187.7
Total CHOL/HDL Ratio: 5
Triglycerides: 194 mg/dL — ABNORMAL HIGH (ref 0.0–149.0)
VLDL: 38.8 mg/dL (ref 0.0–40.0)

## 2015-05-04 LAB — TSH: TSH: 2.29 u[IU]/mL (ref 0.35–4.50)

## 2015-05-04 LAB — CBC
HEMATOCRIT: 41.4 % (ref 36.0–46.0)
HEMOGLOBIN: 13.9 g/dL (ref 12.0–15.0)
MCHC: 33.7 g/dL (ref 30.0–36.0)
MCV: 87.3 fl (ref 78.0–100.0)
Platelets: 226 10*3/uL (ref 150.0–400.0)
RBC: 4.74 Mil/uL (ref 3.87–5.11)
RDW: 13.8 % (ref 11.5–15.5)
WBC: 7.2 10*3/uL (ref 4.0–10.5)

## 2015-05-05 ENCOUNTER — Telehealth: Payer: Self-pay | Admitting: Family

## 2015-05-05 NOTE — Telephone Encounter (Signed)
Please inform patient that her lab results show that her liver function, electrolytes, white/red blood cells and thyroid are all within the normal limits. Her kidney function is slightly decreased from previous placing her in the early stages of chronic kidney disease Stage III. The best things that we can do to protect her kidneys are to ensure that we reduce her risk factors for cardiovascular disease including controlling her blood pressure and other risk factors. We simply continue to monitor this for now. As for her cholesterol, her HDL or good cholesterol is 45 which helps to reduce her cardiovascular risk factors, however her LDL is 149 with a goal of <100 for the bad cholesterol. Based on your current risk factors, I would like to start you on a medication called Zetia to help reduce your risk factors. I also recommend increasing the nutrient density of your diet through increased fruit, vegetable and fiber intake while decreasing saturated fats and processed foods. Also working towards 30 minutes of moderate physical activity most days of the week. Please let me know if willing to trial this medication which is NOT a statin.

## 2015-05-07 NOTE — Telephone Encounter (Signed)
LVM for pt to call back.

## 2015-05-08 NOTE — Telephone Encounter (Signed)
Please call patient. She returned your call

## 2015-05-08 NOTE — Telephone Encounter (Signed)
Returned pt's call.

## 2015-05-09 MED ORDER — EZETIMIBE 10 MG PO TABS: 10.0000 mg | ORAL_TABLET | Freq: Every day | ORAL | Status: DC

## 2015-05-09 NOTE — Telephone Encounter (Signed)
Pt informed of results.

## 2015-05-09 NOTE — Telephone Encounter (Signed)
Patient is calling back. If you don't get her on [phone], please call [phone]

## 2015-07-04 ENCOUNTER — Ambulatory Visit (INDEPENDENT_AMBULATORY_CARE_PROVIDER_SITE_OTHER): Payer: Medicare Other | Admitting: Internal Medicine

## 2015-07-04 ENCOUNTER — Encounter: Payer: Self-pay | Admitting: Internal Medicine

## 2015-07-04 VITALS — BP 148/72 | HR 71 | Temp 98.0°F | Resp 18 | Wt 182.0 lb

## 2015-07-04 DIAGNOSIS — T148 Other injury of unspecified body region: Secondary | ICD-10-CM | POA: Diagnosis not present

## 2015-07-04 DIAGNOSIS — W57XXXA Bitten or stung by nonvenomous insect and other nonvenomous arthropods, initial encounter: Secondary | ICD-10-CM | POA: Diagnosis not present

## 2015-07-04 DIAGNOSIS — L309 Dermatitis, unspecified: Secondary | ICD-10-CM

## 2015-07-04 MED ORDER — MOMETASONE FUROATE 0.1 % EX OINT: TOPICAL_OINTMENT | Freq: Two times a day (BID) | CUTANEOUS | Status: DC

## 2015-07-04 NOTE — Progress Notes (Signed)
Pre visit review using our clinic review tool, if applicable. No additional management support is needed unless otherwise documented below in the visit note. 

## 2015-07-04 NOTE — Progress Notes (Signed)
   Subjective:    Patient ID: Virginia [name], female    DOB: 26-Apr-1941, 74 y.o.   MRN: [id number]  HPI  She's had a rash around the neck for 1.5 weeks. 2 weeks ago she was in [place] where the weather was cool  ;when she came home to the heat and humidity she noted the rash . It's mildly itchy. It's in a linear fashion in the creases of the neck circumferentially . She's used over-the-counter lotions and also applied an antibiotic ointment at least once.  She believes she may have been bitten by an insect or other vector 8/2 on the right shin    Review of Systems  No associated itchy, watery eyes.  Swelling of the lips or tongue or intraoral lesions denied.  Shortness of breath, wheezing, or cough absent.  No vesicles, pustules or urticaria noted.  Fever ,chills , or sweats denied.   Diarrhea not present.  No dysuria, pyuria or hematuria.     Objective:   Physical Exam  Pertinent or positive findings include: Linear dermatitis in the creases of the neck circumferentially. This is dry & papular without associated vesicles or pustules. She has 2 violaceous papules over the right shin. One measures 8 X 8 mm and the other 17 x 7 mm.  General appearance :adequately nourished; in no distress.  Eyes: No conjunctival inflammation or scleral icterus is present.  Oral exam:  Lips and gums are healthy appearing.There is no oropharyngeal erythema or exudate noted. Dental hygiene is good.  Heart:  Normal rate and regular rhythm. S1 and S2 normal without gallop, murmur, click, rub or other extra sounds    Lungs:Chest clear to auscultation; no wheezes, rhonchi,rales ,or rubs present.No increased work of breathing.   Vascular : all pulses equal ; no bruits present.  Skin:Warm & dry ; no tenting or jaundice   Lymphatic: No lymphadenopathy is noted about the head, neck, axilla   Neuro: Strength, tone  normal.        Assessment & Plan:  #1 dermatitis in the folds of the neck  attributed to excessive sweating  #2 insect bites right shin with allergic type reaction  See orders and after visit summary

## 2015-07-04 NOTE — Patient Instructions (Signed)
  Please continue a moisturizing soap rather than bar soap.

## 2015-07-19 ENCOUNTER — Encounter: Payer: Medicare Other | Admitting: Internal Medicine

## 2015-07-26 ENCOUNTER — Other Ambulatory Visit: Payer: Self-pay | Admitting: Internal Medicine

## 2015-07-31 LAB — HM MAMMOGRAPHY

## 2015-08-09 ENCOUNTER — Ambulatory Visit: Payer: Medicare Other | Admitting: Family

## 2015-08-13 ENCOUNTER — Ambulatory Visit (INDEPENDENT_AMBULATORY_CARE_PROVIDER_SITE_OTHER): Payer: Medicare Other | Admitting: Family

## 2015-08-13 ENCOUNTER — Telehealth: Payer: Self-pay | Admitting: Family

## 2015-08-13 ENCOUNTER — Other Ambulatory Visit (INDEPENDENT_AMBULATORY_CARE_PROVIDER_SITE_OTHER): Payer: Medicare Other

## 2015-08-13 ENCOUNTER — Encounter: Payer: Self-pay | Admitting: Family

## 2015-08-13 VITALS — BP 148/94 | HR 58 | Temp 98.0°F | Resp 18 | Ht 64.0 in | Wt 184.0 lb

## 2015-08-13 DIAGNOSIS — N183 Chronic kidney disease, stage 3 unspecified: Secondary | ICD-10-CM

## 2015-08-13 DIAGNOSIS — I10 Essential (primary) hypertension: Secondary | ICD-10-CM

## 2015-08-13 DIAGNOSIS — Z23 Encounter for immunization: Secondary | ICD-10-CM | POA: Diagnosis not present

## 2015-08-13 DIAGNOSIS — Z7189 Other specified counseling: Secondary | ICD-10-CM

## 2015-08-13 DIAGNOSIS — Z712 Person consulting for explanation of examination or test findings: Secondary | ICD-10-CM | POA: Insufficient documentation

## 2015-08-13 DIAGNOSIS — N189 Chronic kidney disease, unspecified: Secondary | ICD-10-CM | POA: Insufficient documentation

## 2015-08-13 DIAGNOSIS — E785 Hyperlipidemia, unspecified: Secondary | ICD-10-CM | POA: Diagnosis not present

## 2015-08-13 LAB — BASIC METABOLIC PANEL
BUN: 17 mg/dL (ref 6–23)
CALCIUM: 11 mg/dL — AB (ref 8.4–10.5)
CHLORIDE: 105 meq/L (ref 96–112)
CO2: 31 mEq/L (ref 19–32)
Creatinine, Ser: 0.9 mg/dL (ref 0.40–1.20)
GFR: 65.3 mL/min (ref >60.00)
Glucose, Bld: 94 mg/dL (ref 70–99)
Potassium: 4.7 mEq/L (ref 3.5–5.1)
SODIUM: 142 meq/L (ref 135–145)

## 2015-08-13 MED ORDER — ENALAPRIL MALEATE 5 MG PO TABS: 5.0000 mg | ORAL_TABLET | Freq: Every day | ORAL | Status: DC

## 2015-08-13 NOTE — Assessment & Plan Note (Signed)
CKD Stage 3 with previous results. Emphasized importance of control of CAD risk factors to prevent progression of CKD. Start enalapril for kidney protection and lower blood pressure. Obtain BMET to check current kidney function.

## 2015-08-13 NOTE — Assessment & Plan Note (Signed)
Previous blood work reveals CAD risk of 13% in the next 10 years and recommended to start Zetia secondary to statin intolerance. Patient declined. Emphasized importance of control of CAD risk factors to prevent progression of CKD. Patient indicates that she would like to continue to work on diet and exercise at this time. Information regarding low cholesterol provided. Follow up in 3 months to recheck cholesterol.

## 2015-08-13 NOTE — Progress Notes (Signed)
Subjective:    Patient ID: Virginia [name], female    DOB: July 16, 1941, 74 y.o.   MRN: [id number]  Chief Complaint  Patient presents with  . Follow-up    From physical 3 months ago    HPI:  Virginia [name] is a 74 y.o. female with a PMH of uterine fibroids, hyperlipidemia, hypertension, osteoarthritis, osteopenia, and chronic kidney disease who presents today for a follow up office visit.     1.) Hypertension - Currently maintained on metoprolol. Takes her medications as prescribed and denies any side effects or hypotensive events. Reports that her average blood pressure at home remains above the goal of 140/90 and does face family stressors as her husband is facing challenges with dementia.   BP Readings from Last 3 Encounters:  08/13/15 148/94  07/04/15 148/72  05/03/15 160/80    2.) Hyperlipidemia - Previously noted to have hyperlipidemia on her last blood work and had a calculated CAD risk of 13% in the next 10 years. Does have intolerance to statins and recommended starting Zetia which patient declined at the time.   Lab Results  Component Value Date   CHOL 233* 05/04/2015   HDL 45.30 05/04/2015   LDLCALC 149* 05/04/2015   LDLDIRECT 146.9 08/22/2013   TRIG 194.0* 05/04/2015   CHOLHDL 5 05/04/2015   3.) Chronic kidney disease Stage III - Recently found to have a GFR of 54 during her last blood work consistent with CKD Stage 3. BUN and creatinine were normal. Denies any changes in urination. Has several questions regarding CKD Stage 3.   Allergies  Allergen Reactions  . Penicillins Hives  . Statins Other (See Comments)    pain in base of thumbs    Current Outpatient Prescriptions on File Prior to Visit  Medication Sig Dispense Refill  . aspirin 81 MG tablet Take 81 mg by mouth daily.      . Choline Fenofibrate (FENOFIBRIC ACID) 135 MG CPDR Take 1 capsule by mouth daily. 90 capsule 1  . loratadine (CLARITIN) 10 MG tablet Take 10 mg by mouth daily.    .  metoprolol (LOPRESSOR) 50 MG tablet TAKE 1 TABLET BY MOUTH TWICE A DAY 180 tablet 1  . Omega-3 Fatty Acids (FISH OIL) 1000 MG CAPS Take 1,000-2,000 mg by mouth 2 (two) times daily. 2000 mg in the morning and 1000 mg at night     No current facility-administered medications on file prior to visit.    Past Medical History  Diagnosis Date  . DIVERTICULITIS, HX OF   . FIBROIDS, UTERUS     s/p fibroidectomy  . 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 ALLERGIC RHINITIS   . OSTEOARTHRITIS   . GERD   . HYPERLIPIDEMIA     intol of statins (myalgia, thumb pain)  . HYPERTENSION   . Osteopenia 06/21/2014    DEXA@LB  06/14/14: -1.6     Review of Systems  Constitutional: Negative for fever and chills.  Eyes:       Negative for changes in vision.  Respiratory: Negative for chest tightness.   Cardiovascular: Negative for chest pain, palpitations and leg swelling.  Neurological: Negative for headaches.      Objective:    BP 148/94 mmHg  Pulse 58  Temp(Src) 98 F (36.7 C) (Oral)  Resp 18  Ht 5\' 4"  (1.626 m)  Wt 184 lb (83.462 kg)  BMI 31.57 kg/m2  SpO2 97% Nursing note and vital signs reviewed.  Physical Exam  Constitutional: She is  oriented to person, place, and time. She appears well-developed and well-nourished. No distress.  Cardiovascular: Normal rate, regular rhythm, normal heart sounds and intact distal pulses.   Pulmonary/Chest: Effort normal and breath sounds normal.  Neurological: She is alert and oriented to person, place, and time.  Skin: Skin is warm and dry.  Psychiatric: She has a normal mood and affect. Her behavior is normal. Judgment and thought content normal.       Assessment & Plan:   Problem List Items Addressed This Visit      Cardiovascular and Mediastinum   Essential hypertension - Primary    Hypertension remains uncontrolled with current regimen and above goal of 140/90 consistently. Continue current dosage of metoprolol and start  enalapril. Continue to monitor blood pressure at home and follow up in 3 weeks for nurse visit.       Relevant Medications   enalapril (VASOTEC) 5 MG tablet   Other Relevant Orders   Basic Metabolic Panel (BMET) (Completed)     Genitourinary   Chronic kidney disease    CKD Stage 3 with previous results. Emphasized importance of control of CAD risk factors to prevent progression of CKD. Start enalapril for kidney protection and lower blood pressure. Obtain BMET to check current kidney function.       Relevant Medications   enalapril (VASOTEC) 5 MG tablet   Other Relevant Orders   Basic Metabolic Panel (BMET) (Completed)     Other   Hyperlipidemia    Previous blood work reveals CAD risk of 13% in the next 10 years and recommended to start Zetia secondary to statin intolerance. Patient declined. Emphasized importance of control of CAD risk factors to prevent progression of CKD. Patient indicates that she would like to continue to work on diet and exercise at this time. Information regarding low cholesterol provided. Follow up in 3 months to recheck cholesterol.       Relevant Medications   enalapril (VASOTEC) 5 MG tablet   Encounter to discuss test results

## 2015-08-13 NOTE — Telephone Encounter (Signed)
Please inform patient that her lab work shows that her kidney function has indeed improved slightly to 65 which places back in chronic kidney disease Stage 2, however this does not change our discussion as she is still straddling the line of Stage 2 and 3 and the best thing for her now is to protect her kidneys by reducing her blood pressure, good nutrition and physical activity.

## 2015-08-13 NOTE — Progress Notes (Signed)
Pre visit review using our clinic review tool, if applicable. No additional management support is needed unless otherwise documented below in the visit note. 

## 2015-08-13 NOTE — Assessment & Plan Note (Signed)
Hypertension remains uncontrolled with current regimen and above goal of 140/90 consistently. Continue current dosage of metoprolol and start enalapril. Continue to monitor blood pressure at home and follow up in 3 weeks for nurse visit.

## 2015-08-13 NOTE — Patient Instructions (Addendum)
Thank you for choosing [facility].  Summary/Instructions:  Please let us know if you develop a cough.   Your prescription(s) have been submitted to your pharmacy or been printed and provided for you. Please take as directed and contact our office if you believe you are having problem(s) with the medication(s) or have any questions.  Please stop by the lab on the basement level of the building for your blood work. Your results will be released to [facility] (or called to you) after review, usually within 72 hours after test completion. If any changes need to be made, you will be notified at that same time.  If your symptoms worsen or fail to improve, please contact our office for further instruction, or in case of emergency go directly to the emergency room at the closest medical facility.   Fat and Cholesterol Control Diet Fat and cholesterol levels in your blood and organs are influenced by your diet. High levels of fat and cholesterol may lead to diseases of the heart, small and large blood vessels, gallbladder, liver, and pancreas. CONTROLLING FAT AND CHOLESTEROL WITH DIET Although exercise and lifestyle factors are important, your diet is key. That is because certain foods are known to raise cholesterol and others to lower it. The goal is to balance foods for their effect on cholesterol and more importantly, to replace saturated and trans fat with other types of fat, such as monounsaturated fat, polyunsaturated fat, and omega-3 fatty acids. On average, a person should consume no more than 15 to 17 g of saturated fat daily. Saturated and trans fats are considered "bad" fats, and they will raise LDL cholesterol. Saturated fats are primarily found in animal products such as meats, butter, and cream. However, that does not mean you need to give up all your favorite foods. Today, there are good tasting, low-fat, low-cholesterol substitutes for most of the things you like to eat. Choose low-fat or  nonfat alternatives. Choose round or loin cuts of red meat. These types of cuts are lowest in fat and cholesterol. Chicken (without the skin), fish, veal, and ground [place] breast are great choices. Eliminate fatty meats, such as hot dogs and salami. Even shellfish have little or no saturated fat. Have a 3 oz (85 g) portion when you eat lean meat, poultry, or fish. Trans fats are also called "partially hydrogenated oils." They are oils that have been scientifically manipulated so that they are solid at room temperature resulting in a longer shelf life and improved taste and texture of foods in which they are added. Trans fats are found in stick margarine, some tub margarines, cookies, crackers, and baked goods.  When baking and cooking, oils are a great substitute for butter. The monounsaturated oils are especially beneficial since it is believed they lower LDL and raise HDL. The oils you should avoid entirely are saturated tropical oils, such as coconut and palm.  Remember to eat a lot from food groups that are naturally free of saturated and trans fat, including fish, fruit, vegetables, beans, grains (barley, rice, couscous, bulgur wheat), and pasta (without cream sauces).  IDENTIFYING FOODS THAT LOWER FAT AND CHOLESTEROL  Soluble fiber may lower your cholesterol. This type of fiber is found in fruits such as apples, vegetables such as broccoli, potatoes, and carrots, legumes such as beans, peas, and lentils, and grains such as barley. Foods fortified with plant sterols (phytosterol) may also lower cholesterol. You should eat at least 2 g per day of these foods for a cholesterol  lowering effect.  Read package labels to identify low-saturated fats, trans fat free, and low-fat foods at the supermarket. Select cheeses that have only 2 to 3 g saturated fat per ounce. Use a heart-healthy tub margarine that is free of trans fats or partially hydrogenated oil. When buying baked goods (cookies, crackers), avoid  partially hydrogenated oils. Breads and muffins should be made from whole grains (whole-wheat or whole oat flour, instead of "flour" or "enriched flour"). Buy non-creamy canned soups with reduced salt and no added fats.  FOOD PREPARATION TECHNIQUES  Never deep-fry. If you must fry, either stir-fry, which uses very little fat, or use non-stick cooking sprays. When possible, broil, bake, or roast meats, and steam vegetables. Instead of putting butter or margarine on vegetables, use lemon and herbs, applesauce, and cinnamon (for squash and sweet potatoes). Use nonfat yogurt, salsa, and low-fat dressings for salads.  LOW-SATURATED FAT / LOW-FAT FOOD SUBSTITUTES Meats / Saturated Fat (g)  Avoid: Steak, marbled (3 oz/85 g) / 11 g  Choose: Steak, lean (3 oz/85 g) / 4 g  Avoid: Hamburger (3 oz/85 g) / 7 g  Choose: Hamburger, lean (3 oz/85 g) / 5 g  Avoid: Ham (3 oz/85 g) / 6 g  Choose: Ham, lean cut (3 oz/85 g) / 2.4 g  Avoid: Chicken, with skin, dark meat (3 oz/85 g) / 4 g  Choose: Chicken, skin removed, dark meat (3 oz/85 g) / 2 g  Avoid: Chicken, with skin, light meat (3 oz/85 g) / 2.5 g  Choose: Chicken, skin removed, light meat (3 oz/85 g) / 1 g Dairy / Saturated Fat (g)  Avoid: Whole milk (1 cup) / 5 g  Choose: Low-fat milk, 2% (1 cup) / 3 g  Choose: Low-fat milk, 1% (1 cup) / 1.5 g  Choose: Skim milk (1 cup) / 0.3 g  Avoid: Hard cheese (1 oz/28 g) / 6 g  Choose: Skim milk cheese (1 oz/28 g) / 2 to 3 g  Avoid: Cottage cheese, 4% fat (1 cup) / 6.5 g  Choose: Low-fat cottage cheese, 1% fat (1 cup) / 1.5 g  Avoid: Ice cream (1 cup) / 9 g  Choose: Sherbet (1 cup) / 2.5 g  Choose: Nonfat frozen yogurt (1 cup) / 0.3 g  Choose: Frozen fruit bar / trace  Avoid: Whipped cream (1 tbs) / 3.5 g  Choose: Nondairy whipped topping (1 tbs) / 1 g Condiments / Saturated Fat (g)  Avoid: Mayonnaise (1 tbs) / 2 g  Choose: Low-fat mayonnaise (1 tbs) / 1 g  Avoid: Butter (1 tbs) /  7 g  Choose: Extra light margarine (1 tbs) / 1 g  Avoid: Coconut oil (1 tbs) / 11.8 g  Choose: Olive oil (1 tbs) / 1.8 g  Choose: Corn oil (1 tbs) / 1.7 g  Choose: Safflower oil (1 tbs) / 1.2 g  Choose: Sunflower oil (1 tbs) / 1.4 g  Choose: Soybean oil (1 tbs) / 2.4 g  Choose: Canola oil (1 tbs) / 1 g Document Released: 11/17/2005 Document Revised: 03/14/2013 Document Reviewed: 02/15/2014 ExitCare Patient Information 2015 [employer], [place]. This information is not intended to replace advice given to you by your health care provider. Make sure you discuss any questions you have with your health care provider.

## 2015-08-14 NOTE — Telephone Encounter (Signed)
Please inform patient that her lab work shows that her kidney function has indeed improved slightly to 65 which places back in chronic kidney disease Stage 2, however this does not change our discussion as she is still straddling the line of Stage 2 and 3 and the best thing for her now is to protect her kidneys by reducing her blood pressure, good nutrition and physical activity.

## 2015-08-14 NOTE — Telephone Encounter (Signed)
No notes were made for this pt?

## 2015-08-16 DIAGNOSIS — Z23 Encounter for immunization: Secondary | ICD-10-CM | POA: Diagnosis not present

## 2015-08-16 NOTE — Addendum Note (Signed)
Addended by: Delice Bison E on: 08/16/2015 10:05 AM   Modules accepted: Orders

## 2015-08-16 NOTE — Telephone Encounter (Signed)
Pt aware of results 

## 2015-09-06 ENCOUNTER — Encounter: Payer: Self-pay | Admitting: Internal Medicine

## 2015-09-14 ENCOUNTER — Other Ambulatory Visit: Payer: Self-pay | Admitting: Internal Medicine

## 2015-10-17 ENCOUNTER — Ambulatory Visit (INDEPENDENT_AMBULATORY_CARE_PROVIDER_SITE_OTHER): Payer: Medicare Other | Admitting: Internal Medicine

## 2015-10-17 ENCOUNTER — Encounter: Payer: Self-pay | Admitting: Internal Medicine

## 2015-10-17 VITALS — BP 136/66 | HR 62 | Temp 98.5°F | Resp 16 | Wt 183.0 lb

## 2015-10-17 DIAGNOSIS — E785 Hyperlipidemia, unspecified: Secondary | ICD-10-CM

## 2015-10-17 DIAGNOSIS — Z23 Encounter for immunization: Secondary | ICD-10-CM

## 2015-10-17 DIAGNOSIS — N182 Chronic kidney disease, stage 2 (mild): Secondary | ICD-10-CM

## 2015-10-17 DIAGNOSIS — I10 Essential (primary) hypertension: Secondary | ICD-10-CM

## 2015-10-17 DIAGNOSIS — R1310 Dysphagia, unspecified: Secondary | ICD-10-CM | POA: Insufficient documentation

## 2015-10-17 NOTE — Progress Notes (Signed)
Pre visit review using our clinic review tool, if applicable. No additional management support is needed unless otherwise documented below in the visit note. 

## 2015-10-17 NOTE — Progress Notes (Signed)
Subjective:    Patient ID: Virginia [name], female    DOB: 02-07-1941, 74 y.o.   MRN: [id number]  HPI She is here to establish with a new pcp. She has concerns regarding her blood pressure.   Hypertension: She is taking her medication daily. She was prescribed enalapril recently by Marya Amsler, but never took it.  She is fairly compliant with a low sodium diet.  She denies chest pain, palpitations, edema, shortness of breath and regular headaches. She is exercising some, but not as much as she should and wants to.  She does monitor her blood pressure at home and it runs similar to what it has been here over the past year.    Hyperlipidemia: She is taking her medication daily. She is compliant with a low fat/cholesterol diet. She is exercising minimally. She wants to lose weight, but has not been successful in her attempts.  She knows she needs to decrease the amount of food she eats.    Medications and allergies reviewed with patient and updated if appropriate.  Patient Active Problem List   Diagnosis Date Noted  . Chronic kidney disease 08/13/2015  . Routine general medical examination at a health care facility 05/03/2015  . Medicare annual wellness visit, subsequent 05/03/2015  . Osteopenia 06/21/2014  . Back pain   . Sciatica of right side 05/12/2011  . 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 OSTEOARTHRITIS 01/01/2010  . FIBROIDS, UTERUS 12/13/2009  . Hyperlipidemia 12/13/2009  . Essential hypertension 12/13/2009  . ALLERGIC RHINITIS 12/13/2009  . GERD 12/13/2009  . COLONIC POLYPS, HX OF 12/13/2009    Current Outpatient Prescriptions on File Prior to Visit  Medication Sig Dispense Refill  . aspirin 81 MG tablet Take 81 mg by mouth daily.      . Choline Fenofibrate (FENOFIBRIC ACID) 135 MG CPDR Take 1 capsule by mouth daily. 90 capsule 1  . loratadine (CLARITIN) 10 MG tablet Take 10 mg by mouth daily.    . metoprolol (LOPRESSOR) 50 MG tablet TAKE 1 TABLET BY MOUTH TWICE A DAY 180 tablet  1  . Omega-3 Fatty Acids (FISH OIL) 1000 MG CAPS Take 1,000-2,000 mg by mouth 2 (two) times daily. 2000 mg in the morning and 1000 mg at night     No current facility-administered medications on file prior to visit.    Past Medical History  Diagnosis Date  . DIVERTICULITIS, HX OF   . FIBROIDS, UTERUS     s/p fibroidectomy  . 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 ALLERGIC RHINITIS   . OSTEOARTHRITIS   . GERD   . HYPERLIPIDEMIA     intol of statins (myalgia, thumb pain)  . HYPERTENSION   . Osteopenia 06/21/2014    DEXA@LB  06/14/14: -1.6    Past Surgical History  Procedure Laterality Date  . Child birth  35 & 22  . Low back surgery  01/1994    2 bulging disc  . Tonsillectomy  1968  . Fibroidectomy      Social History   Social History  . Marital Status: Married    Spouse Name: N/A  . Number of Children: N/A  . Years of Education: N/A   Social History Main Topics  . Smoking status: Never Smoker   . 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 Alcohol Use: No  . Drug Use: No  . Sexual Activity: Not Asked   Other Topics Concern  .  None   Social History Narrative   Lives with spouse / 2 children;   One in [place] and one is in [place]    Review of Systems  Constitutional: Negative for fever, chills and fatigue.  HENT: Positive for postnasal drip and trouble swallowing (occasional).   Eyes: Negative for visual disturbance.  Respiratory: Positive for cough (dry related to PND). Negative for shortness of breath and wheezing.   Cardiovascular: Positive for palpitations (occasionally with anxiety). Negative for chest pain and leg swelling.  Neurological: Negative for dizziness, light-headedness and numbness.  Psychiatric/Behavioral: Positive for dysphoric mood (mild). The patient is nervous/anxious (situational).        Objective:   Filed Vitals:   10/17/15 1034  BP: 136/66  Pulse: 62  Temp:  98.5 F (36.9 C)  Resp: 16   Filed Weights   10/17/15 1034  Weight: 183 lb (83.008 kg)   Body mass index is 31.4 kg/(m^2).   Physical Exam Constitutional: Appears well-developed and well-nourished. No distress.  Neck: Neck supple. No tracheal deviation present. No thyromegaly present.  No carotid bruit. No cervical adenopathy.   Cardiovascular: Normal rate, regular rhythm and normal heart sounds.   No murmur heard. Pulmonary/Chest: Effort normal and breath sounds normal. No respiratory distress. No wheezes.  Musculoskeletal: No edema.          Assessment & Plan:   See Problem List.   Flu vaccine given today  Follow up in 6 months sooner if needed

## 2015-10-17 NOTE — Assessment & Plan Note (Signed)
Continue fibrate, has not tolerated statins in past Increase exercise Work on weight loss Fairly compliant with a low fat/chol diet

## 2015-10-17 NOTE — Assessment & Plan Note (Signed)
Mild, stable - GFR mid 60's Did not take the enalapril prescribed Stressed blood pressure control Work on weight loss Low sodium diet.

## 2015-10-17 NOTE — Assessment & Plan Note (Signed)
Occasional difficulty swallowing/food gets stuck in esophagus Either vomits needs to go to the emergency room Last emergency room visit early 2016 She tries to take precaution with smaller bites, but usually triggered by anxiety or stress

## 2015-10-17 NOTE — Assessment & Plan Note (Signed)
BP Readings from Last 3 Encounters:  10/17/15 136/66  08/13/15 148/94  07/04/15 148/72   Borderline high, consistent with home readings Did not take enalapril and would like to avoid additional meds/work on lifestyle Increase exercise, decrease portions, low sodium diet F/u in 6 months - if still borderline high will recommend adjusting medication

## 2015-10-17 NOTE — Patient Instructions (Signed)
  We have reviewed your prior records including labs and tests today.   All other Health Maintenance issues reviewed.   All recommended immunizations and age-appropriate screenings are up-to-date.  Flu vaccine administered today.   Medications reviewed and updated.  No changes recommended at this time.   Please schedule followup in 6 months       

## 2016-01-21 ENCOUNTER — Other Ambulatory Visit: Payer: Self-pay | Admitting: Internal Medicine

## 2016-03-13 ENCOUNTER — Other Ambulatory Visit: Payer: Self-pay | Admitting: Internal Medicine

## 2016-04-16 ENCOUNTER — Ambulatory Visit (INDEPENDENT_AMBULATORY_CARE_PROVIDER_SITE_OTHER): Payer: Medicare Other | Admitting: Internal Medicine

## 2016-04-16 ENCOUNTER — Encounter: Payer: Self-pay | Admitting: Internal Medicine

## 2016-04-16 VITALS — BP 152/80 | HR 77 | Temp 98.3°F | Resp 16 | Wt 182.0 lb

## 2016-04-16 DIAGNOSIS — I10 Essential (primary) hypertension: Secondary | ICD-10-CM

## 2016-04-16 DIAGNOSIS — N182 Chronic kidney disease, stage 2 (mild): Secondary | ICD-10-CM

## 2016-04-16 DIAGNOSIS — M79644 Pain in right finger(s): Secondary | ICD-10-CM

## 2016-04-16 DIAGNOSIS — R739 Hyperglycemia, unspecified: Secondary | ICD-10-CM

## 2016-04-16 DIAGNOSIS — K5900 Constipation, unspecified: Secondary | ICD-10-CM | POA: Insufficient documentation

## 2016-04-16 DIAGNOSIS — M79645 Pain in left finger(s): Secondary | ICD-10-CM

## 2016-04-16 DIAGNOSIS — E785 Hyperlipidemia, unspecified: Secondary | ICD-10-CM | POA: Diagnosis not present

## 2016-04-16 NOTE — Assessment & Plan Note (Signed)
cmp

## 2016-04-16 NOTE — Assessment & Plan Note (Signed)
Will check lipid panel at next visit Increase exercise Continue fenofibrate

## 2016-04-16 NOTE — Assessment & Plan Note (Signed)
Check a1c Work on exercise and weight loss

## 2016-04-16 NOTE — Patient Instructions (Addendum)
Docusate or colace - stool softener. You can take 1-3 a day.     Test(s) ordered today. Your results will be released to [facility] (or called to you) after review, usually within 72hours after test completion. If any changes need to be made, you will be notified at that same time.   No immunizations administered today.   Medications reviewed and updated.  No changes recommended at this time.   Please followup in 6 months for a physical

## 2016-04-16 NOTE — Assessment & Plan Note (Signed)
Slightly elevated here, but better controlled when checked other places Advised her to monitor at a pharmacy or at home Increase exercise Weight loss Continue current medications cmp

## 2016-04-16 NOTE — Progress Notes (Signed)
Subjective:    Patient ID: Virginia [name], female    DOB: October 28, 1941, 75 y.o.   MRN: [id number]  HPI She is here for follow up.  Hypertension: She is taking her medication daily. She is compliant with a low sodium diet.  She denies chest pain, palpitations, edema, shortness of breath and regular headaches. She is not exercising regularly.  She does not monitor her blood pressure at home, but had a home visit from [employer] - 130/82.    Hyperlipidemia: She is taking her medication daily. She is compliant with a low fat/cholesterol diet. She is not exercising regularly. She denies myalgias.   B/l thumb pain:  She has episodes of days of constant aching.  She is not sure what causes it or stops it.  Any jerk of her thumb will cause a sharp pain in the thumb.  Sometimes picking up something or grasping something will cause pain.  It loses its strength sometimes.    Constipation, stomach ache:  Occuring for one week.  Some stools have been hard.  Has gone a few days without bowel movement.  She was regular prior to this.  She has not had to take any medication in the past.  She denies a change in activity, water intake or diet.  She thinks it may be related to increased anxiety.    The nurse that visited her from her insurance company said her ears were full of wax.  She denies ear pain and hearing problems, but on occasion they feel stopped up.   Medications and allergies reviewed with patient and updated if appropriate.  Patient Active Problem List   Diagnosis Date Noted  . Dysphagia 10/17/2015  . Chronic kidney disease 08/13/2015  . Routine general medical examination at a health care facility 05/03/2015  . Medicare annual wellness visit, subsequent 05/03/2015  . Osteopenia 06/21/2014  . Back pain   . Sciatica of right side 05/12/2011  . 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 OSTEOARTHRITIS 01/01/2010  . FIBROIDS, UTERUS 12/13/2009  . Hyperlipidemia 12/13/2009  . Essential  hypertension 12/13/2009  . ALLERGIC RHINITIS 12/13/2009  . COLONIC POLYPS, HX OF 12/13/2009    Current Outpatient Prescriptions on File Prior to Visit  Medication Sig Dispense Refill  . aspirin 81 MG tablet Take 81 mg by mouth daily.      . Choline Fenofibrate (FENOFIBRIC ACID) 135 MG CPDR TAKE 1 CAPSULE BY MOUTH DAILY. 90 capsule 1  . loratadine (CLARITIN) 10 MG tablet Take 10 mg by mouth daily.    . metoprolol (LOPRESSOR) 50 MG tablet TAKE 1 TABLET BY MOUTH TWICE A DAY 180 tablet 1  . Omega-3 Fatty Acids (FISH OIL) 1000 MG CAPS Take 1,000-2,000 mg by mouth 2 (two) times daily. 2000 mg in the morning and 1000 mg at night     No current facility-administered medications on file prior to visit.    Past Medical History  Diagnosis Date  . DIVERTICULITIS, HX OF   . FIBROIDS, UTERUS     s/p fibroidectomy  . 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 ALLERGIC RHINITIS   . OSTEOARTHRITIS   . GERD   . HYPERLIPIDEMIA     intol of statins (myalgia, thumb pain)  . HYPERTENSION   . Osteopenia 06/21/2014    DEXA@LB  06/14/14: -1.6    Past Surgical History  Procedure Laterality Date  . Child birth  81 & 12  . Low back surgery  01/1994  2 bulging disc  . Tonsillectomy  1968  . Fibroidectomy      Social History   Social History  . Marital Status: Married    Spouse Name: N/A  . Number of Children: N/A  . Years of Education: N/A   Social History Main Topics  . Smoking status: Never Smoker   . 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 Alcohol Use: No  . Drug Use: No  . Sexual Activity: Not on file   Other Topics Concern  . Not on file   Social History Narrative   Lives with spouse / 2 children;   One in [place] and one is in [place]   Retired Pharmacist, hospital    Family History  Problem Relation Age of Onset  . Hyperlipidemia Brother   . Arthritis Mother   . Diabetes Maternal Aunt   . Hyperlipidemia Cousin   .  Heart disease Cousin   . Diabetes Paternal Aunt   . Coronary artery disease Father   . Colon cancer Neg Hx     Review of Systems  Constitutional: Negative for fever.  HENT: Negative for ear pain, hearing loss and trouble swallowing.   Respiratory: Positive for cough (allergy related) and shortness of breath (with certain activities). Negative for wheezing.   Cardiovascular: Negative for chest pain, palpitations and leg swelling.  Gastrointestinal: Positive for abdominal pain (mild - related to constipation) and constipation.  Neurological: Negative for light-headedness and headaches.       Objective:   Filed Vitals:   04/16/16 1335  BP: 152/80  Pulse: 77  Temp: 98.3 F (36.8 C)  Resp: 16   Filed Weights   04/16/16 1335  Weight: 182 lb (82.555 kg)   Body mass index is 31.22 kg/(m^2).   Physical Exam  Constitutional: She appears well-developed and well-nourished. No distress.  HENT:  Head: Normocephalic and atraumatic.  Right Ear: External ear normal.  Left Ear: External ear normal.  B/l ear canals with excessive cerumen  Cardiovascular: Normal rate, regular rhythm and normal heart sounds.   No murmur heard. Pulmonary/Chest: Effort normal and breath sounds normal. No respiratory distress. She has no wheezes. She has no rales.  Musculoskeletal: She exhibits no edema.  No thumb swelling or deformities  Skin: She is not diaphoretic.    Patient consented to an ear lavage and taylor, cma, lavaged her ears without complication.        Assessment & Plan:    See Problem List for Assessment and Plan of chronic medical problems.

## 2016-04-16 NOTE — Assessment & Plan Note (Signed)
Acute Related to anxiety Will take a stool softener for the next couple of weeks until stress is reduced - adjust as needed Call with questions, concerns

## 2016-04-16 NOTE — Assessment & Plan Note (Signed)
Likely arthritis Discussed symptomatic treatment

## 2016-04-16 NOTE — Progress Notes (Signed)
Pre visit review using our clinic review tool, if applicable. No additional management support is needed unless otherwise documented below in the visit note. 

## 2016-04-18 ENCOUNTER — Other Ambulatory Visit (INDEPENDENT_AMBULATORY_CARE_PROVIDER_SITE_OTHER): Payer: Medicare Other

## 2016-04-18 DIAGNOSIS — R739 Hyperglycemia, unspecified: Secondary | ICD-10-CM

## 2016-04-18 DIAGNOSIS — N182 Chronic kidney disease, stage 2 (mild): Secondary | ICD-10-CM

## 2016-04-18 DIAGNOSIS — E785 Hyperlipidemia, unspecified: Secondary | ICD-10-CM

## 2016-04-18 DIAGNOSIS — I10 Essential (primary) hypertension: Secondary | ICD-10-CM | POA: Diagnosis not present

## 2016-04-18 LAB — COMPREHENSIVE METABOLIC PANEL
ALT: 22 U/L (ref 0–35)
AST: 18 U/L (ref 0–37)
Albumin: 3.9 g/dL (ref 3.5–5.2)
Alkaline Phosphatase: 38 U/L — ABNORMAL LOW (ref 39–117)
BUN: 20 mg/dL (ref 6–23)
CALCIUM: 10.3 mg/dL (ref 8.4–10.5)
CHLORIDE: 106 meq/L (ref 96–112)
CO2: 30 meq/L (ref 19–32)
Creatinine, Ser: 0.95 mg/dL (ref 0.40–1.20)
GFR: 61.24 mL/min (ref >60.00)
Glucose, Bld: 135 mg/dL — ABNORMAL HIGH (ref 70–99)
Potassium: 4.5 mEq/L (ref 3.5–5.1)
Sodium: 141 mEq/L (ref 135–145)
Total Bilirubin: 0.5 mg/dL (ref 0.2–1.2)
Total Protein: 6.2 g/dL (ref 6.0–8.3)

## 2016-04-18 LAB — HEMOGLOBIN A1C: Hgb A1c MFr Bld: 6.4 % (ref 4.6–6.5)

## 2016-04-19 ENCOUNTER — Encounter: Payer: Self-pay | Admitting: Internal Medicine

## 2016-04-19 DIAGNOSIS — R7303 Prediabetes: Secondary | ICD-10-CM | POA: Insufficient documentation

## 2016-04-19 DIAGNOSIS — E119 Type 2 diabetes mellitus without complications: Secondary | ICD-10-CM | POA: Insufficient documentation

## 2016-04-19 DIAGNOSIS — E1169 Type 2 diabetes mellitus with other specified complication: Secondary | ICD-10-CM | POA: Insufficient documentation

## 2016-07-28 ENCOUNTER — Other Ambulatory Visit: Payer: Self-pay | Admitting: Internal Medicine

## 2016-08-20 LAB — HM MAMMOGRAPHY

## 2016-08-27 ENCOUNTER — Encounter: Payer: Self-pay | Admitting: Internal Medicine

## 2016-08-28 ENCOUNTER — Ambulatory Visit: Payer: Medicare Other | Admitting: Internal Medicine

## 2016-09-04 ENCOUNTER — Ambulatory Visit: Payer: Medicare Other | Admitting: Internal Medicine

## 2016-09-06 ENCOUNTER — Other Ambulatory Visit: Payer: Self-pay | Admitting: Internal Medicine

## 2016-10-16 ENCOUNTER — Encounter: Payer: Medicare Other | Admitting: Internal Medicine

## 2016-11-12 ENCOUNTER — Ambulatory Visit (INDEPENDENT_AMBULATORY_CARE_PROVIDER_SITE_OTHER): Payer: Medicare Other | Admitting: Internal Medicine

## 2016-11-12 ENCOUNTER — Encounter: Payer: Self-pay | Admitting: Internal Medicine

## 2016-11-12 VITALS — BP 158/86 | HR 69 | Temp 98.0°F | Resp 16 | Ht 64.0 in | Wt 176.0 lb

## 2016-11-12 DIAGNOSIS — N182 Chronic kidney disease, stage 2 (mild): Secondary | ICD-10-CM | POA: Diagnosis not present

## 2016-11-12 DIAGNOSIS — I10 Essential (primary) hypertension: Secondary | ICD-10-CM

## 2016-11-12 DIAGNOSIS — Z0000 Encounter for general adult medical examination without abnormal findings: Secondary | ICD-10-CM | POA: Diagnosis not present

## 2016-11-12 DIAGNOSIS — M8580 Other specified disorders of bone density and structure, unspecified site: Secondary | ICD-10-CM

## 2016-11-12 DIAGNOSIS — R7303 Prediabetes: Secondary | ICD-10-CM

## 2016-11-12 NOTE — Progress Notes (Signed)
Subjective:    Patient ID: Virginia [name], female    DOB: 10/18/1941, 75 y.o.   MRN: [id number]  HPI Here for medicare wellness exam and annual physical exam.   I have personally reviewed and have noted 1.The patient's medical and social history 2.Their use of alcohol, tobacco or illicit drugs 3.Their current medications and supplements 4.The patient's functional ability including ADL's, fall risks, home safety risks and hearing or visual impairment. 5.Diet and physical activities 6.Evidence for depression or mood disorders 7.Care team reviewed   - eye - dr Herbert Deaner, dentist, derm annually, GI - dr pyrtle  Are there smokers in your home (other than you)? No  Risk Factors Exercise: none, but very active Dietary issues discussed: well balanced, she has decreased sugar and cut out all soda, she has decreased carb intake  Cardiac risk factors: advanced age, hypertension, hyperlipidemia, and obesity (BMI >= 30 kg/m2).  Depression Screen  Have you felt down, depressed or hopeless? No  Have you felt little interest or pleasure in doing things?  No  Activities of Daily Living In your present state of health, do you have any difficulty performing the following activities?:  Driving? No Managing money?  No Feeding yourself? No Getting from bed to chair? No Climbing a flight of stairs? No Preparing food and eating?: No Bathing or showering? No Getting dressed: No Getting to/using the toilet? No Moving around from place to place: No In the past year have you fallen or had a near fall?: No   Are you sexually active?  No  Do you have more than one partner?  N/A  Hearing Difficulties: yes, mild Do you often ask people to speak up or repeat themselves? No Do you experience ringing or noises in your ears? No Do you have difficulty understanding soft or whispered voices? yes Vision:              Any change in vision:   no             Up to date with eye exam:   yes Memory:  Do you feel that you have a problem with memory? No  Do you often misplace items? No  Do you feel safe at home?  Yes  Cognitive Testing  Alert, Orientated? Yes  Normal Appearance? Yes  Recall of three objects?  Yes  Can perform simple calculations? Yes  Displays appropriate judgment? Yes  Can read the correct time from a watch face? Yes   Advanced Directives have been discussed with the patient? Yes - would like to revise HCP   Medications and allergies reviewed with patient and updated if appropriate.  Patient Active Problem List   Diagnosis Date Noted  . Prediabetes 04/19/2016  . Hyperglycemia 04/16/2016  . Constipation 04/16/2016  . Bilateral thumb pain 04/16/2016  . Dysphagia 10/17/2015  . Chronic kidney disease 08/13/2015  . Osteopenia 06/21/2014  . Back pain   . Sciatica of right side 05/12/2011  . 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 OSTEOARTHRITIS 01/01/2010  . FIBROIDS, UTERUS 12/13/2009  . Hyperlipidemia 12/13/2009  . Essential hypertension 12/13/2009  . ALLERGIC RHINITIS 12/13/2009  . COLONIC POLYPS, HX OF 12/13/2009    Current Outpatient Prescriptions on File Prior to Visit  Medication Sig Dispense Refill  . aspirin 81 MG tablet Take 81 mg by mouth daily.      . Choline Fenofibrate (FENOFIBRIC ACID) 135 MG CPDR Take 1 capsule by mouth daily. 90 capsule 1  . loratadine (CLARITIN) 10 MG  tablet Take 10 mg by mouth daily.    . metoprolol (LOPRESSOR) 50 MG tablet TAKE 1 TABLET BY MOUTH TWICE A DAY 180 tablet 2  . Omega-3 Fatty Acids (FISH OIL) 1000 MG CAPS Take 1,000-2,000 mg by mouth 2 (two) times daily. 2000 mg in the morning and 1000 mg at night     No current facility-administered medications on file prior to visit.     Past Medical History:  Diagnosis Date  . ALLERGIC RHINITIS   . DIVERTICULITIS, HX OF   . FIBROIDS, UTERUS    s/p fibroidectomy  . GERD   . HYPERLIPIDEMIA    intol of statins (myalgia,  thumb pain)  . HYPERTENSION   . OSTEOARTHRITIS   . Osteopenia 06/21/2014   DEXA@LB  06/14/14: -1.6  . 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 child birth  44 & 10  . Fibroidectomy    . Low back surgery  01/1994   2 bulging disc  . TONSILLECTOMY  1968    Social History   Social History  . Marital status: Married    Spouse name: N/A  . Number of children: N/A  . Years of education: N/A   Social History Main Topics  . Smoking status: Never Smoker  . 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 Alcohol use No  . Drug use: No  . Sexual activity: Not Asked   Other Topics Concern  . None   Social History Narrative   Lives with spouse / 2 children;   One in [place] and one is in [place]   Retired Pharmacist, hospital    Family History  Problem Relation Age of Onset  . Hyperlipidemia Brother   . Arthritis Mother   . Diabetes Maternal Aunt   . Hyperlipidemia Cousin   . Heart disease Cousin   . Diabetes Paternal Aunt   . Coronary artery disease Father   . Colon cancer Neg Hx     Review of Systems  Constitutional: Negative for chills and fever.  HENT: Positive for hearing loss (mild). Negative for tinnitus.   Eyes: Negative for visual disturbance.  Respiratory: Negative for cough, shortness of breath and wheezing.   Cardiovascular: Negative for chest pain, palpitations and leg swelling.  Gastrointestinal: Negative for abdominal pain, blood in stool, constipation, diarrhea and nausea.       No gerd  Genitourinary: Negative for dysuria and hematuria.  Musculoskeletal: Positive for arthralgias (thumbs) and back pain (scoliosis - sees chiropractor).  Skin: Negative for color change and rash.  Neurological: Positive for dizziness (occ). Negative for light-headedness and headaches.  Psychiatric/Behavioral: Negative for dysphoric mood. The patient is not nervous/anxious.         Objective:   Vitals:   11/12/16 1259  BP: (!) 158/86  Pulse: 69  Resp: 16  Temp: 98 F (36.7 C)   Filed Weights   11/12/16 1259  Weight: 176 lb (79.8 kg)   Body mass index is 30.21 kg/m.   Physical Exam Constitutional: She appears well-developed and well-nourished. No distress.  HENT:  Head: Normocephalic and atraumatic.  Right Ear: External ear normal. Normal ear canal and TM Left Ear: External ear normal.  Normal ear canal and TM Mouth/Throat: Oropharynx is clear and moist.  Eyes: Conjunctivae and EOM are normal.  Neck: Neck supple. No tracheal deviation present. No thyromegaly present.  No carotid  bruit  Cardiovascular: Normal rate, regular rhythm and normal heart sounds.   No murmur heard.  No edema. Pulmonary/Chest: Effort normal and breath sounds normal. No respiratory distress. She has no wheezes. She has no rales.  Breast: deferred  Abdominal: Soft. She exhibits no distension. There is no tenderness.  Lymphadenopathy: She has no cervical adenopathy.  Skin: Skin is warm and dry. She is not diaphoretic.  Psychiatric: She has a normal mood and affect. Her behavior is normal.         Assessment & Plan:   Wellness Exam: Immunizations  Flu vaccine today, td due - she will check with insurance if covered, will schedule nurse visit for pneumovax Colonoscopy   Up to date  Mammogram   Up to date  Dexa  - due next year Gyn - no longer sees Eye exam  Up to date  Hearing loss  Mild, deferred referral to audiology Memory concerns/difficulties  none Independent of ADLs  fully Stressed the importance of regular exercise   Patient received copy of preventative screening tests/immunizations recommended for the next 5-10 years.  Physical exam: Screening blood work Immunizations  Flu vaccine today, td due - she will check with insurance if covered, will schedule nurse visit for pneumovax Colonoscopy   Up to date  Mammogram   Up to date  Gyn -  No longer  sees Dexa - will do next year Eye exams  Up to date  Exercise -  encouraged regular exercise Weight - has lost weight - continue efforts Skin - sees derm annually Substance abuse none  See Problem List for Assessment and Plan of chronic medical problems.

## 2016-11-12 NOTE — Patient Instructions (Addendum)
Virginia [name] , Thank you for taking time to come for your Medicare Wellness Visit. I appreciate your ongoing commitment to your health goals. Please review the following plan we discussed and let me know if I can assist you in the future.   These are the goals we discussed: Goals    . Work on increasing exercise, start calcium 500-600 mg daily            This is a list of the screening recommended for you and due dates:  Health Maintenance  Topic Date Due  . Pneumonia vaccines (2 of 2 - PPSV23) 08/15/2016  . Tetanus Vaccine  10/01/2016  . DEXA scan (bone density measurement)  06/14/2017  . Colon Cancer Screening  02/14/2018  . Mammogram  08/20/2018  . Flu Shot  Addressed  . Shingles Vaccine  Completed     Test(s) ordered today. Your results will be released to [place] (or called to you) after review, usually within 72hours after test completion. If any changes need to be made, you will be notified at that same time.  All other Health Maintenance issues reviewed.   All recommended immunizations and age-appropriate screenings are up-to-date or discussed.  Flu immunization administered today.   Medications reviewed and updated.  No changes recommended at this time.   Please followup in 6 months   Health Maintenance, Female Introduction Adopting a healthy lifestyle and getting preventive care can go a long way to promote health and wellness. Talk with your health care provider about what schedule of regular examinations is right for you. This is a good chance for you to check in with your provider about disease prevention and staying healthy. In between checkups, there are plenty of things you can do on your own. Experts have done a lot of research about which lifestyle changes and preventive measures are most likely to keep you healthy. Ask your health care provider for more information. Weight and diet Eat a healthy diet  Be sure to include plenty of vegetables, fruits,  low-fat dairy products, and lean protein.  Do not eat a lot of foods high in solid fats, added sugars, or salt.  Get regular exercise. This is one of the most important things you can do for your health.  Most adults should exercise for at least 150 minutes each week. The exercise should increase your heart rate and make you sweat (moderate-intensity exercise).  Most adults should also do strengthening exercises at least twice a week. This is in addition to the moderate-intensity exercise. Maintain a healthy weight  Body mass index (BMI) is a measurement that can be used to identify possible weight problems. It estimates body fat based on height and weight. Your health care provider can help determine your BMI and help you achieve or maintain a healthy weight.  For females 28 years of age and older:  A BMI below 18.5 is considered underweight.  A BMI of 18.5 to 24.9 is normal.  A BMI of 25 to 29.9 is considered overweight.  A BMI of 30 and above is considered obese. Watch levels of cholesterol and blood lipids  You should start having your blood tested for lipids and cholesterol at 73 years of age, then have this test every 5 years.  You may need to have your cholesterol levels checked more often if:  Your lipid or cholesterol levels are high.  You are older than 73 years of age.  You are at high risk for heart disease. Cancer  screening Lung Cancer  Lung cancer screening is recommended for adults 24-25 years old who are at high risk for lung cancer because of a history of smoking.  A yearly low-dose CT scan of the lungs is recommended for people who:  Currently smoke.  Have quit within the past 15 years.  Have at least a 30-pack-year history of smoking. A pack year is smoking an average of one pack of cigarettes a day for 1 year.  Yearly screening should continue until it has been 15 years since you quit.  Yearly screening should stop if you develop a health problem  that would prevent you from having lung cancer treatment. Breast Cancer  Practice breast self-awareness. This means understanding how your breasts normally appear and feel.  It also means doing regular breast self-exams. Let your health care provider know about any changes, no matter how small.  If you are in your 20s or 30s, you should have a clinical breast exam (CBE) by a health care provider every 1-3 years as part of a regular health exam.  If you are 8 or older, have a CBE every year. Also consider having a breast X-ray (mammogram) every year.  If you have a family history of breast cancer, talk to your health care provider about genetic screening.  If you are at high risk for breast cancer, talk to your health care provider about having an MRI and a mammogram every year.  Breast cancer gene (BRCA) assessment is recommended for women who have family members with BRCA-related cancers. BRCA-related cancers include:  Breast.  Ovarian.  Tubal.  Peritoneal cancers.  Results of the assessment will determine the need for genetic counseling and BRCA1 and BRCA2 testing. Cervical Cancer  Your health care provider may recommend that you be screened regularly for cancer of the pelvic organs (ovaries, uterus, and vagina). This screening involves a pelvic examination, including checking for microscopic changes to the surface of your cervix (Pap test). You may be encouraged to have this screening done every 3 years, beginning at age 27.  For women ages 67-65, health care providers may recommend pelvic exams and Pap testing every 3 years, or they may recommend the Pap and pelvic exam, combined with testing for human papilloma virus (HPV), every 5 years. Some types of HPV increase your risk of cervical cancer. Testing for HPV may also be done on women of any age with unclear Pap test results.  Other health care providers may not recommend any screening for nonpregnant women who are considered  low risk for pelvic cancer and who do not have symptoms. Ask your health care provider if a screening pelvic exam is right for you.  If you have had past treatment for cervical cancer or a condition that could lead to cancer, you need Pap tests and screening for cancer for at least 20 years after your treatment. If Pap tests have been discontinued, your risk factors (such as having a new sexual partner) need to be reassessed to determine if screening should resume. Some women have medical problems that increase the chance of getting cervical cancer. In these cases, your health care provider may recommend more frequent screening and Pap tests. Colorectal Cancer  This type of cancer can be detected and often prevented.  Routine colorectal cancer screening usually begins at 73 years of age and continues through 73 years of age.  Your health care provider may recommend screening at an earlier age if you have risk factors for colon cancer.  Your health care provider may also recommend using home test kits to check for hidden blood in the stool.  A small camera at the end of a tube can be used to examine your colon directly (sigmoidoscopy or colonoscopy). This is done to check for the earliest forms of colorectal cancer.  Routine screening usually begins at age 16.  Direct examination of the colon should be repeated every 5-10 years through 73 years of age. However, you may need to be screened more often if early forms of precancerous polyps or small growths are found. Skin Cancer  Check your skin from head to toe regularly.  Tell your health care provider about any new moles or changes in moles, especially if there is a change in a mole's shape or color.  Also tell your health care provider if you have a mole that is larger than the size of a pencil eraser.  Always use sunscreen. Apply sunscreen liberally and repeatedly throughout the day.  Protect yourself by wearing long sleeves, pants, a  wide-brimmed hat, and sunglasses whenever you are outside. Heart disease, diabetes, and high blood pressure  High blood pressure causes heart disease and increases the risk of stroke. High blood pressure is more likely to develop in:  People who have blood pressure in the high end of the normal range (130-139/85-89 mm Hg).  People who are overweight or obese.  People who are African American.  If you are 78-36 years of age, have your blood pressure checked every 3-5 years. If you are 59 years of age or older, have your blood pressure checked every year. You should have your blood pressure measured twice-once when you are at a hospital or clinic, and once when you are not at a hospital or clinic. Record the average of the two measurements. To check your blood pressure when you are not at a hospital or clinic, you can use:  An automated blood pressure machine at a pharmacy.  A home blood pressure monitor.  If you are between 12 years and 61 years old, ask your health care provider if you should take aspirin to prevent strokes.  Have regular diabetes screenings. This involves taking a blood sample to check your fasting blood sugar level.  If you are at a normal weight and have a low risk for diabetes, have this test once every three years after 73 years of age.  If you are overweight and have a high risk for diabetes, consider being tested at a younger age or more often. Preventing infection Hepatitis B  If you have a higher risk for hepatitis B, you should be screened for this virus. You are considered at high risk for hepatitis B if:  You were born in a country where hepatitis B is common. Ask your health care provider which countries are considered high risk.  Your parents were born in a high-risk country, and you have not been immunized against hepatitis B (hepatitis B vaccine).  You have HIV or AIDS.  You use needles to inject street drugs.  You live with someone who has  hepatitis B.  You have had sex with someone who has hepatitis B.  You get hemodialysis treatment.  You take certain medicines for conditions, including cancer, organ transplantation, and autoimmune conditions. Hepatitis C  Blood testing is recommended for:  Everyone born from [birth year] through 1963.  Anyone with known risk factors for hepatitis C. Sexually transmitted infections (STIs)  You should be screened for sexually transmitted infections (  STIs) including gonorrhea and chlamydia if:  You are sexually active and are younger than 73 years of age.  You are older than 73 years of age and your health care provider tells you that you are at risk for this type of infection.  Your sexual activity has changed since you were last screened and you are at an increased risk for chlamydia or gonorrhea. Ask your health care provider if you are at risk.  If you do not have HIV, but are at risk, it may be recommended that you take a prescription medicine daily to prevent HIV infection. This is called pre-exposure prophylaxis (PrEP). You are considered at risk if:  You are sexually active and do not regularly use condoms or know the HIV status of your partner(s).  You take drugs by injection.  You are sexually active with a partner who has HIV. Talk with your health care provider about whether you are at high risk of being infected with HIV. If you choose to begin PrEP, you should first be tested for HIV. You should then be tested every 3 months for as long as you are taking PrEP. Pregnancy  If you are premenopausal and you may become pregnant, ask your health care provider about preconception counseling.  If you may become pregnant, take 400 to 800 micrograms (mcg) of folic acid every day.  If you want to prevent pregnancy, talk to your health care provider about birth control (contraception). Osteoporosis and menopause  Osteoporosis is a disease in which the bones lose minerals and strength  with aging. This can result in serious bone fractures. Your risk for osteoporosis can be identified using a bone density scan.  If you are 36 years of age or older, or if you are at risk for osteoporosis and fractures, ask your health care provider if you should be screened.  Ask your health care provider whether you should take a calcium or vitamin D supplement to lower your risk for osteoporosis.  Menopause may have certain physical symptoms and risks.  Hormone replacement therapy may reduce some of these symptoms and risks. Talk to your health care provider about whether hormone replacement therapy is right for you. Follow these instructions at home:  Schedule regular health, dental, and eye exams.  Stay current with your immunizations.  Do not use any tobacco products including cigarettes, chewing tobacco, or electronic cigarettes.  If you are pregnant, do not drink alcohol.  If you are breastfeeding, limit how much and how often you drink alcohol.  Limit alcohol intake to no more than 1 drink per day for nonpregnant women. One drink equals 12 ounces of beer, 5 ounces of wine, or 1 ounces of hard liquor.  Do not use street drugs.  Do not share needles.  Ask your health care provider for help if you need support or information about quitting drugs.  Tell your health care provider if you often feel depressed.  Tell your health care provider if you have ever been abused or do not feel safe at home. This information is not intended to replace advice given to you by your health care provider. Make sure you discuss any questions you have with your health care provider. Document Released: 06/02/2011 Document Revised: 04/24/2016 Document Reviewed: 08/21/2015  2017 [employer]vier

## 2016-11-12 NOTE — Assessment & Plan Note (Addendum)
Will repeat dexa next year - 2018 Encouraged regular exercise Check D level Start calcium 500-600 mg daily

## 2016-11-12 NOTE — Assessment & Plan Note (Signed)
Encouraged regular exericse Low sugar/carb diet a1c

## 2016-11-12 NOTE — Assessment & Plan Note (Signed)
BP elevated here today Will monitor at BP Continue current medication

## 2016-11-12 NOTE — Progress Notes (Signed)
Pre visit review using our clinic review tool, if applicable. No additional management support is needed unless otherwise documented below in the visit note. 

## 2016-11-12 NOTE — Assessment & Plan Note (Signed)
cmp

## 2017-01-01 ENCOUNTER — Encounter: Payer: Self-pay | Admitting: Adult Health

## 2017-01-01 ENCOUNTER — Ambulatory Visit (INDEPENDENT_AMBULATORY_CARE_PROVIDER_SITE_OTHER): Payer: Medicare Other | Admitting: Adult Health

## 2017-01-01 VITALS — BP 150/72 | Temp 97.9°F | Ht 64.0 in | Wt 179.0 lb

## 2017-01-01 DIAGNOSIS — B349 Viral infection, unspecified: Secondary | ICD-10-CM | POA: Diagnosis not present

## 2017-01-01 DIAGNOSIS — R110 Nausea: Secondary | ICD-10-CM | POA: Diagnosis not present

## 2017-01-01 LAB — POCT INFLUENZA A/B
Influenza A, POC: NEGATIVE
Influenza B, POC: NEGATIVE

## 2017-01-01 MED ORDER — ONDANSETRON HCL 4 MG PO TABS: 4.0000 mg | ORAL_TABLET | Freq: Three times a day (TID) | ORAL | 0 refills | Status: DC | PRN

## 2017-01-01 NOTE — Progress Notes (Signed)
Subjective:    Patient ID: Virginia [name], female    DOB: 07-Jun-1941, 76 y.o.   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 She presents to the office today for two weeks of fatigue,headaches, fevers at night, and nausea.   She denies any vomiting, diarrhea, productive cough, sinus pain and pressure.    Review of Systems  Constitutional: Positive for appetite change, diaphoresis, fatigue and fever. Negative for chills.  HENT: Negative.   Respiratory: Positive for cough.   Cardiovascular: Negative.   Gastrointestinal: Positive for nausea. Negative for constipation, diarrhea and vomiting.  Neurological: Positive for headaches.  All other systems reviewed and are negative.  Past Medical History:  Diagnosis Date  . ALLERGIC RHINITIS   . DIVERTICULITIS, HX OF   . FIBROIDS, UTERUS    s/p fibroidectomy  . GERD   . HYPERLIPIDEMIA    intol of statins (myalgia, thumb pain)  . HYPERTENSION   . OSTEOARTHRITIS   . Osteopenia 06/21/2014   DEXA@[facility]  06/14/14: -1.6  . SCHATZKI'S RING 11/1999, 08/2009   s/p dilation    Social History   Social History  . Marital status: Married    Spouse name: N/A  . Number of children: N/A  . Years of education: N/A   Occupational History  . Not on file.   Social History Main Topics  . Smoking status: Never Smoker  . 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 Alcohol use No  . Drug use: No  . Sexual activity: Not on file   Other Topics Concern  . Not on file   Social History Narrative   Lives with spouse / 2 children;   One in [place] and one is in [place]   Retired Pharmacist, hospital    Past Surgical History:  Procedure Laterality Date  . child birth  38 & 80  . Fibroidectomy    .  Low back surgery  01/1994   2 bulging disc  . TONSILLECTOMY  1968    Family History  Problem Relation Age of Onset  . Hyperlipidemia Brother   . Arthritis Mother   . Diabetes Maternal Aunt   . Hyperlipidemia Cousin   . Heart disease Cousin   . Diabetes Paternal Aunt   . Coronary artery disease Father   . Colon cancer Neg Hx     Allergies  Allergen Reactions  . Penicillins Hives  . Statins Other (See Comments)    pain in base of thumbs    Current Outpatient Prescriptions on File Prior to Visit  Medication Sig Dispense Refill  . aspirin 81 MG tablet Take 81 mg by mouth daily.      . Choline Fenofibrate (FENOFIBRIC ACID) 135 MG CPDR Take 1 capsule by mouth daily. 90 capsule 1  . loratadine (CLARITIN) 10 MG tablet Take 10 mg by mouth daily.    . metoprolol (LOPRESSOR) 50 MG tablet TAKE 1 TABLET BY MOUTH TWICE A DAY 180 tablet 2  . Omega-3 Fatty Acids (FISH OIL) 1000 MG CAPS Take 1,000-2,000 mg by mouth 2 (two) times daily. 2000 mg in the morning and 1000 mg at night     No current facility-administered medications on file prior to visit.     BP (!) 150/72   Temp 97.9 F (36.6 C) (  Oral)   Ht 5\' 4"  (1.626 m)   Wt 179 lb (81.2 kg)   BMI 30.73 kg/m       Objective:   Physical Exam  Constitutional: She is oriented to person, place, and time. She appears well-developed and well-nourished. No distress.  HENT:  Head: Normocephalic and atraumatic.  Right Ear: External ear normal.  Left Ear: External ear normal.  Nose: Nose normal.  Mouth/Throat: Oropharynx is clear and moist. No oropharyngeal exudate.  Eyes: Conjunctivae and EOM are normal. Pupils are equal, round, and reactive to light. Right eye exhibits no discharge. Left eye exhibits no discharge. No scleral icterus.  Neck: Normal range of motion. Neck supple. No thyromegaly present.  Cardiovascular: Normal rate, regular rhythm, normal heart sounds and intact distal pulses.  Exam reveals no gallop and no friction rub.     No murmur heard. Pulmonary/Chest: Effort normal and breath sounds normal. No respiratory distress. She has no wheezes. She has no rales. She exhibits no tenderness.  Abdominal: Soft. Bowel sounds are normal. She exhibits no distension and no mass. There is no tenderness. There is no rebound and no guarding.  Musculoskeletal: Normal range of motion. She exhibits no edema, tenderness or deformity.  Lymphadenopathy:    She has no cervical adenopathy.  Neurological: She is alert and oriented to person, place, and time.  Skin: Skin is warm and dry. No rash noted. She is not diaphoretic. No erythema. No pallor.  Psychiatric: She has a normal mood and affect. Her behavior is normal. Judgment and thought content normal.  Nursing note and vitals reviewed.     Assessment & Plan:  1. Viral illness - She exhibits vague symptoms. Unknown cause of her symptoms. Will check basic blood work.  - POC Influenza A/B - Basic metabolic panel - CBC with Differential/Platelet - Follow up with Dr.Burns if no improvement  2. Nausea - ondansetron (ZOFRAN) 4 MG tablet; Take 1 tablet (4 mg total) by mouth every 8 (eight) hours as needed for nausea or vomiting.  Dispense: 20 tablet; Refill: 0  Dorothyann Peng, NP

## 2017-01-02 LAB — CBC WITH DIFFERENTIAL/PLATELET
BASOS ABS: 0.1 10*3/uL (ref 0.0–0.1)
Basophils Relative: 1.6 % (ref 0.0–3.0)
EOS ABS: 0.1 10*3/uL (ref 0.0–0.7)
Eosinophils Relative: 1.4 % (ref 0.0–5.0)
HEMATOCRIT: 37.6 % (ref 36.0–46.0)
Hemoglobin: 12.4 g/dL (ref 12.0–15.0)
LYMPHS PCT: 60.6 % — AB (ref 12.0–46.0)
Lymphs Abs: 5.5 10*3/uL — ABNORMAL HIGH (ref 0.7–4.0)
MCHC: 33.1 g/dL (ref 30.0–36.0)
MCV: 87.5 fl (ref 78.0–100.0)
Monocytes Absolute: 0.5 10*3/uL (ref 0.1–1.0)
Monocytes Relative: 5.4 % (ref 3.0–12.0)
NEUTROS ABS: 2.8 10*3/uL (ref 1.4–7.7)
PLATELETS: 226 10*3/uL (ref 150.0–400.0)
RBC: 4.29 Mil/uL (ref 3.87–5.11)
RDW: 14.2 % (ref 11.5–15.5)
WBC: 9.1 10*3/uL (ref 4.0–10.5)

## 2017-01-02 LAB — BASIC METABOLIC PANEL
BUN: 20 mg/dL (ref 6–23)
CALCIUM: 9.8 mg/dL (ref 8.4–10.5)
CO2: 29 meq/L (ref 19–32)
CREATININE: 1.11 mg/dL (ref 0.40–1.20)
Chloride: 107 mEq/L (ref 96–112)
GFR: 51.07 mL/min — ABNORMAL LOW (ref >60.00)
Glucose, Bld: 108 mg/dL — ABNORMAL HIGH (ref 70–99)
Potassium: 4.3 mEq/L (ref 3.5–5.1)
Sodium: 141 mEq/L (ref 135–145)

## 2017-01-21 ENCOUNTER — Other Ambulatory Visit: Payer: Self-pay | Admitting: Internal Medicine

## 2017-04-18 ENCOUNTER — Other Ambulatory Visit: Payer: Self-pay | Admitting: Internal Medicine

## 2017-05-13 ENCOUNTER — Ambulatory Visit (INDEPENDENT_AMBULATORY_CARE_PROVIDER_SITE_OTHER): Payer: Medicare Other | Admitting: Internal Medicine

## 2017-05-13 ENCOUNTER — Encounter: Payer: Self-pay | Admitting: Internal Medicine

## 2017-05-13 ENCOUNTER — Other Ambulatory Visit (INDEPENDENT_AMBULATORY_CARE_PROVIDER_SITE_OTHER): Payer: Medicare Other

## 2017-05-13 VITALS — BP 124/62 | HR 64 | Temp 98.5°F | Resp 16 | Wt 173.0 lb

## 2017-05-13 DIAGNOSIS — R7303 Prediabetes: Secondary | ICD-10-CM

## 2017-05-13 DIAGNOSIS — N182 Chronic kidney disease, stage 2 (mild): Secondary | ICD-10-CM

## 2017-05-13 DIAGNOSIS — T1490XA Injury, unspecified, initial encounter: Secondary | ICD-10-CM

## 2017-05-13 DIAGNOSIS — I10 Essential (primary) hypertension: Secondary | ICD-10-CM

## 2017-05-13 DIAGNOSIS — E7800 Pure hypercholesterolemia, unspecified: Secondary | ICD-10-CM

## 2017-05-13 DIAGNOSIS — M79672 Pain in left foot: Secondary | ICD-10-CM | POA: Insufficient documentation

## 2017-05-13 DIAGNOSIS — Z23 Encounter for immunization: Secondary | ICD-10-CM

## 2017-05-13 DIAGNOSIS — W57XXXA Bitten or stung by nonvenomous insect and other nonvenomous arthropods, initial encounter: Secondary | ICD-10-CM | POA: Insufficient documentation

## 2017-05-13 LAB — COMPREHENSIVE METABOLIC PANEL
ALBUMIN: 4.1 g/dL (ref 3.5–5.2)
ALT: 18 U/L (ref 0–35)
AST: 17 U/L (ref 0–37)
Alkaline Phosphatase: 38 U/L — ABNORMAL LOW (ref 39–117)
BUN: 24 mg/dL — AB (ref 6–23)
CHLORIDE: 105 meq/L (ref 96–112)
CO2: 32 mEq/L (ref 19–32)
Calcium: 10.7 mg/dL — ABNORMAL HIGH (ref 8.4–10.5)
Creatinine, Ser: 0.91 mg/dL (ref 0.40–1.20)
GFR: 64.17 mL/min (ref >60.00)
GLUCOSE: 117 mg/dL — AB (ref 70–99)
Potassium: 4.2 mEq/L (ref 3.5–5.1)
SODIUM: 141 meq/L (ref 135–145)
Total Bilirubin: 0.6 mg/dL (ref 0.2–1.2)
Total Protein: 6.7 g/dL (ref 6.0–8.3)

## 2017-05-13 LAB — HEMOGLOBIN A1C: Hgb A1c MFr Bld: 6.1 % (ref 4.6–6.5)

## 2017-05-13 MED ORDER — ZOSTER VAC RECOMB ADJUVANTED 50 MCG/0.5ML IM SUSR: 0.5000 mL | Freq: Once | INTRAMUSCULAR | 1 refills | Status: AC

## 2017-05-13 NOTE — Assessment & Plan Note (Signed)
Check lipids Continue fenofibrate

## 2017-05-13 NOTE — Patient Instructions (Addendum)
  Test(s) ordered today. Your results will be released to [facility] (or called to you) after review, usually within 72hours after test completion. If any changes need to be made, you will be notified at that same time.  All other Health Maintenance issues reviewed.   All recommended immunizations and age-appropriate screenings are up-to-date or discussed.  pneumovax immunizations administered today.   Medications reviewed and updated.  No changes recommended at this time.    Please followup in 6 months

## 2017-05-13 NOTE — Assessment & Plan Note (Signed)
Check a1c Low sugar / carb diet Stressed regular exercise, weight loss  

## 2017-05-13 NOTE — Assessment & Plan Note (Signed)
Left dorsal foot pain Avoid constricting shoes See podiatry if no improvement

## 2017-05-13 NOTE — Assessment & Plan Note (Signed)
Mild  Check cmp

## 2017-05-13 NOTE — Progress Notes (Signed)
Subjective:    Patient ID: Virginia [name], female    DOB: Apr 02, 1941, 76 y.o.   MRN: [id number]  HPI The patient is here for follow up.  Hypertension: She is taking her medication daily. She is compliant with a low sodium diet.  She denies chest pain, palpitations, edema, shortness of breath and regular headaches. She is exercising irregularly.  She does not monitor her blood pressure at home.    Prediabetes:  She is somewhat compliant with a low sugar/carbohydrate diet.  She is exercising irregularly.  She does go out every day and shop or do errands.  CKD:  Her last kidney function showed mild decreased kidney function.  She was sick at the time with a viral infection.   Bug bite:  A few days ago she got a bug bite on the back of her right leg. It is red and itchy.  She has applied otc steroid cream and it helped.  She wondered if she should do anything else.   Foot pain:  Her left foot hurts on the dorsal aspect if she wears shoes that constrict that area.  She has to find shoes that do not do that.  She will have the pain radiate toward the toes.    Medications and allergies reviewed with patient and updated if appropriate.  Patient Active Problem List   Diagnosis Date Noted  . Prediabetes 04/19/2016  . Constipation 04/16/2016  . Bilateral thumb pain 04/16/2016  . Dysphagia 10/17/2015  . Chronic kidney disease 08/13/2015  . Osteopenia 06/21/2014  . Back pain   . Sciatica of right side 05/12/2011  . 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 OSTEOARTHRITIS 01/01/2010  . FIBROIDS, UTERUS 12/13/2009  . Hyperlipidemia 12/13/2009  . Essential hypertension 12/13/2009  . ALLERGIC RHINITIS 12/13/2009  . COLONIC POLYPS, HX OF 12/13/2009    Current Outpatient Prescriptions on File Prior to Visit  Medication Sig Dispense Refill  . aspirin 81 MG tablet Take 81 mg by mouth daily.      . Choline Fenofibrate (FENOFIBRIC ACID) 135 MG CPDR Take 1 Capsule by mouth daily 90 capsule 0  .  loratadine (CLARITIN) 10 MG tablet Take 10 mg by mouth daily.    . metoprolol (LOPRESSOR) 50 MG tablet TAKE 1 TABLET BY MOUTH TWICE A DAY 180 tablet 2  . Omega-3 Fatty Acids (FISH OIL) 1000 MG CAPS Take 1,000-2,000 mg by mouth 2 (two) times daily. 2000 mg in the morning and 1000 mg at night     No current facility-administered medications on file prior to visit.     Past Medical History:  Diagnosis Date  . ALLERGIC RHINITIS   . DIVERTICULITIS, HX OF   . FIBROIDS, UTERUS    s/p fibroidectomy  . GERD   . HYPERLIPIDEMIA    intol of statins (myalgia, thumb pain)  . HYPERTENSION   . OSTEOARTHRITIS   . Osteopenia 06/21/2014   DEXA@LB  06/14/14: -1.6  . 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 child birth  49 & 18  . Fibroidectomy    . Low back surgery  01/1994   2 bulging disc  . TONSILLECTOMY  1968    Social History   Social History  . Marital status: Married    Spouse name: N/A  . Number of children: N/A  . Years of education: N/A   Social History Main Topics  . Smoking status: Never Smoker  . 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 Alcohol use No  . Drug use: No  . Sexual activity: Not on file   Other Topics Concern  . Not on file   Social History Narrative   Lives with spouse / 2 children;   One in [place] and one is in [place]   Retired Pharmacist, hospital    Family History  Problem Relation Age of Onset  . Hyperlipidemia Brother   . Arthritis Mother   . Diabetes Maternal Aunt   . Hyperlipidemia Cousin   . Heart disease Cousin   . Diabetes Paternal Aunt   . Coronary artery disease Father   . Colon cancer Neg Hx     Review of Systems  Constitutional: Negative for chills and fever.  Respiratory: Negative for cough, shortness of breath and wheezing.   Cardiovascular: Positive for palpitations (rare palpitations/heart racing). Negative for chest  pain and leg swelling.  Neurological: Negative for light-headedness and headaches.       Objective:   Vitals:   05/13/17 1038  BP: 124/62  Pulse: 64  Resp: 16  Temp: 98.5 F (36.9 C)   Wt Readings from Last 3 Encounters:  05/13/17 173 lb (78.5 kg)  01/01/17 179 lb (81.2 kg)  11/12/16 176 lb (79.8 kg)   Body mass index is 29.7 kg/m.   Physical Exam    Constitutional: Appears well-developed and well-nourished. No distress.  HENT:  Head: Normocephalic and atraumatic.  Neck: Neck supple. No tracheal deviation present. No thyromegaly present.  No cervical lymphadenopathy Cardiovascular: Normal rate, regular rhythm and normal heart sounds.   No murmur heard. No carotid bruit .  No edema Pulmonary/Chest: Effort normal and breath sounds normal. No respiratory distress. No has no wheezes. No rales.  Msk: left dorsal foot non tender to touch, no deformity, normal sensation in foot Skin: Skin is warm and dry. Not diaphoretic.  Psychiatric: Normal mood and affect. Behavior is normal.      Assessment & Plan:   Pneumovax today  See Problem List for Assessment and Plan of chronic medical problems.

## 2017-05-13 NOTE — Assessment & Plan Note (Signed)
BP well controlled Current regimen effective and well tolerated Continue current medications at current doses cmp  

## 2017-05-13 NOTE — Assessment & Plan Note (Signed)
Does not look like a tick bite Treat with otc steroid cream BID Monitor for infection Call if no improvement

## 2017-05-14 ENCOUNTER — Encounter: Payer: Self-pay | Admitting: Internal Medicine

## 2017-06-04 ENCOUNTER — Other Ambulatory Visit: Payer: Self-pay | Admitting: Internal Medicine

## 2017-08-06 ENCOUNTER — Other Ambulatory Visit: Payer: Self-pay | Admitting: Internal Medicine

## 2017-11-02 ENCOUNTER — Ambulatory Visit: Payer: Medicare Other | Admitting: Internal Medicine

## 2017-11-13 ENCOUNTER — Other Ambulatory Visit: Payer: Self-pay | Admitting: Internal Medicine

## 2017-11-25 ENCOUNTER — Other Ambulatory Visit: Payer: Self-pay | Admitting: *Deleted

## 2017-11-25 MED ORDER — METOPROLOL TARTRATE 50 MG PO TABS: 50.0000 mg | ORAL_TABLET | Freq: Two times a day (BID) | ORAL | 0 refills | Status: DC

## 2017-11-26 ENCOUNTER — Other Ambulatory Visit: Payer: Self-pay | Admitting: Emergency Medicine

## 2017-11-26 MED ORDER — METOPROLOL TARTRATE 50 MG PO TABS: 50.0000 mg | ORAL_TABLET | Freq: Two times a day (BID) | ORAL | 0 refills | Status: DC

## 2017-12-09 ENCOUNTER — Ambulatory Visit: Payer: Medicare Other | Admitting: Internal Medicine

## 2017-12-15 NOTE — Patient Instructions (Addendum)
Monitor your BP closely at home.  It should be less than 140/90.  If it is frequently more than that you should be on another BP medication.   Test(s) ordered today. Your results will be released to [place] (or called to you) after review, usually within 72hours after test completion. If any changes need to be made, you will be notified at that same time.  Flu immunization administered today.   Medications reviewed and updated.   No changes recommended at this time.    Please followup in 6 months

## 2017-12-15 NOTE — Progress Notes (Signed)
Subjective:    Patient ID: Virginia [name], female    DOB: 07/11/1941, 77 y.o.   MRN: [id number]  HPI The patient is here for follow up.  Hypertension: She is taking her medication daily. She is compliant with a low sodium diet.  She denies chest pain, palpitations, edema, shortness of breath and regular headaches. She is not exercising regularly, except back exercises.  She is doing everything around the house.  She does monitor her blood pressure at home once in a while - it varies 135-145/?.    Hyperlipidemia: She is taking her medication daily. She is compliant with a low fat/cholesterol diet. She is not exercising regularly. She denies myalgias.   Prediabetes:  She is compliant with a low sugar/carbohydrate diet.  She knows she needs to limit her portions more.  She is exercising regularly.  Left foot pain:  She was experiencing left dorsal foot pain.  She mentioned it to her chiropractor and he did some manipulation.  It went away.    Medications and allergies reviewed with patient and updated if appropriate.  Patient Active Problem List   Diagnosis Date Noted  . Foot pain, left 05/13/2017  . Bug bite 05/13/2017  . Prediabetes 04/19/2016  . Constipation 04/16/2016  . Bilateral thumb pain 04/16/2016  . Dysphagia 10/17/2015  . Chronic kidney disease 08/13/2015  . Osteopenia 06/21/2014  . Back pain   . Sciatica of right side 05/12/2011  . 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 OSTEOARTHRITIS 01/01/2010  . FIBROIDS, UTERUS 12/13/2009  . Hyperlipidemia 12/13/2009  . Essential hypertension 12/13/2009  . ALLERGIC RHINITIS 12/13/2009  . COLONIC POLYPS, HX OF 12/13/2009    Current Outpatient Medications on File Prior to Visit  Medication Sig Dispense Refill  . aspirin 81 MG tablet Take 81 mg by mouth daily.      . Choline Fenofibrate (FENOFIBRIC ACID) 135 MG CPDR TAKE 1 CAPSULE BY MOUTH EVERY DAY 90 capsule 1  . loratadine (CLARITIN) 10 MG tablet Take 10 mg by mouth daily.    .  metoprolol tartrate (LOPRESSOR) 50 MG tablet Take 1 tablet (50 mg total) by mouth 2 (two) times daily. 90 tablet 0  . Omega-3 Fatty Acids (FISH OIL) 1000 MG CAPS Take 1,000-2,000 mg by mouth 2 (two) times daily. 2000 mg in the morning and 1000 mg at night     No current facility-administered medications on file prior to visit.     Past Medical History:  Diagnosis Date  . ALLERGIC RHINITIS   . DIVERTICULITIS, HX OF   . FIBROIDS, UTERUS    s/p fibroidectomy  . GERD   . HYPERLIPIDEMIA    intol of statins (myalgia, thumb pain)  . HYPERTENSION   . OSTEOARTHRITIS   . Osteopenia 06/21/2014   DEXA@LB  06/14/14: -1.6  . 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 child birth  75 & 82  . Fibroidectomy    . Low back surgery  01/1994   2 bulging disc  . TONSILLECTOMY  1968    Social History   Socioeconomic History  . Marital status: Married    Spouse name: None  . Number of children: None  . Years of education: None  . Highest education level: None  Social Needs  . Financial resource strain: None  . Food insecurity - worry: None  . Food insecurity - inability: None  . Transportation needs - medical: None  . Transportation needs -  non-medical: None  Occupational History  . None  Tobacco Use  . Smoking status: Never Smoker  . Smokeless tobacco: Never Used  . 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 Alcohol use: No    Alcohol/week: 0.0 oz  . Drug use: No  . Sexual activity: None  Other Topics Concern  . None  Social History Narrative   Lives with spouse / 2 children;   One in [place] and one is in [place]   Retired Pharmacist, hospital    Family History  Problem Relation Age of Onset  . Arthritis Mother   . Coronary artery disease Father   . Hyperlipidemia Brother   . Diabetes Maternal Aunt   . Hyperlipidemia Cousin   . Heart disease Cousin   . Diabetes  Paternal Aunt   . Colon cancer Neg Hx     Review of Systems  Constitutional: Negative for chills and fever.  HENT: Positive for postnasal drip.   Respiratory: Negative for cough, shortness of breath and wheezing.   Cardiovascular: Negative for chest pain, palpitations and leg swelling.  Gastrointestinal: Negative for abdominal pain.  Neurological: Negative for dizziness, light-headedness and headaches.       Objective:   Vitals:   12/16/17 1116  BP: (!) 160/76  Pulse: 66  Resp: 16  Temp: 98 F (36.7 C)  SpO2: 96%   Wt Readings from Last 3 Encounters:  12/16/17 175 lb (79.4 kg)  05/13/17 173 lb (78.5 kg)  01/01/17 179 lb (81.2 kg)   Body mass index is 30.04 kg/m.   Physical Exam    Constitutional: Appears well-developed and well-nourished. No distress.  HENT:  Head: Normocephalic and atraumatic.  Neck: Neck supple. No tracheal deviation present. No thyromegaly present.  No cervical lymphadenopathy Cardiovascular: Normal rate, regular rhythm and normal heart sounds.   No murmur heard. No carotid bruit .  No edema Pulmonary/Chest: Effort normal and breath sounds normal. No respiratory distress. No has no wheezes. No rales.  Skin: Skin is warm and dry. Not diaphoretic.  Psychiatric: Normal mood and affect. Behavior is normal.      Assessment & Plan:    See Problem List for Assessment and Plan of chronic medical problems.

## 2017-12-16 ENCOUNTER — Encounter: Payer: Self-pay | Admitting: Internal Medicine

## 2017-12-16 ENCOUNTER — Ambulatory Visit: Payer: Medicare Other | Admitting: Internal Medicine

## 2017-12-16 VITALS — BP 160/76 | HR 66 | Temp 98.0°F | Resp 16 | Wt 175.0 lb

## 2017-12-16 DIAGNOSIS — Z23 Encounter for immunization: Secondary | ICD-10-CM

## 2017-12-16 DIAGNOSIS — E7849 Other hyperlipidemia: Secondary | ICD-10-CM | POA: Diagnosis not present

## 2017-12-16 DIAGNOSIS — I10 Essential (primary) hypertension: Secondary | ICD-10-CM | POA: Diagnosis not present

## 2017-12-16 DIAGNOSIS — R7303 Prediabetes: Secondary | ICD-10-CM | POA: Diagnosis not present

## 2017-12-16 NOTE — Assessment & Plan Note (Addendum)
BP has been high - advised adding another BP medication, but she declined - she is concered about potential side effects from medication Advised that we can start at a very low dose, but she prefers to work on lifestyle changes Stressed low-sodium diet Advised regular exercise and weight loss Advised her to monitor her blood pressure at home more frequently and is frequently more than 140/90 and we need to add a second medication CMP, TSH, CBC

## 2017-12-16 NOTE — Assessment & Plan Note (Signed)
Taking fenofibrate daily Check lipid panel, CMP Encouraged regular exercise, healthy diet and weight loss

## 2017-12-16 NOTE — Assessment & Plan Note (Signed)
Check a1c Low sugar / carb diet Stressed regular exercise, weight loss  

## 2017-12-29 ENCOUNTER — Encounter: Payer: Self-pay | Admitting: Internal Medicine

## 2017-12-29 ENCOUNTER — Other Ambulatory Visit (INDEPENDENT_AMBULATORY_CARE_PROVIDER_SITE_OTHER): Payer: Medicare Other

## 2017-12-29 DIAGNOSIS — E7849 Other hyperlipidemia: Secondary | ICD-10-CM

## 2017-12-29 DIAGNOSIS — R7303 Prediabetes: Secondary | ICD-10-CM | POA: Diagnosis not present

## 2017-12-29 DIAGNOSIS — I10 Essential (primary) hypertension: Secondary | ICD-10-CM | POA: Diagnosis not present

## 2017-12-29 LAB — CBC WITH DIFFERENTIAL/PLATELET
BASOS PCT: 0.9 % (ref 0.0–3.0)
Basophils Absolute: 0.1 10*3/uL (ref 0.0–0.1)
EOS PCT: 4.2 % (ref 0.0–5.0)
Eosinophils Absolute: 0.3 10*3/uL (ref 0.0–0.7)
HCT: 42.5 % (ref 36.0–46.0)
Hemoglobin: 14.1 g/dL (ref 12.0–15.0)
LYMPHS ABS: 3 10*3/uL (ref 0.7–4.0)
Lymphocytes Relative: 46.4 % — ABNORMAL HIGH (ref 12.0–46.0)
MCHC: 33.3 g/dL (ref 30.0–36.0)
MCV: 87.8 fl (ref 78.0–100.0)
MONOS PCT: 6.7 % (ref 3.0–12.0)
Monocytes Absolute: 0.4 10*3/uL (ref 0.1–1.0)
NEUTROS ABS: 2.7 10*3/uL (ref 1.4–7.7)
Neutrophils Relative %: 41.8 % — ABNORMAL LOW (ref 43.0–77.0)
PLATELETS: 211 10*3/uL (ref 150.0–400.0)
RBC: 4.84 Mil/uL (ref 3.87–5.11)
RDW: 13.8 % (ref 11.5–15.5)
WBC: 6.6 10*3/uL (ref 4.0–10.5)

## 2017-12-29 LAB — LIPID PANEL
CHOL/HDL RATIO: 4
Cholesterol: 194 mg/dL (ref 0–200)
HDL: 44.5 mg/dL (ref >39.00)
LDL Cholesterol: 117 mg/dL — ABNORMAL HIGH (ref 0–99)
NonHDL: 149.9
TRIGLYCERIDES: 167 mg/dL — AB (ref 0.0–149.0)
VLDL: 33.4 mg/dL (ref 0.0–40.0)

## 2017-12-29 LAB — COMPREHENSIVE METABOLIC PANEL
ALK PHOS: 39 U/L (ref 39–117)
ALT: 15 U/L (ref 0–35)
AST: 13 U/L (ref 0–37)
Albumin: 4 g/dL (ref 3.5–5.2)
BUN: 23 mg/dL (ref 6–23)
CHLORIDE: 105 meq/L (ref 96–112)
CO2: 30 mEq/L (ref 19–32)
Calcium: 10 mg/dL (ref 8.4–10.5)
Creatinine, Ser: 0.93 mg/dL (ref 0.40–1.20)
GFR: 62.47 mL/min (ref >60.00)
Glucose, Bld: 104 mg/dL — ABNORMAL HIGH (ref 70–99)
POTASSIUM: 4.2 meq/L (ref 3.5–5.1)
SODIUM: 142 meq/L (ref 135–145)
TOTAL PROTEIN: 6.8 g/dL (ref 6.0–8.3)
Total Bilirubin: 0.7 mg/dL (ref 0.2–1.2)

## 2017-12-29 LAB — HEMOGLOBIN A1C: Hgb A1c MFr Bld: 5.9 % (ref 4.6–6.5)

## 2017-12-29 LAB — TSH: TSH: 3.79 u[IU]/mL (ref 0.35–4.50)

## 2018-01-31 ENCOUNTER — Other Ambulatory Visit: Payer: Self-pay | Admitting: Internal Medicine

## 2018-02-14 ENCOUNTER — Encounter: Payer: Self-pay | Admitting: Internal Medicine

## 2018-02-25 ENCOUNTER — Encounter: Payer: Self-pay | Admitting: Internal Medicine

## 2018-03-05 ENCOUNTER — Encounter: Payer: Medicare Other | Admitting: Internal Medicine

## 2018-05-10 ENCOUNTER — Ambulatory Visit (AMBULATORY_SURGERY_CENTER): Payer: Self-pay | Admitting: *Deleted

## 2018-05-10 ENCOUNTER — Other Ambulatory Visit: Payer: Self-pay | Admitting: Internal Medicine

## 2018-05-10 ENCOUNTER — Other Ambulatory Visit: Payer: Self-pay

## 2018-05-10 VITALS — Ht 64.0 in | Wt 177.6 lb

## 2018-05-10 DIAGNOSIS — Z86010 Personal history of colonic polyps: Secondary | ICD-10-CM

## 2018-05-10 MED ORDER — NA SULFATE-K SULFATE-MG SULF 17.5-3.13-1.6 GM/177ML PO SOLN: 1.0000 [IU] | Freq: Once | ORAL | 0 refills | Status: AC

## 2018-05-10 NOTE — Progress Notes (Signed)
No egg or soy allergy known to patient  No issues with past sedation with any surgeries  or procedures, no intubation problems  No diet pills per patient No home 02 use per patient  No blood thinners per patient  Pt denies issues with constipation  No A fib or A flutter  EMMI video sent to pt's e mail  

## 2018-05-19 ENCOUNTER — Ambulatory Visit (INDEPENDENT_AMBULATORY_CARE_PROVIDER_SITE_OTHER): Payer: Medicare Other | Admitting: *Deleted

## 2018-05-19 VITALS — BP 142/82 | HR 56 | Resp 18 | Ht 64.0 in | Wt 177.0 lb

## 2018-05-19 DIAGNOSIS — Z0000 Encounter for general adult medical examination without abnormal findings: Secondary | ICD-10-CM | POA: Diagnosis not present

## 2018-05-19 NOTE — Patient Instructions (Addendum)
Continue doing brain stimulating activities (puzzles, reading, adult coloring books, staying active) to keep memory sharp.   Continue to eat heart healthy diet (full of fruits, vegetables, whole grains, lean protein, water--limit salt, fat, and sugar intake) and increase physical activity as tolerated.   Virginia [name] , Thank you for taking time to come for your Medicare Wellness Visit. I appreciate your ongoing commitment to your health goals. Please review the following plan we discussed and let me know if I can assist you in the future.   These are the goals we discussed: Goals    . Patient Stated     Continue to travel with my family and my husband as long as my husband can tolerate the trip. 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    . Record weight daily     Agrees to document food intake via [email or web address]  Controllable CV risk reviewed for goal setting: Includes; family hx; HTN; decreased renal function; obesity; sedentary lifestyle Educated regarding Metabolic syndrome / Factors that increase risk for Heart Disease:  Excess body fat around the waist Waist circumference women >35 Waist circumference for Men >40  Triglycerides > 150 HDL < 50 BP > 130/85 Glucose > 100 Goals are determined by the physician and based on other relevant data and risk Plan Lifestyle changes  BMI reviewed     Risk Calculator: [email or web address]  BP Education: <120/80; any elevation will warrant further checks  BP can be elevated by lack of quality sleep OTC cold medications  Hormone Therapy post menopause  Eating to much sodium Processed foods (daily intake '2500mg'$  or one teaspoon of salt) Drinking to much alcohol  Smoking Lack of physical activity  Barriers to successful management: None noted  Stress; (1-5) Risk and reduction techniques reviewed   Fat free or low fat dairy products Fish high in omega-3 acids ( salmon, tuna,  trout) Fruits, such as apples, bananas, oranges, pears, prunes Legumes, such as kidney beans, lentils, checkpeas, black-eyed peas and lima beans Vegetables; broccoli, cabbage, carrots Whole grains;   Plant fats; low sugar          This is a list of the screening recommended for you and due dates:  Health Maintenance  Topic Date Due  . Tetanus Vaccine  10/01/2016  . Colon Cancer Screening  02/14/2018  . DEXA scan (bone density measurement)  05/20/2019*  . Flu Shot  07/01/2018  . Pneumonia vaccines  Completed  *Topic was postponed. The date shown is not the original due date.   Health Maintenance, Female Adopting a healthy lifestyle and getting preventive care can go a long way to promote health and wellness. Talk with your health care provider about what schedule of regular examinations is right for you. This is a good chance for you to check in with your provider about disease prevention and staying healthy. In between checkups, there are plenty of things you can do on your own. Experts have done a lot of research about which lifestyle changes and preventive measures are most likely to keep you healthy. Ask your health care provider for more information. Weight and diet Eat a healthy diet  Be sure to include plenty of vegetables, fruits, low-fat dairy products, and lean protein.  Do not eat a lot of foods high in solid fats, added sugars, or salt.  Get regular exercise. This is one of the most important things you can do for your health. ? Most adults should  exercise for at least 150 minutes each week. The exercise should increase your heart rate and make you sweat (moderate-intensity exercise). ? Most adults should also do strengthening exercises at least twice a week. This is in addition to the moderate-intensity exercise.  Maintain a healthy weight  Body mass index (BMI) is a measurement that can be used to identify possible weight problems. It estimates body fat based on  height and weight. Your health care provider can help determine your BMI and help you achieve or maintain a healthy weight.  For females 69 years of age and older: ? A BMI below 18.5 is considered underweight. ? A BMI of 18.5 to 24.9 is normal. ? A BMI of 25 to 29.9 is considered overweight. ? A BMI of 30 and above is considered obese.  Watch levels of cholesterol and blood lipids  You should start having your blood tested for lipids and cholesterol at 75 years of age, then have this test every 5 years.  You may need to have your cholesterol levels checked more often if: ? Your lipid or cholesterol levels are high. ? You are older than 75 years of age. ? You are at high risk for heart disease.  Cancer screening Lung Cancer  Lung cancer screening is recommended for adults 67-74 years old who are at high risk for lung cancer because of a history of smoking.  A yearly low-dose CT scan of the lungs is recommended for people who: ? Currently smoke. ? Have quit within the past 15 years. ? Have at least a 30-pack-year history of smoking. A pack year is smoking an average of one pack of cigarettes a day for 1 year.  Yearly screening should continue until it has been 15 years since you quit.  Yearly screening should stop if you develop a health problem that would prevent you from having lung cancer treatment.  Breast Cancer  Practice breast self-awareness. This means understanding how your breasts normally appear and feel.  It also means doing regular breast self-exams. Let your health care provider know about any changes, no matter how small.  If you are in your 20s or 30s, you should have a clinical breast exam (CBE) by a health care provider every 1-3 years as part of a regular health exam.  If you are 63 or older, have a CBE every year. Also consider having a breast X-ray (mammogram) every year.  If you have a family history of breast cancer, talk to your health care provider  about genetic screening.  If you are at high risk for breast cancer, talk to your health care provider about having an MRI and a mammogram every year.  Breast cancer gene (BRCA) assessment is recommended for women who have family members with BRCA-related cancers. BRCA-related cancers include: ? Breast. ? Ovarian. ? Tubal. ? Peritoneal cancers.  Results of the assessment will determine the need for genetic counseling and BRCA1 and BRCA2 testing.  Cervical Cancer Your health care provider may recommend that you be screened regularly for cancer of the pelvic organs (ovaries, uterus, and vagina). This screening involves a pelvic examination, including checking for microscopic changes to the surface of your cervix (Pap test). You may be encouraged to have this screening done every 3 years, beginning at age 8.  For women ages 54-65, health care providers may recommend pelvic exams and Pap testing every 3 years, or they may recommend the Pap and pelvic exam, combined with testing for human papilloma virus (HPV),  every 5 years. Some types of HPV increase your risk of cervical cancer. Testing for HPV may also be done on women of any age with unclear Pap test results.  Other health care providers may not recommend any screening for nonpregnant women who are considered low risk for pelvic cancer and who do not have symptoms. Ask your health care provider if a screening pelvic exam is right for you.  If you have had past treatment for cervical cancer or a condition that could lead to cancer, you need Pap tests and screening for cancer for at least 20 years after your treatment. If Pap tests have been discontinued, your risk factors (such as having a new sexual partner) need to be reassessed to determine if screening should resume. Some women have medical problems that increase the chance of getting cervical cancer. In these cases, your health care provider may recommend more frequent screening and Pap  tests.  Colorectal Cancer  This type of cancer can be detected and often prevented.  Routine colorectal cancer screening usually begins at 75 years of age and continues through 75 years of age.  Your health care provider may recommend screening at an earlier age if you have risk factors for colon cancer.  Your health care provider may also recommend using home test kits to check for hidden blood in the stool.  A small camera at the end of a tube can be used to examine your colon directly (sigmoidoscopy or colonoscopy). This is done to check for the earliest forms of colorectal cancer.  Routine screening usually begins at age 81.  Direct examination of the colon should be repeated every 5-10 years through 75 years of age. However, you may need to be screened more often if early forms of precancerous polyps or small growths are found.  Skin Cancer  Check your skin from head to toe regularly.  Tell your health care provider about any new moles or changes in moles, especially if there is a change in a mole's shape or color.  Also tell your health care provider if you have a mole that is larger than the size of a pencil eraser.  Always use sunscreen. Apply sunscreen liberally and repeatedly throughout the day.  Protect yourself by wearing long sleeves, pants, a wide-brimmed hat, and sunglasses whenever you are outside.  Heart disease, diabetes, and high blood pressure  High blood pressure causes heart disease and increases the risk of stroke. High blood pressure is more likely to develop in: ? People who have blood pressure in the high end of the normal range (130-139/85-89 mm Hg). ? People who are overweight or obese. ? People who are African American.  If you are 46-76 years of age, have your blood pressure checked every 3-5 years. If you are 39 years of age or older, have your blood pressure checked every year. You should have your blood pressure measured twice-once when you are at  a hospital or clinic, and once when you are not at a hospital or clinic. Record the average of the two measurements. To check your blood pressure when you are not at a hospital or clinic, you can use: ? An automated blood pressure machine at a pharmacy. ? A home blood pressure monitor.  If you are between 75 years and 52 years old, ask your health care provider if you should take aspirin to prevent strokes.  Have regular diabetes screenings. This involves taking a blood sample to check your fasting blood sugar  level. ? If you are at a normal weight and have a low risk for diabetes, have this test once every three years after 75 years of age. ? If you are overweight and have a high risk for diabetes, consider being tested at a younger age or more often. Preventing infection Hepatitis B  If you have a higher risk for hepatitis B, you should be screened for this virus. You are considered at high risk for hepatitis B if: ? You were born in a country where hepatitis B is common. Ask your health care provider which countries are considered high risk. ? Your parents were born in a high-risk country, and you have not been immunized against hepatitis B (hepatitis B vaccine). ? You have HIV or AIDS. ? You use needles to inject street drugs. ? You live with someone who has hepatitis B. ? You have had sex with someone who has hepatitis B. ? You get hemodialysis treatment. ? You take certain medicines for conditions, including cancer, organ transplantation, and autoimmune conditions.  Hepatitis C  Blood testing is recommended for: ? Everyone born from [birth year] through 1963. ? Anyone with known risk factors for hepatitis C.  Sexually transmitted infections (STIs)  You should be screened for sexually transmitted infections (STIs) including gonorrhea and chlamydia if: ? You are sexually active and are younger than 75 years of age. ? You are older than 75 years of age and your health care provider tells  you that you are at risk for this type of infection. ? Your sexual activity has changed since you were last screened and you are at an increased risk for chlamydia or gonorrhea. Ask your health care provider if you are at risk.  If you do not have HIV, but are at risk, it may be recommended that you take a prescription medicine daily to prevent HIV infection. This is called pre-exposure prophylaxis (PrEP). You are considered at risk if: ? You are sexually active and do not regularly use condoms or know the HIV status of your partner(s). ? You take drugs by injection. ? You are sexually active with a partner who has HIV.  Talk with your health care provider about whether you are at high risk of being infected with HIV. If you choose to begin PrEP, you should first be tested for HIV. You should then be tested every 3 months for as long as you are taking PrEP. Pregnancy  If you are premenopausal and you may become pregnant, ask your health care provider about preconception counseling.  If you may become pregnant, take 400 to 800 micrograms (mcg) of folic acid every day.  If you want to prevent pregnancy, talk to your health care provider about birth control (contraception). Osteoporosis and menopause  Osteoporosis is a disease in which the bones lose minerals and strength with aging. This can result in serious bone fractures. Your risk for osteoporosis can be identified using a bone density scan.  If you are 2 years of age or older, or if you are at risk for osteoporosis and fractures, ask your health care provider if you should be screened.  Ask your health care provider whether you should take a calcium or vitamin D supplement to lower your risk for osteoporosis.  Menopause may have certain physical symptoms and risks.  Hormone replacement therapy may reduce some of these symptoms and risks. Talk to your health care provider about whether hormone replacement therapy is right for  you. Follow these instructions at  home:  Schedule regular health, dental, and eye exams.  Stay current with your immunizations.  Do not use any tobacco products including cigarettes, chewing tobacco, or electronic cigarettes.  If you are pregnant, do not drink alcohol.  If you are breastfeeding, limit how much and how often you drink alcohol.  Limit alcohol intake to no more than 1 drink per day for nonpregnant women. One drink equals 12 ounces of beer, 5 ounces of Margo Lama, or 1 ounces of hard liquor.  Do not use street drugs.  Do not share needles.  Ask your health care provider for help if you need support or information about quitting drugs.  Tell your health care provider if you often feel depressed.  Tell your health care provider if you have ever been abused or do not feel safe at home. This information is not intended to replace advice given to you by your health care provider. Make sure you discuss any questions you have with your health care provider. Document Released: 06/02/2011 Document Revised: 04/24/2016 Document Reviewed: 08/21/2015 Elsevier Interactive Patient Education  [employer].

## 2018-05-19 NOTE — Progress Notes (Addendum)
Subjective:   Virginia [name] is a 77 y.o. female who presents for Medicare Annual (Subsequent) preventive examination.  Review of Systems:  No ROS.  Medicare Wellness Visit. Additional risk factors are reflected in the social history.  Cardiac Risk Factors include: advanced age (>24men, >79 women);dyslipidemia;hypertension Sleep patterns: gets up 1 times nightly to void and sleeps 7 hours nightly.    Home Safety/Smoke Alarms: Feels safe in home. Smoke alarms in place.  Living environment; residence and Firearm Safety: 1-story house/ trailer, no firearms. Lives with husband, no needs for DME, good support system Seat Belt Safety/Bike Helmet: Wears seat belt.     Objective:     Vitals: BP (!) 142/82   Pulse (!) 56   Resp 18   Ht 5\' 4"  (1.626 m)   Wt 177 lb (80.3 kg)   SpO2 97%   BMI 30.38 kg/m   Body mass index is 30.38 kg/m.  Advanced Directives 05/19/2018 05/03/2015  Does Patient Have a Medical Advance Directive? Yes No  Type of Paramedic of [place];Living will -  Would patient like information on creating a medical advance directive? 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 ALLERGIC RHINITIS   . Allergy   . Cataract    beginning stage in lt. eye  . DIVERTICULITIS, HX OF   . FIBROIDS, UTERUS    s/p fibroidectomy  . GERD   . HYPERLIPIDEMIA    intol of statins (myalgia, thumb pain)  . HYPERTENSION   . OSTEOARTHRITIS   . Osteopenia 06/21/2014   DEXA@LB  06/14/14: -1.6  . 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 child birth  73 & 59  . COLONOSCOPY    .  Fibroidectomy    . Low back surgery  01/1994   2 bulging disc  . POLYPECTOMY    . TONSILLECTOMY  1968   Family History  Problem Relation Age of Onset  . Arthritis Mother   . Coronary artery disease Father   . Hyperlipidemia Brother   . Diabetes Maternal Aunt   . Hyperlipidemia Cousin   . Heart disease Cousin   . Diabetes Paternal Aunt   . Colon cancer Neg Hx   . Colon polyps Neg Hx   . Esophageal cancer Neg Hx   . Stomach cancer Neg Hx   . Rectal cancer Neg Hx    Social History   Socioeconomic History  . Marital status: Married    Spouse name: Not on file  . Number of children: 2  . Years of education: Not on file  . Highest education level: Not on file  Occupational History  . Not on file  Social Needs  . Financial resource strain: Not hard at all  . Food insecurity:    Worry: Never true    Inability: Never true  . Transportation needs:    Medical: No    Non-medical: No  Tobacco Use  . Smoking status: Never Smoker  . Smokeless tobacco: Never Used  . 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  Alcohol use: No    Alcohol/week: 0.0 oz  . Drug use: No  . Sexual activity: Not Currently  Lifestyle  . Physical activity:    Days per week: 2 days    Minutes per session: 30 min  . Stress: Rather much  Relationships  . Social connections:    Talks on phone: More than three times a week    Gets together: More than three times a week    Attends religious service: More than 4 times per year    Active member of club or organization: Yes    Attends meetings of clubs or organizations: 1 to 4 times per year    Relationship status: Married  Other Topics Concern  . Not on file  Social History Narrative   Lives with spouse / 2 children;   One in [place] and one is in [place]   Retired Pharmacist, hospital    Outpatient Encounter Medications as of 05/19/2018  Medication Sig  . aspirin 81 MG tablet Take 81 mg by mouth  daily.    . Choline Fenofibrate (FENOFIBRIC ACID) 135 MG CPDR TAKE 1 CAPSULE BY MOUTH EVERY DAY  . loratadine (CLARITIN) 10 MG tablet Take 10 mg by mouth daily.  . metoprolol tartrate (LOPRESSOR) 50 MG tablet TAKE 1 TABLET BY MOUTH TWICE A DAY  . Omega-3 Fatty Acids (FISH OIL) 1000 MG CAPS Take 1,000-2,000 mg by mouth 2 (two) times daily. 2000 mg in the morning and 1000 mg at night   No facility-administered encounter medications on file as of 05/19/2018.     Activities of Daily Living In your present state of health, do you have any difficulty performing the following activities: 05/19/2018  Hearing? N  Vision? N  Difficulty concentrating or making decisions? N  Walking or climbing stairs? N  Dressing or bathing? N  Doing errands, shopping? N  Preparing Food and eating ? N  Using the Toilet? N  In the past six months, have you accidently leaked urine? N  Do you have problems with loss of bowel control? N  Managing your Medications? N  Managing your Finances? N  Housekeeping or managing your Housekeeping? N  Some recent data might be hidden    Patient Care Team: [name], MD as PCP - General (Internal Medicine) Wylene Simmer, MD as Consulting Physician (Orthopedic Surgery)    Assessment:   This is a routine wellness examination for Virginia [name]. Physical assessment deferred to PCP.   Exercise Activities and Dietary recommendations Current Exercise Habits: Home exercise routine, Type of exercise: calisthenics;walking, Time (Minutes): 30, Frequency (Times/Week): 4, Weekly Exercise (Minutes/Week): 120, Intensity: Mild, Exercise limited by: None identified  Diet (meal preparation, eat out, water intake, caffeinated beverages, dairy products, fruits and vegetables): in general, a "healthy" diet  , well balanced   Reviewed heart healthy diet. Encouraged patient to increase daily water and fluid intake. Discussed weight loss strategies, Diet education was provided via  handout.  Goals    . Patient Stated     Continue to travel with my family and my husband as long as my husband can tolerate the trip. 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    . Record weight daily     Agrees to document food intake via [email or web address].[email or web address]/  Controllable CV risk reviewed for goal setting: Includes; family hx; HTN; decreased renal function; obesity; sedentary lifestyle Educated regarding Metabolic syndrome / Factors that increase risk for Heart Disease:  Excess  body fat around the waist Waist circumference women >35 Waist circumference for Men >40  Triglycerides > 150 HDL < 50 BP > 130/85 Glucose > 100 Goals are determined by the physician and based on other relevant data and risk Plan Lifestyle changes  BMI reviewed     Risk Calculator: [email or web address]  BP Education: <120/80; any elevation will warrant further checks  BP can be elevated by lack of quality sleep OTC cold medications  Hormone Therapy post menopause  Eating to much sodium Processed foods (daily intake 2500mg  or one teaspoon of salt) Drinking to much alcohol  Smoking Lack of physical activity  Barriers to successful management: None noted  Stress; (1-5) Risk and reduction techniques reviewed   Fat free or low fat dairy products Fish high in omega-3 acids ( salmon, tuna, trout) Fruits, such as apples, bananas, oranges, pears, prunes Legumes, such as kidney beans, lentils, checkpeas, black-eyed peas and lima beans Vegetables; broccoli, cabbage, carrots Whole grains;   Plant fats; low sugar          Fall Risk Fall Risk  05/19/2018 12/16/2017 11/12/2016 05/03/2015 06/12/2014  Falls in the past year? No No No No No    Depression Screen PHQ 2/9 Scores 05/19/2018 12/16/2017 11/12/2016 05/03/2015  PHQ - 2 Score 1 0 0 0  PHQ- 9 Score 2 - - -     Cognitive Function MMSE - Mini Mental State Exam 05/03/2015  Not completed: Unable to complete        Ad8 score reviewed for issues:  Issues making decisions: no  Less interest in hobbies / activities: no  Repeats questions, stories (family complaining): no  Trouble using ordinary gadgets (microwave, computer, phone):no  Forgets the month or year: no  Mismanaging finances: no  Remembering appts: no  Daily problems with thinking and/or memory: no Ad8 score is= 0  Immunization History  Administered Date(s) Administered  . Influenza Split 10/01/2012  . Influenza Whole 10/01/2009  . Influenza, High Dose Seasonal PF 08/31/2013, 10/17/2015, 11/12/2016, 12/16/2017  . Pneumococcal Conjugate-13 08/16/2015  . Pneumococcal Polysaccharide-23 05/13/2017  . Td 10/01/2006  . Zoster 02/09/2008    Screening Tests Health Maintenance  Topic Date Due  . TETANUS/TDAP  10/01/2016  . COLONOSCOPY  02/14/2018  . DEXA SCAN  05/20/2019 (Originally 06/14/2017)  . INFLUENZA VACCINE  07/01/2018  . PNA vac Low Risk Adult  Completed      Plan:     Patient has colonoscopy scheduled 05/24/18  Continue doing brain stimulating activities (puzzles, reading, adult coloring books, staying active) to keep memory sharp.   Continue to eat heart healthy diet (full of fruits, vegetables, whole grains, lean protein, water--limit salt, fat, and sugar intake) and increase physical activity as tolerated.  I have personally reviewed and noted the following in the patient's chart:   . Medical and social history . Use of alcohol, tobacco or illicit drugs  . Current medications and supplements . Functional ability and status . Nutritional status . Physical activity . Advanced directives . List of other physicians . Vitals . Screenings to include cognitive, depression, and falls . Referrals and appointments  In addition, I have reviewed and discussed with patient certain preventive protocols, quality metrics, and best practice recommendations. A written personalized care plan for preventive services  as well as general preventive health recommendations were provided to patient.     Michiel Cowboy, RN  05/19/2018   Medical screening examination/treatment/procedure(s) were performed by non-physician practitioner and as supervising physician I was immediately  available for consultation/collaboration. I agree with above. [name], MD

## 2018-05-24 ENCOUNTER — Other Ambulatory Visit: Payer: Self-pay

## 2018-05-24 ENCOUNTER — Ambulatory Visit (AMBULATORY_SURGERY_CENTER): Payer: Medicare Other | Admitting: Internal Medicine

## 2018-05-24 ENCOUNTER — Encounter: Payer: Self-pay | Admitting: Internal Medicine

## 2018-05-24 VITALS — BP 137/65 | HR 53 | Temp 96.8°F | Resp 12 | Ht 64.0 in | Wt 177.0 lb

## 2018-05-24 DIAGNOSIS — D124 Benign neoplasm of descending colon: Secondary | ICD-10-CM | POA: Diagnosis not present

## 2018-05-24 DIAGNOSIS — D123 Benign neoplasm of transverse colon: Secondary | ICD-10-CM

## 2018-05-24 DIAGNOSIS — Z86010 Personal history of colonic polyps: Secondary | ICD-10-CM | POA: Diagnosis not present

## 2018-05-24 DIAGNOSIS — D120 Benign neoplasm of cecum: Secondary | ICD-10-CM | POA: Diagnosis not present

## 2018-05-24 MED ORDER — SODIUM CHLORIDE 0.9 % IV SOLN: 500.0000 mL | Freq: Once | INTRAVENOUS | Status: DC

## 2018-05-24 NOTE — Patient Instructions (Signed)
  Please read handout on polyps, diverticulosis, hemorrhoids.   YOU HAD AN ENDOSCOPIC PROCEDURE TODAY AT [facility]:   Refer to the procedure report that was given to you for any specific questions about what was found during the examination.  If the procedure report does not answer your questions, please call your gastroenterologist to clarify.  If you requested that your care partner not be given the details of your procedure findings, then the procedure report has been included in a sealed envelope for you to review at your convenience later.  YOU SHOULD EXPECT: Some feelings of bloating in the abdomen. Passage of more gas than usual.  Walking can help get rid of the air that was put into your GI tract during the procedure and reduce the bloating. If you had a lower endoscopy (such as a colonoscopy or flexible sigmoidoscopy) you may notice spotting of blood in your stool or on the toilet paper. If you underwent a bowel prep for your procedure, you may not have a normal bowel movement for a few days.  Please Note:  You might notice some irritation and congestion in your nose or some drainage.  This is from the oxygen used during your procedure.  There is no need for concern and it should clear up in a day or so.  SYMPTOMS TO REPORT IMMEDIATELY:   Following lower endoscopy (colonoscopy or flexible sigmoidoscopy):  Excessive amounts of blood in the stool  Significant tenderness or worsening of abdominal pains  Swelling of the abdomen that is new, acute  Fever of 100F or higher    For urgent or emergent issues, a gastroenterologist can be reached at any hour by calling [phone].   DIET:  We do recommend a small meal at first, but then you may proceed to your regular diet.  Drink plenty of fluids but you should avoid alcoholic beverages for 24 hours.  ACTIVITY:  You should plan to take it easy for the rest of today and you should NOT DRIVE or use heavy machinery until  tomorrow (because of the sedation medicines used during the test).    FOLLOW UP: Our staff will call the number listed on your records the next business day following your procedure to check on you and address any questions or concerns that you may have regarding the information given to you following your procedure. If we do not reach you, we will leave a message.  However, if you are feeling well and you are not experiencing any problems, there is no need to return our call.  We will assume that you have returned to your regular daily activities without incident.  If any biopsies were taken you will be contacted by phone or by letter within the next 1-3 weeks.  Please call us at [phone] if you have not heard about the biopsies in 3 weeks.    SIGNATURES/CONFIDENTIALITY: You and/or your care partner have signed paperwork which will be entered into your electronic medical record.  These signatures attest to the fact that that the information above on your After Visit Summary has been reviewed and is understood.  Full responsibility of the confidentiality of this discharge information lies with you and/or your care-partner.

## 2018-05-24 NOTE — Progress Notes (Signed)
A/ox3 pleased with MAC, report to RN 

## 2018-05-24 NOTE — Op Note (Signed)
[facility] Patient Name: Virginia [name] Procedure Date: 05/24/2018 11:12 AM MRN: [id number] Endoscopist: Jerene Bears , MD Age: 77 Referring MD:  Date of Birth: May 07, 1941 Gender: Female Account #: [id number] Procedure:                Colonoscopy Indications:              High risk colon cancer surveillance: Personal                            history of adenoma with villous component, Last                            colonoscopy 3 years ago Medicines:                Monitored Anesthesia Care Procedure:                Pre-Anesthesia Assessment:                           - Prior to the procedure, a History and Physical                            was performed, and patient medications and                            allergies were reviewed. The patient's tolerance of                            previous anesthesia was also reviewed. The risks                            and benefits of the procedure and the sedation                            options and risks were discussed with the patient.                            All questions were answered, and informed consent                            was obtained. Prior Anticoagulants: The patient has                            taken no previous anticoagulant or antiplatelet                            agents. ASA Grade Assessment: II - A patient with                            mild systemic disease. After reviewing the risks                            and benefits, the patient was deemed in  satisfactory condition to undergo the procedure.                           After obtaining informed consent, the colonoscope                            was passed under direct vision. Throughout the                            procedure, the patient's blood pressure, pulse, and                            oxygen saturations were monitored continuously. The                            Model PCF-H190DL [id number]) scope was  introduced                            through the anus and advanced to the cecum,                            identified by appendiceal orifice and ileocecal                            valve. The colonoscopy was performed without                            difficulty. The patient tolerated the procedure                            well. The quality of the bowel preparation was                            good. The ileocecal valve, appendiceal orifice, and                            rectum were photographed. Scope In: 11:19:05 AM Scope Out: 11:39:52 AM Scope Withdrawal Time: 0 hours 13 minutes 6 seconds  Total Procedure Duration: 0 hours 20 minutes 47 seconds  Findings:                 The digital rectal exam was normal.                           A 1 mm polyp was found in the ileocecal valve. The                            polyp was sessile. The polyp was removed with a                            cold biopsy forceps. Resection and retrieval were                            complete.  A 4 mm polyp was found in the transverse colon. The                            polyp was sessile. The polyp was removed with a                            cold snare. Resection and retrieval were complete.                           A 5 mm polyp was found in the descending colon. The                            polyp was sessile. The polyp was removed with a                            cold snare. Resection and retrieval were complete.                           Multiple small and large-mouthed diverticula were                            found in the sigmoid colon.                           Internal hemorrhoids were found during                            retroflexion. The hemorrhoids were small. Complications:            No immediate complications. Estimated Blood Loss:     Estimated blood loss was minimal. Impression:               - One 1 mm polyp at the ileocecal valve, removed                             with a cold biopsy forceps. Resected and retrieved.                           - One 4 mm polyp in the transverse colon, removed                            with a cold snare. Resected and retrieved.                           - One 5 mm polyp in the descending colon, removed                            with a cold snare. Resected and retrieved.                           - Diverticulosis in the sigmoid colon.                           -  Small internal hemorrhoids. Recommendation:           - Patient has a contact number available for                            emergencies. The signs and symptoms of potential                            delayed complications were discussed with the                            patient. Return to normal activities tomorrow.                            Written discharge instructions were provided to the                            patient.                           - Resume previous diet.                           - Continue present medications.                           - Await pathology results.                           - Repeat colonoscopy is recommended for                            surveillance. The colonoscopy date will be                            determined after pathology results from today's                            exam become available for review. Jerene Bears, MD 05/24/2018 11:43:10 AM This report has been signed electronically.

## 2018-05-24 NOTE — Progress Notes (Signed)
Called to room to assist during endoscopic procedure.  Patient ID and intended procedure confirmed with present staff. Received instructions for my participation in the procedure from the performing physician.  

## 2018-05-25 ENCOUNTER — Telehealth: Payer: Self-pay | Admitting: *Deleted

## 2018-05-25 NOTE — Telephone Encounter (Signed)
  Follow up Call-  Call back number 05/24/2018  Post procedure Call Back phone  # [phone]  Permission to leave phone message Yes  Some recent data might be hidden     Patient questions:  Do you have a fever, pain , or abdominal swelling? No. Pain Score  0 *  Have you tolerated food without any problems? Yes.    Have you been able to return to your normal activities? Yes.    Do you have any questions about your discharge instructions: Diet   No. Medications  No. Follow up visit  No.  Do you have questions or concerns about your Care? No.  Actions: * If pain score is 4 or above: No action needed, pain <4.

## 2018-05-31 ENCOUNTER — Encounter: Payer: Self-pay | Admitting: Internal Medicine

## 2018-06-16 ENCOUNTER — Encounter: Payer: Medicare Other | Admitting: Internal Medicine

## 2018-06-16 ENCOUNTER — Ambulatory Visit: Payer: Medicare Other | Admitting: Internal Medicine

## 2018-07-20 NOTE — Patient Instructions (Addendum)
Test(s) ordered today. Your results will be released to [facility] (or called to you) after review, usually within 72hours after test completion. If any changes need to be made, you will be notified at that same time.  All other Health Maintenance issues reviewed.   All recommended immunizations and age-appropriate screenings are up-to-date or discussed.  No immunizations administered today.   Medications reviewed and updated.  No changes recommended at this time.   Please followup in 6 months   Health Maintenance, Female Adopting a healthy lifestyle and getting preventive care can go a long way to promote health and wellness. Talk with your health care provider about what schedule of regular examinations is right for you. This is a good chance for you to check in with your provider about disease prevention and staying healthy. In between checkups, there are plenty of things you can do on your own. Experts have done a lot of research about which lifestyle changes and preventive measures are most likely to keep you healthy. Ask your health care provider for more information. Weight and diet Eat a healthy diet  Be sure to include plenty of vegetables, fruits, low-fat dairy products, and lean protein.  Do not eat a lot of foods high in solid fats, added sugars, or salt.  Get regular exercise. This is one of the most important things you can do for your health. ? Most adults should exercise for at least 150 minutes each week. The exercise should increase your heart rate and make you sweat (moderate-intensity exercise). ? Most adults should also do strengthening exercises at least twice a week. This is in addition to the moderate-intensity exercise.  Maintain a healthy weight  Body mass index (BMI) is a measurement that can be used to identify possible weight problems. It estimates body fat based on height and weight. Your health care provider can help determine your BMI and help you achieve or  maintain a healthy weight.  For females 40 years of age and older: ? A BMI below 18.5 is considered underweight. ? A BMI of 18.5 to 24.9 is normal. ? A BMI of 25 to 29.9 is considered overweight. ? A BMI of 30 and above is considered obese.  Watch levels of cholesterol and blood lipids  You should start having your blood tested for lipids and cholesterol at 75 years of age, then have this test every 5 years.  You may need to have your cholesterol levels checked more often if: ? Your lipid or cholesterol levels are high. ? You are older than 75 years of age. ? You are at high risk for heart disease.  Cancer screening Lung Cancer  Lung cancer screening is recommended for adults 59-64 years old who are at high risk for lung cancer because of a history of smoking.  A yearly low-dose CT scan of the lungs is recommended for people who: ? Currently smoke. ? Have quit within the past 15 years. ? Have at least a 30-pack-year history of smoking. A pack year is smoking an average of one pack of cigarettes a day for 1 year.  Yearly screening should continue until it has been 15 years since you quit.  Yearly screening should stop if you develop a health problem that would prevent you from having lung cancer treatment.  Breast Cancer  Practice breast self-awareness. This means understanding how your breasts normally appear and feel.  It also means doing regular breast self-exams. Let your health care provider know about any changes,  no matter how small.  If you are in your 20s or 30s, you should have a clinical breast exam (CBE) by a health care provider every 1-3 years as part of a regular health exam.  If you are 40 or older, have a CBE every year. Also consider having a breast X-ray (mammogram) every year.  If you have a family history of breast cancer, talk to your health care provider about genetic screening.  If you are at high risk for breast cancer, talk to your health care  provider about having an MRI and a mammogram every year.  Breast cancer gene (BRCA) assessment is recommended for women who have family members with BRCA-related cancers. BRCA-related cancers include: ? Breast. ? Ovarian. ? Tubal. ? Peritoneal cancers.  Results of the assessment will determine the need for genetic counseling and BRCA1 and BRCA2 testing.  Cervical Cancer Your health care provider may recommend that you be screened regularly for cancer of the pelvic organs (ovaries, uterus, and vagina). This screening involves a pelvic examination, including checking for microscopic changes to the surface of your cervix (Pap test). You may be encouraged to have this screening done every 3 years, beginning at age 21.  For women ages 30-65, health care providers may recommend pelvic exams and Pap testing every 3 years, or they may recommend the Pap and pelvic exam, combined with testing for human papilloma virus (HPV), every 5 years. Some types of HPV increase your risk of cervical cancer. Testing for HPV may also be done on women of any age with unclear Pap test results.  Other health care providers may not recommend any screening for nonpregnant women who are considered low risk for pelvic cancer and who do not have symptoms. Ask your health care provider if a screening pelvic exam is right for you.  If you have had past treatment for cervical cancer or a condition that could lead to cancer, you need Pap tests and screening for cancer for at least 20 years after your treatment. If Pap tests have been discontinued, your risk factors (such as having a new sexual partner) need to be reassessed to determine if screening should resume. Some women have medical problems that increase the chance of getting cervical cancer. In these cases, your health care provider may recommend more frequent screening and Pap tests.  Colorectal Cancer  This type of cancer can be detected and often prevented.  Routine  colorectal cancer screening usually begins at 75 years of age and continues through 75 years of age.  Your health care provider may recommend screening at an earlier age if you have risk factors for colon cancer.  Your health care provider may also recommend using home test kits to check for hidden blood in the stool.  A small camera at the end of a tube can be used to examine your colon directly (sigmoidoscopy or colonoscopy). This is done to check for the earliest forms of colorectal cancer.  Routine screening usually begins at age 50.  Direct examination of the colon should be repeated every 5-10 years through 75 years of age. However, you may need to be screened more often if early forms of precancerous polyps or small growths are found.  Skin Cancer  Check your skin from head to toe regularly.  Tell your health care provider about any new moles or changes in moles, especially if there is a change in a mole's shape or color.  Also tell your health care provider if you   have a mole that is larger than the size of a pencil eraser.  Always use sunscreen. Apply sunscreen liberally and repeatedly throughout the day.  Protect yourself by wearing long sleeves, pants, a wide-brimmed hat, and sunglasses whenever you are outside.  Heart disease, diabetes, and high blood pressure  High blood pressure causes heart disease and increases the risk of stroke. High blood pressure is more likely to develop in: ? People who have blood pressure in the high end of the normal range (130-139/85-89 mm Hg). ? People who are overweight or obese. ? People who are African American.  If you are 24-25 years of age, have your blood pressure checked every 3-5 years. If you are 2 years of age or older, have your blood pressure checked every year. You should have your blood pressure measured twice-once when you are at a hospital or clinic, and once when you are not at a hospital or clinic. Record the average of the  two measurements. To check your blood pressure when you are not at a hospital or clinic, you can use: ? An automated blood pressure machine at a pharmacy. ? A home blood pressure monitor.  If you are between 42 years and 59 years old, ask your health care provider if you should take aspirin to prevent strokes.  Have regular diabetes screenings. This involves taking a blood sample to check your fasting blood sugar level. ? If you are at a normal weight and have a low risk for diabetes, have this test once every three years after 75 years of age. ? If you are overweight and have a high risk for diabetes, consider being tested at a younger age or more often. Preventing infection Hepatitis B  If you have a higher risk for hepatitis B, you should be screened for this virus. You are considered at high risk for hepatitis B if: ? You were born in a country where hepatitis B is common. Ask your health care provider which countries are considered high risk. ? Your parents were born in a high-risk country, and you have not been immunized against hepatitis B (hepatitis B vaccine). ? You have HIV or AIDS. ? You use needles to inject street drugs. ? You live with someone who has hepatitis B. ? You have had sex with someone who has hepatitis B. ? You get hemodialysis treatment. ? You take certain medicines for conditions, including cancer, organ transplantation, and autoimmune conditions.  Hepatitis C  Blood testing is recommended for: ? Everyone born from [birth year] through 1963. ? Anyone with known risk factors for hepatitis C.  Sexually transmitted infections (STIs)  You should be screened for sexually transmitted infections (STIs) including gonorrhea and chlamydia if: ? You are sexually active and are younger than 75 years of age. ? You are older than 75 years of age and your health care provider tells you that you are at risk for this type of infection. ? Your sexual activity has changed since you  were last screened and you are at an increased risk for chlamydia or gonorrhea. Ask your health care provider if you are at risk.  If you do not have HIV, but are at risk, it may be recommended that you take a prescription medicine daily to prevent HIV infection. This is called pre-exposure prophylaxis (PrEP). You are considered at risk if: ? You are sexually active and do not regularly use condoms or know the HIV status of your partner(s). ? You take drugs by injection. ?  You are sexually active with a partner who has HIV.  Talk with your health care provider about whether you are at high risk of being infected with HIV. If you choose to begin PrEP, you should first be tested for HIV. You should then be tested every 3 months for as long as you are taking PrEP. Pregnancy  If you are premenopausal and you may become pregnant, ask your health care provider about preconception counseling.  If you may become pregnant, take 400 to 800 micrograms (mcg) of folic acid every day.  If you want to prevent pregnancy, talk to your health care provider about birth control (contraception). Osteoporosis and menopause  Osteoporosis is a disease in which the bones lose minerals and strength with aging. This can result in serious bone fractures. Your risk for osteoporosis can be identified using a bone density scan.  If you are 65 years of age or older, or if you are at risk for osteoporosis and fractures, ask your health care provider if you should be screened.  Ask your health care provider whether you should take a calcium or vitamin D supplement to lower your risk for osteoporosis.  Menopause may have certain physical symptoms and risks.  Hormone replacement therapy may reduce some of these symptoms and risks. Talk to your health care provider about whether hormone replacement therapy is right for you. Follow these instructions at home:  Schedule regular health, dental, and eye exams.  Stay current  with your immunizations.  Do not use any tobacco products including cigarettes, chewing tobacco, or electronic cigarettes.  If you are pregnant, do not drink alcohol.  If you are breastfeeding, limit how much and how often you drink alcohol.  Limit alcohol intake to no more than 1 drink per day for nonpregnant women. One drink equals 12 ounces of beer, 5 ounces of wine, or 1 ounces of hard liquor.  Do not use street drugs.  Do not share needles.  Ask your health care provider for help if you need support or information about quitting drugs.  Tell your health care provider if you often feel depressed.  Tell your health care provider if you have ever been abused or do not feel safe at home. This information is not intended to replace advice given to you by your health care provider. Make sure you discuss any questions you have with your health care provider. Document Released: 06/02/2011 Document Revised: 04/24/2016 Document Reviewed: 08/21/2015 Elsevier Interactive Patient Education  2018 [employer] Inc[employer]  

## 2018-07-20 NOTE — Progress Notes (Signed)
Subjective:    Patient ID: Virginia [name], female    DOB: 1941/04/23, 77 y.o.   MRN: [id number]  HPI She is here for a physical exam.   Left shoulder pain:  It started over a month ago after massage her husband's back.  She had a chiropractor look at it and it is a little better since then.  It hurts when she raises the arm. She has normal ROM.     Trigger finger of left ring finger.  Her chiropractor worked on that as well.  It is flexed when she wakes up and painful.     Medications and allergies reviewed with patient and updated if appropriate.  Patient Active Problem List   Diagnosis Date Noted  . Prediabetes 04/19/2016  . Constipation 04/16/2016  . Bilateral thumb pain 04/16/2016  . Dysphagia 10/17/2015  . Chronic kidney disease 08/13/2015  . Osteopenia 06/21/2014  . Back pain   . Sciatica of right side 05/12/2011  . 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 OSTEOARTHRITIS 01/01/2010  . FIBROIDS, UTERUS 12/13/2009  . Hyperlipidemia 12/13/2009  . Essential hypertension 12/13/2009  . ALLERGIC RHINITIS 12/13/2009  . COLONIC POLYPS, HX OF 12/13/2009    Current Outpatient Medications on File Prior to Visit  Medication Sig Dispense Refill  . aspirin 81 MG tablet Take 81 mg by mouth daily.      . Choline Fenofibrate (FENOFIBRIC ACID) 135 MG CPDR TAKE 1 CAPSULE BY MOUTH EVERY DAY 90 capsule 1  . loratadine (CLARITIN) 10 MG tablet Take 10 mg by mouth daily.    . metoprolol tartrate (LOPRESSOR) 50 MG tablet TAKE 1 TABLET BY MOUTH TWICE A DAY 180 tablet 1  . Omega-3 Fatty Acids (FISH OIL) 1000 MG CAPS Take 1,000-2,000 mg by mouth 2 (two) times daily. 2000 mg in the morning and 1000 mg at night     No current facility-administered medications on file prior to visit.     Past Medical History:  Diagnosis Date  . ALLERGIC RHINITIS   . Allergy   . Cataract    beginning stage in lt. eye  . DIVERTICULITIS, HX OF   . FIBROIDS, UTERUS    s/p fibroidectomy  . GERD   . HYPERLIPIDEMIA     intol of statins (myalgia, thumb pain)  . HYPERTENSION   . OSTEOARTHRITIS   . Osteopenia 06/21/2014   DEXA@LB  06/14/14: -1.6  . 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 child birth  55 & 23  . COLONOSCOPY    . Fibroidectomy    . Low back surgery  01/1994   2 bulging disc  . POLYPECTOMY    . TONSILLECTOMY  1968    Social History   Socioeconomic History  . Marital status: Married    Spouse name: Not on file  . Number of children: 2  . Years of education: Not on file  . Highest education level: Not on file  Occupational History  . Not on file  Social Needs  . Financial resource strain: Not hard at all  . Food insecurity:    Worry: Never true    Inability: Never true  . Transportation needs:    Medical: No    Non-medical: No  Tobacco Use  . Smoking status: Never Smoker  . Smokeless tobacco: Never Used  . 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 Alcohol use: No  Alcohol/week: 0.0 standard drinks  . Drug use: No  . Sexual activity: Not Currently  Lifestyle  . Physical activity:    Days per week: 2 days    Minutes per session: 30 min  . Stress: Rather much  Relationships  . Social connections:    Talks on phone: More than three times a week    Gets together: More than three times a week    Attends religious service: More than 4 times per year    Active member of club or organization: Yes    Attends meetings of clubs or organizations: 1 to 4 times per year    Relationship status: Married  Other Topics Concern  . Not on file  Social History Narrative   Lives with spouse / 2 children;   One in [place] and one is in [place]   Retired Pharmacist, hospital    Family History  Problem Relation Age of Onset  . Arthritis Mother   . Coronary artery disease Father   . Hyperlipidemia Brother   . Diabetes Maternal Aunt   . Hyperlipidemia Cousin   .  Heart disease Cousin   . Diabetes Paternal Aunt   . Colon cancer Neg Hx   . Colon polyps Neg Hx   . Esophageal cancer Neg Hx   . Stomach cancer Neg Hx   . Rectal cancer Neg Hx     Review of Systems  Constitutional: Negative for chills and fever.  Eyes: Negative for visual disturbance.  Respiratory: Positive for cough (allergy related). Negative for shortness of breath and wheezing.   Cardiovascular: Positive for palpitations (occ, transient). Negative for chest pain and leg swelling (mild foot puffiness).  Gastrointestinal: Negative for abdominal pain, anal bleeding, constipation, diarrhea and nausea.       Rare gerd  Genitourinary: Negative for dysuria and hematuria.  Musculoskeletal: Positive for arthralgias (left shoulder, thumbs).  Skin: Negative for color change and rash.  Neurological: Negative for dizziness, light-headedness and headaches.  Psychiatric/Behavioral: Positive for dysphoric mood. The patient is nervous/anxious.        Objective:   Vitals:   07/21/18 1057  BP: 140/66  Pulse: 62  Resp: 16  Temp: 98.3 F (36.8 C)  SpO2: 98%   Filed Weights   07/21/18 1057  Weight: 179 lb (81.2 kg)   Body mass index is 30.73 kg/m.  BP Readings from Last 3 Encounters:  07/21/18 140/66  05/24/18 137/65  05/19/18 (!) 142/82    Wt Readings from Last 3 Encounters:  07/21/18 179 lb (81.2 kg)  05/24/18 177 lb (80.3 kg)  05/19/18 177 lb (80.3 kg)     Physical Exam Constitutional: She appears well-developed and well-nourished. No distress.  HENT:  Head: Normocephalic and atraumatic.  Right Ear: External ear normal. Normal ear canal with moderate cerumen, TM not able to be visualized Left Ear: External ear normal.  Normal ear canal with moderate cerumen, TM not able to be visualized Mouth/Throat: Oropharynx is clear and moist.  Eyes: Conjunctivae and EOM are normal.  Neck: Neck supple. No tracheal deviation present. No thyromegaly present.  No carotid bruit    Cardiovascular: Normal rate, regular rhythm and normal heart sounds.   No murmur heard.  No edema. Pulmonary/Chest: Effort normal and breath sounds normal. No respiratory distress. She has no wheezes. She has no rales.  Breast: deferred  Abdominal: Soft. She exhibits no distension. There is no tenderness.  Lymphadenopathy: She has no cervical adenopathy.  Skin: Skin is warm and dry. She  is not diaphoretic.  Psychiatric: She has a normal mood and affect. Her behavior is normal.        Assessment & Plan:   Physical exam: Screening blood work  ordered Immunizations   Had shingrix,  Td deferred, flu this fall Colonoscopy    Up to date - due 2022 Mammogram  No longer needed due to age 69  No longer sees Dexa   Not up to date  - deferred Eye exams  Up to date  EKG   Last done 2009 Exercise   Active at house - no regular exercise Weight  Encouraged weight loss Skin      No concerns Substance abuse      none  See Problem List for Assessment and Plan of chronic medical problems.

## 2018-07-21 ENCOUNTER — Ambulatory Visit (INDEPENDENT_AMBULATORY_CARE_PROVIDER_SITE_OTHER): Payer: Medicare Other | Admitting: Internal Medicine

## 2018-07-21 ENCOUNTER — Encounter: Payer: Self-pay | Admitting: Internal Medicine

## 2018-07-21 VITALS — BP 140/66 | HR 62 | Temp 98.3°F | Resp 16 | Ht 64.0 in | Wt 179.0 lb

## 2018-07-21 DIAGNOSIS — Z636 Dependent relative needing care at home: Secondary | ICD-10-CM | POA: Insufficient documentation

## 2018-07-21 DIAGNOSIS — I10 Essential (primary) hypertension: Secondary | ICD-10-CM

## 2018-07-21 DIAGNOSIS — Z0000 Encounter for general adult medical examination without abnormal findings: Secondary | ICD-10-CM | POA: Diagnosis not present

## 2018-07-21 DIAGNOSIS — M8580 Other specified disorders of bone density and structure, unspecified site: Secondary | ICD-10-CM

## 2018-07-21 DIAGNOSIS — N182 Chronic kidney disease, stage 2 (mild): Secondary | ICD-10-CM

## 2018-07-21 DIAGNOSIS — E7849 Other hyperlipidemia: Secondary | ICD-10-CM

## 2018-07-21 DIAGNOSIS — R7303 Prediabetes: Secondary | ICD-10-CM

## 2018-07-21 NOTE — Assessment & Plan Note (Signed)
Check a1c Low sugar / carb diet Stressed regular exercise, weight loss  

## 2018-07-21 NOTE — Assessment & Plan Note (Signed)
Will start caltrate No regular exercise - advised regular exercise dexa deferred - she does not want to take medication

## 2018-07-21 NOTE — Assessment & Plan Note (Signed)
Check lipid panel  Continue daily fenofibrate Regular exercise and healthy diet encouraged  

## 2018-07-21 NOTE — Assessment & Plan Note (Signed)
BP well controlled Current regimen effective and well tolerated Continue current medications at current doses cmp  

## 2018-07-21 NOTE — Assessment & Plan Note (Signed)
Mild, stage 2 - possible partially related to aging Discussed avoiding nsaids, good water intake Keeping BP, sugar controlled Advised weight loss

## 2018-07-21 NOTE — Assessment & Plan Note (Signed)
Her husband has dementia and she has caregiver stress She does take him to daycare She sees a therapist and goes to a support group Deferred medication

## 2018-07-27 ENCOUNTER — Other Ambulatory Visit: Payer: Self-pay | Admitting: Internal Medicine

## 2018-10-01 ENCOUNTER — Other Ambulatory Visit (INDEPENDENT_AMBULATORY_CARE_PROVIDER_SITE_OTHER): Payer: Medicare Other

## 2018-10-01 DIAGNOSIS — R7303 Prediabetes: Secondary | ICD-10-CM | POA: Diagnosis not present

## 2018-10-01 DIAGNOSIS — M8580 Other specified disorders of bone density and structure, unspecified site: Secondary | ICD-10-CM

## 2018-10-01 DIAGNOSIS — I10 Essential (primary) hypertension: Secondary | ICD-10-CM | POA: Diagnosis not present

## 2018-10-01 DIAGNOSIS — N182 Chronic kidney disease, stage 2 (mild): Secondary | ICD-10-CM

## 2018-10-01 DIAGNOSIS — E7849 Other hyperlipidemia: Secondary | ICD-10-CM

## 2018-10-01 LAB — LIPID PANEL
CHOLESTEROL: 199 mg/dL (ref 0–200)
HDL: 45.8 mg/dL (ref >39.00)
LDL CALC: 121 mg/dL — AB (ref 0–99)
NonHDL: 153.61
TRIGLYCERIDES: 165 mg/dL — AB (ref 0.0–149.0)
Total CHOL/HDL Ratio: 4
VLDL: 33 mg/dL (ref 0.0–40.0)

## 2018-10-01 LAB — COMPREHENSIVE METABOLIC PANEL
ALBUMIN: 4.1 g/dL (ref 3.5–5.2)
ALT: 13 U/L (ref 0–35)
AST: 13 U/L (ref 0–37)
Alkaline Phosphatase: 41 U/L (ref 39–117)
BUN: 24 mg/dL — ABNORMAL HIGH (ref 6–23)
CALCIUM: 10.2 mg/dL (ref 8.4–10.5)
CHLORIDE: 108 meq/L (ref 96–112)
CO2: 28 mEq/L (ref 19–32)
Creatinine, Ser: 1.02 mg/dL (ref 0.40–1.20)
GFR: 56.04 mL/min — AB (ref >60.00)
Glucose, Bld: 113 mg/dL — ABNORMAL HIGH (ref 70–99)
Potassium: 4.6 mEq/L (ref 3.5–5.1)
Sodium: 144 mEq/L (ref 135–145)
Total Bilirubin: 0.6 mg/dL (ref 0.2–1.2)
Total Protein: 6.8 g/dL (ref 6.0–8.3)

## 2018-10-01 LAB — CBC WITH DIFFERENTIAL/PLATELET
BASOS ABS: 0.1 10*3/uL (ref 0.0–0.1)
BASOS PCT: 0.9 % (ref 0.0–3.0)
EOS ABS: 0.3 10*3/uL (ref 0.0–0.7)
Eosinophils Relative: 4.7 % (ref 0.0–5.0)
HEMATOCRIT: 38.1 % (ref 36.0–46.0)
HEMOGLOBIN: 12.7 g/dL (ref 12.0–15.0)
LYMPHS PCT: 41.5 % (ref 12.0–46.0)
Lymphs Abs: 3 10*3/uL (ref 0.7–4.0)
MCHC: 33.3 g/dL (ref 30.0–36.0)
MCV: 83.3 fl (ref 78.0–100.0)
MONOS PCT: 7.1 % (ref 3.0–12.0)
Monocytes Absolute: 0.5 10*3/uL (ref 0.1–1.0)
Neutro Abs: 3.3 10*3/uL (ref 1.4–7.7)
Neutrophils Relative %: 45.8 % (ref 43.0–77.0)
Platelets: 245 10*3/uL (ref 150.0–400.0)
RBC: 4.57 Mil/uL (ref 3.87–5.11)
RDW: 13.7 % (ref 11.5–15.5)
WBC: 7.1 10*3/uL (ref 4.0–10.5)

## 2018-10-01 LAB — TSH: TSH: 2.87 u[IU]/mL (ref 0.35–4.50)

## 2018-10-01 LAB — HEMOGLOBIN A1C: Hgb A1c MFr Bld: 6 % (ref 4.6–6.5)

## 2018-10-03 ENCOUNTER — Encounter: Payer: Self-pay | Admitting: Internal Medicine

## 2018-10-22 ENCOUNTER — Ambulatory Visit (INDEPENDENT_AMBULATORY_CARE_PROVIDER_SITE_OTHER)
Admission: RE | Admit: 2018-10-22 | Discharge: 2018-10-22 | Disposition: A | Discharge status: Home / Self Care | Payer: Medicare Other | Source: Ambulatory Visit | Attending: Family Medicine | Admitting: Family Medicine

## 2018-10-22 ENCOUNTER — Encounter: Payer: Self-pay | Admitting: Family Medicine

## 2018-10-22 ENCOUNTER — Ambulatory Visit: Payer: Medicare Other | Admitting: Family Medicine

## 2018-10-22 VITALS — BP 130/70 | HR 78 | Resp 16 | Wt 177.0 lb

## 2018-10-22 DIAGNOSIS — M79672 Pain in left foot: Secondary | ICD-10-CM | POA: Diagnosis not present

## 2018-10-22 NOTE — Patient Instructions (Signed)
Nice to meet you  I will call you with the results from today  Please try ice on the foot  Please see me to have a pair of custom orthotics made  Please see me back in 2-3 weeks if your pain isn't improved

## 2018-10-22 NOTE — Progress Notes (Signed)
Virginia [name] - 77 y.o. female MRN [id number]  Date of birth: 12/31/1940  SUBJECTIVE:  Including CC & ROS.  No chief complaint on file.   Virginia [name] is a 77 y.o. female that is  Presenting with bruising on the dorsal aspect of her left foot:  She denies any injury.  She wore new shoes and noticed the bruising afterwards.  She also notices a small raised bump on the dorsal aspect of the foot overlying the third metatarsal.  Has mild pain is localized to the midfoot.  Has some mild swelling as well.  Bruising has been improving.     Review of Systems  Constitutional: Negative for fever.  HENT: Negative for congestion.   Respiratory: Negative for cough.   Cardiovascular: Negative for chest pain.  Gastrointestinal: Negative for abdominal pain.  Musculoskeletal: Negative for gait problem.  Skin: Positive for color change.  Neurological: Negative for weakness.  Hematological: Bruises/bleeds easily.  Psychiatric/Behavioral: Negative for agitation.    HISTORY: Past Medical, Surgical, Social, and Family History Reviewed & Updated per EMR.   Pertinent Historical Findings include:  Past Medical History:  Diagnosis Date  . ALLERGIC RHINITIS   . Allergy   . Cataract    beginning stage in lt. eye  . DIVERTICULITIS, HX OF   . FIBROIDS, UTERUS    s/p fibroidectomy  . GERD   . HYPERLIPIDEMIA    intol of statins (myalgia, thumb pain)  . HYPERTENSION   . OSTEOARTHRITIS   . Osteopenia 06/21/2014   DEXA@LB  06/14/14: -1.6  . 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 child birth  80 & 74  . COLONOSCOPY    . Fibroidectomy    . Low back surgery  01/1994   2 bulging disc  . POLYPECTOMY    . TONSILLECTOMY  1968    Allergies  Allergen Reactions  . Penicillins Hives  . Statins Other (See Comments)    pain in base of thumbs    Family History  Problem Relation Age of Onset  . Arthritis Mother   . Coronary  artery disease Father   . Hyperlipidemia Brother   . Diabetes Maternal Aunt   . Hyperlipidemia Cousin   . Heart disease Cousin   . Diabetes Paternal Aunt   . Colon cancer Neg Hx   . Colon polyps Neg Hx   . Esophageal cancer Neg Hx   . Stomach cancer Neg Hx   . Rectal cancer Neg Hx      Social History   Socioeconomic History  . Marital status: Married    Spouse name: Not on file  . Number of children: 2  . Years of education: Not on file  . Highest education level: Not on file  Occupational History  . Not on file  Social Needs  . Financial resource strain: Not hard at all  . Food insecurity:    Worry: Never true    Inability: Never true  . Transportation needs:    Medical: No    Non-medical: No  Tobacco Use  . Smoking status: Never Smoker  . Smokeless tobacco: Never Used  . 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 Alcohol use: No    Alcohol/week: 0.0 standard drinks  . Drug use: No  . Sexual activity: Not Currently  Lifestyle  . Physical activity:    Days per week: 2 days  Minutes per session: 30 min  . Stress: Rather much  Relationships  . Social connections:    Talks on phone: More than three times a week    Gets together: More than three times a week    Attends religious service: More than 4 times per year    Active member of club or organization: Yes    Attends meetings of clubs or organizations: 1 to 4 times per year    Relationship status: Married  . Intimate partner violence:    Fear of current or ex partner: No    Emotionally abused: No    Physically abused: No    Forced sexual activity: No  Other Topics Concern  . Not on file  Social History Narrative   Lives with spouse / 2 children;   One in [place] and one is in [place]   Retired Pharmacist, hospital     PHYSICAL EXAM:  VS: BP 130/70   Pulse 78   Resp 16   Wt 177 lb (80.3 kg)   SpO2 98%   BMI 30.38 kg/m  Physical Exam Gen: NAD,  alert, cooperative with exam, well-appearing ENT: normal lips, normal nasal mucosa,  Eye: normal EOM, normal conjunctiva and lids CV:  no edema, +2 pedal pulses   Resp: no accessory muscle use, non-labored,  Skin: no rashes, no areas of induration  Neuro: normal tone, normal sensation to touch Psych:  normal insight, alert and oriented MSK:  Left foot: Ecchymosis occurring on the dorsal midfoot and forefoot. Mild tenderness over the tarsal bones but no specific area of tenderness. Normal range of motion. Normal strength resistance. Normal gait. Small nodular raised area overlying the third metatarsal shaft Has a fairly high arch without weightbearing but tends to flatten out upon standing. Neurovascular intact     ASSESSMENT & PLAN:   Left foot pain Unclear as to the source of her bruising.  Pain is likely associated with the swelling but has no specific area of tenderness.  No specific inciting event other than wearing the new shoes with the strap across the midfoot.  Possibly for disruption of some of the ligamentous of the tarsometatarsal bones. -X-ray today. -Counseled on supportive care. -If no improvement may need to consider using a postop shoe

## 2018-10-25 ENCOUNTER — Telehealth: Payer: Self-pay | Admitting: Family Medicine

## 2018-10-25 NOTE — Telephone Encounter (Signed)
Left VM for patient. If she calls back please have her speak with a nurse/CMA and inform her xray shows a heel spur on the bottom of her heel but no changes where she was bruising. The [facility] can report results to patient.   If any questions then please take the best time and phone number to call and I will try to call her back.   Rosemarie Ax, MD [facility] 10/25/2018, 11:28 AM

## 2018-10-25 NOTE — Assessment & Plan Note (Signed)
Unclear as to the source of her bruising.  Pain is likely associated with the swelling but has no specific area of tenderness.  No specific inciting event other than wearing the new shoes with the strap across the midfoot.  Possibly for disruption of some of the ligamentous of the tarsometatarsal bones. -X-ray today. -Counseled on supportive care. -If no improvement may need to consider using a postop shoe

## 2018-10-31 ENCOUNTER — Other Ambulatory Visit: Payer: Self-pay | Admitting: Internal Medicine

## 2019-01-05 LAB — HM MAMMOGRAPHY

## 2019-01-17 ENCOUNTER — Encounter: Payer: Self-pay | Admitting: Internal Medicine

## 2019-01-17 ENCOUNTER — Encounter: Payer: Self-pay | Admitting: Nurse Practitioner

## 2019-01-17 ENCOUNTER — Ambulatory Visit: Payer: Medicare Other | Admitting: Nurse Practitioner

## 2019-01-17 VITALS — BP 130/70 | HR 63 | Temp 98.3°F | Ht 64.0 in | Wt 179.0 lb

## 2019-01-17 DIAGNOSIS — H6123 Impacted cerumen, bilateral: Secondary | ICD-10-CM | POA: Diagnosis not present

## 2019-01-17 DIAGNOSIS — J309 Allergic rhinitis, unspecified: Secondary | ICD-10-CM | POA: Diagnosis not present

## 2019-01-17 MED ORDER — FLUTICASONE PROPIONATE 50 MCG/ACT NA SUSP: 2.0000 | Freq: Every day | NASAL | 1 refills | Status: DC

## 2019-01-17 NOTE — Patient Instructions (Addendum)
Please try switching your claritin to zyrtec or allegra.  I have sent a prescription for flonase nasal spray to your pharmacy- you may start with 2 sprays in each nostril daily then reduce to 1 spray in each nostril daily when your symptoms improve.  Earwax Buildup, Adult The ears produce a substance called earwax that helps keep bacteria out of the ear and protects the skin in the ear canal. Occasionally, earwax can build up in the ear and cause discomfort or hearing loss. What increases the risk? This condition is more likely to develop in people who:  Are female.  Are elderly.  Naturally produce more earwax.  Clean their ears often with cotton swabs.  Use earplugs often.  Use in-ear headphones often.  Wear hearing aids.  Have narrow ear canals.  Have earwax that is overly thick or sticky.  Have eczema.  Are dehydrated.  Have excess hair in the ear canal. What are the signs or symptoms? Symptoms of this condition include:  Reduced or muffled hearing.  A feeling of fullness in the ear or feeling that the ear is plugged.  Fluid coming from the ear.  Ear pain.  Ear itch.  Ringing in the ear.  Coughing.  An obvious piece of earwax that can be seen inside the ear canal. How is this diagnosed? This condition may be diagnosed based on:  Your symptoms.  Your medical history.  An ear exam. During the exam, your health care provider will look into your ear with an instrument called an otoscope. You may have tests, including a hearing test. How is this treated? This condition may be treated by:  Using ear drops to soften the earwax.  Having the earwax removed by a health care provider. The health care provider may: ? Flush the ear with water. ? Use an instrument that has a loop on the end (curette). ? Use a suction device.  Surgery to remove the wax buildup. This may be done in severe cases. Follow these instructions at home:   Take over-the-counter and  prescription medicines only as told by your health care provider.  Do not put any objects, including cotton swabs, into your ear. You can clean the opening of your ear canal with a washcloth or facial tissue.  Follow instructions from your health care provider about cleaning your ears. Do not over-clean your ears.  Drink enough fluid to keep your urine clear or pale yellow. This will help to thin the earwax.  Keep all follow-up visits as told by your health care provider. If earwax builds up in your ears often or if you use hearing aids, consider seeing your health care provider for routine, preventive ear cleanings. Ask your health care provider how often you should schedule your cleanings.  If you have hearing aids, clean them according to instructions from the manufacturer and your health care provider. Contact a health care provider if:  You have ear pain.  You develop a fever.  You have blood, pus, or other fluid coming from your ear.  You have hearing loss.  You have ringing in your ears that does not go away.  Your symptoms do not improve with treatment.  You feel like the room is spinning (vertigo). Summary  Earwax can build up in the ear and cause discomfort or hearing loss.  The most common symptoms of this condition include reduced or muffled hearing and a feeling of fullness in the ear or feeling that the ear is plugged.  This condition may be diagnosed based on your symptoms, your medical history, and an ear exam.  This condition may be treated by using ear drops to soften the earwax or by having the earwax removed by a health care provider.  Do not put any objects, including cotton swabs, into your ear. You can clean the opening of your ear canal with a washcloth or facial tissue. This information is not intended to replace advice given to you by your health care provider. Make sure you discuss any questions you have with your health care provider. Document  Released: 12/25/2004 Document Revised: 10/29/2017 Document Reviewed: 01/28/2017 Elsevier Interactive Patient Education  2019 [employer].

## 2019-01-17 NOTE — Progress Notes (Signed)
Virginia [name] is a 78 y.o. female with the following history as recorded in EpicCare:  Patient Active Problem List   Diagnosis Date Noted  . Caregiver stress 07/21/2018  . Left foot pain 05/13/2017  . Prediabetes 04/19/2016  . Constipation 04/16/2016  . Bilateral thumb pain 04/16/2016  . Dysphagia 10/17/2015  . Chronic kidney disease 08/13/2015  . Osteopenia 06/21/2014  . Back pain   . Sciatica of right side 05/12/2011  . 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 OSTEOARTHRITIS 01/01/2010  . FIBROIDS, UTERUS 12/13/2009  . Hyperlipidemia 12/13/2009  . Essential hypertension 12/13/2009  . ALLERGIC RHINITIS 12/13/2009  . COLONIC POLYPS, HX OF 12/13/2009    Current Outpatient Medications  Medication Sig Dispense Refill  . aspirin 81 MG tablet Take 81 mg by mouth daily.      . Choline Fenofibrate (FENOFIBRIC ACID) 135 MG CPDR TAKE 1 CAPSULE BY MOUTH EVERY DAY 90 capsule 1  . loratadine (CLARITIN) 10 MG tablet Take 10 mg by mouth daily.    . metoprolol tartrate (LOPRESSOR) 50 MG tablet TAKE 1 TABLET BY MOUTH TWICE A DAY 180 tablet 1  . Omega-3 Fatty Acids (FISH OIL) 1000 MG CAPS Take 1,000-2,000 mg by mouth 2 (two) times daily. 2000 mg in the morning and 1000 mg at night    . fluticasone (FLONASE) 50 MCG/ACT nasal spray Place 2 sprays into both nostrils daily. 16 g 1   No current facility-administered medications for this visit.     Allergies: Penicillins and Statins  Past Medical History:  Diagnosis Date  . ALLERGIC RHINITIS   . Allergy   . Cataract    beginning stage in lt. eye  . DIVERTICULITIS, HX OF   . FIBROIDS, UTERUS    s/p fibroidectomy  . GERD   . HYPERLIPIDEMIA    intol of statins (myalgia, thumb pain)  . HYPERTENSION   . OSTEOARTHRITIS   . Osteopenia 06/21/2014   DEXA@LB  06/14/14: -1.6  . 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 child birth  65 & 49  . COLONOSCOPY    . Fibroidectomy    . Low back  surgery  01/1994   2 bulging disc  . POLYPECTOMY    . TONSILLECTOMY  1968    Family History  Problem Relation Age of Onset  . Arthritis Mother   . Coronary artery disease Father   . Hyperlipidemia Brother   . Diabetes Maternal Aunt   . Hyperlipidemia Cousin   . Heart disease Cousin   . Diabetes Paternal Aunt   . Colon cancer Neg Hx   . Colon polyps Neg Hx   . Esophageal cancer Neg Hx   . Stomach cancer Neg Hx   . Rectal cancer Neg Hx     Social History   Tobacco Use  . Smoking status: Never Smoker  . Smokeless tobacco: Never Used  . 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 Alcohol use: No    Alcohol/week: 0.0 standard drinks     Subjective:  Ms Goswick is here today requesting evaluation of RIGHT EAR pressure, decreased hearing, "muffled" sound for past 1 week Denies fevers, chills, drainage, injury, pain, headaches, sinus pain/pressure, tinnitus, dizziness, weakness. Hx allergies- on claritin daily for some time Hx earwax impactions in the past.  ROS- See HPI  Objective:  Vitals:   01/17/19 1055 01/17/19 1143  BP: (!) 160/82 130/70  Pulse:  63   Temp: 98.3 F (36.8 C)   TempSrc: Oral   SpO2: 98%   Weight: 179 lb (81.2 kg)   Height: 5\' 4"  (1.626 m)     General: Well developed, well nourished, in no acute distress  Skin : Warm and dry.  Head: Normocephalic and atraumatic  Eyes: Sclera and conjunctiva clear; pupils round and reactive to light; extraocular movements intact  Ears: External normal; bilateral canals impacted with copious hard wax, examination post ear lavage: right canal is clear, left canal with scant cerumen, no bleeding or complications noted, right ear effusion is present without erythema Oropharynx: Pink, supple. No suspicious lesions  Neck: Supple without thyromegaly, adenopathy  Lungs: Effort unlabored, no respiratory distress  CVS exam: normal rate and regular rhythm, S1 and S2  normal.  Extremities: No edema, cyanosis, clubbing  Vessels: Symmetric bilaterally  Neurologic: Alert and oriented; speech intact; face symmetrical; moves all extremities well; CNII-XII intact without focal deficit  Psychiatric: Normal mood and affect.  Assessment:  1. Hearing loss due to cerumen impaction, bilateral   2. Allergic rhinitis, unspecified seasonality, unspecified trigger     Plan:   Irrigation/lavage of bilateral ears with removal of impacted cerumen Patient tolerated well with improvement in symptoms upon completion Right ear with Fluid effusion noted, ear does not appear infected, we discussed trying a different antihistamine along with flonase course- med dosing, side effects discussed  Home management, red flags and return precautions including when to seek immediate care discussed and printed on AVS She will F/u for new or worsening symptoms or if symptoms don't improve in 1 week  No follow-ups on file.  No orders of the defined types were placed in this encounter.   Requested Prescriptions   Signed Prescriptions Disp Refills  . fluticasone (FLONASE) 50 MCG/ACT nasal spray 16 g 1    Sig: Place 2 sprays into both nostrils daily.

## 2019-01-17 NOTE — Progress Notes (Signed)
Patient consent obtained. Irrigation with water and peroxide performed. Full view of tympanic membranes after procedure.  Patient tolerated procedure well.   

## 2019-01-25 NOTE — Progress Notes (Signed)
Subjective:    Patient ID: Virginia [name], female    DOB: 1941-01-09, 78 y.o.   MRN: [id number]  HPI The patient is here for follow up.  She is still dealing with her husband's Alzheimer's disease, which has gotten slightly worse.  She has been in daycare for 5 days a week and that does help.  She does have some increased stress and anxiety related to this, but does not feel that she needs any medication.  She does have family in the area that are supportive.  Hypertension: She is taking her medication daily. She is not compliant with a low sodium diet.  She denies chest pain, palpitations, edema, shortness of breath and regular headaches. She is not exercising regularly.      Hyperlipidemia: She is taking her medication daily. She is compliant with a low fat/cholesterol diet. She is not exercising regularly. She denies myalgias.   Prediabetes:  She is not compliant with a low sugar/carbohydrate diet.  She is not exercising regularly.   Medications and allergies reviewed with patient and updated if appropriate.  Patient Active Problem List   Diagnosis Date Noted  . Caregiver stress 07/21/2018  . Left foot pain 05/13/2017  . Prediabetes 04/19/2016  . Constipation 04/16/2016  . Bilateral thumb pain 04/16/2016  . Dysphagia 10/17/2015  . Chronic kidney disease 08/13/2015  . Osteopenia 06/21/2014  . Back pain   . Sciatica of right side 05/12/2011  . 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 OSTEOARTHRITIS 01/01/2010  . FIBROIDS, UTERUS 12/13/2009  . Hyperlipidemia 12/13/2009  . Essential hypertension 12/13/2009  . ALLERGIC RHINITIS 12/13/2009  . COLONIC POLYPS, HX OF 12/13/2009    Current Outpatient Medications on File Prior to Visit  Medication Sig Dispense Refill  . aspirin 81 MG tablet Take 81 mg by mouth daily.      . Choline Fenofibrate (FENOFIBRIC ACID) 135 MG CPDR TAKE 1 CAPSULE BY MOUTH EVERY DAY 90 capsule 1  . fluticasone (FLONASE) 50 MCG/ACT nasal spray Place 2 sprays into  both nostrils daily. 16 g 1  . loratadine (CLARITIN) 10 MG tablet Take 10 mg by mouth daily.    . metoprolol tartrate (LOPRESSOR) 50 MG tablet TAKE 1 TABLET BY MOUTH TWICE A DAY 180 tablet 1  . Omega-3 Fatty Acids (FISH OIL) 1000 MG CAPS Take 1,000-2,000 mg by mouth 2 (two) times daily. 2000 mg in the morning and 1000 mg at night     No current facility-administered medications on file prior to visit.     Past Medical History:  Diagnosis Date  . ALLERGIC RHINITIS   . Allergy   . Cataract    beginning stage in lt. eye  . DIVERTICULITIS, HX OF   . FIBROIDS, UTERUS    s/p fibroidectomy  . GERD   . HYPERLIPIDEMIA    intol of statins (myalgia, thumb pain)  . HYPERTENSION   . OSTEOARTHRITIS   . Osteopenia 06/21/2014   DEXA@LB  06/14/14: -1.6  . 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 child birth  25 & 52  . COLONOSCOPY    . Fibroidectomy    . Low back surgery  01/1994   2 bulging disc  . POLYPECTOMY    . TONSILLECTOMY  1968    Social History   Socioeconomic History  . Marital status: Married    Spouse name: Not on file  . Number of children: 2  . Years of education:  Not on file  . Highest education level: Not on file  Occupational History  . Not on file  Social Needs  . Financial resource strain: Not hard at all  . Food insecurity:    Worry: Never true    Inability: Never true  . Transportation needs:    Medical: No    Non-medical: No  Tobacco Use  . Smoking status: Never Smoker  . Smokeless tobacco: Never Used  . 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 Alcohol use: No    Alcohol/week: 0.0 standard drinks  . Drug use: No  . Sexual activity: Not Currently  Lifestyle  . Physical activity:    Days per week: 2 days    Minutes per session: 30 min  . Stress: Rather much  Relationships  . Social connections:    Talks on phone: More  than three times a week    Gets together: More than three times a week    Attends religious service: More than 4 times per year    Active member of club or organization: Yes    Attends meetings of clubs or organizations: 1 to 4 times per year    Relationship status: Married  Other Topics Concern  . Not on file  Social History Narrative   Lives with spouse / 2 children;   One in [place] and one is in [place]   Retired Pharmacist, hospital    Family History  Problem Relation Age of Onset  . Arthritis Mother   . Coronary artery disease Father   . Hyperlipidemia Brother   . Diabetes Maternal Aunt   . Hyperlipidemia Cousin   . Heart disease Cousin   . Diabetes Paternal Aunt   . Colon cancer Neg Hx   . Colon polyps Neg Hx   . Esophageal cancer Neg Hx   . Stomach cancer Neg Hx   . Rectal cancer Neg Hx     Review of Systems  Constitutional: Negative for chills and fever.  HENT: Positive for tinnitus. Negative for hearing loss.   Respiratory: Positive for cough (? from dryness in air). Negative for shortness of breath and wheezing.   Cardiovascular: Positive for palpitations (? stress related). Negative for chest pain and leg swelling.  Musculoskeletal: Positive for back pain (in mornings).  Neurological: Negative for light-headedness and headaches.       Objective:   Vitals:   01/26/19 1057  BP: (!) 118/54  Pulse: 65  Resp: 16  Temp: 98.2 F (36.8 C)  SpO2: 97%   BP Readings from Last 3 Encounters:  01/26/19 (!) 118/54  01/17/19 130/70  10/22/18 130/70   Wt Readings from Last 3 Encounters:  01/26/19 178 lb (80.7 kg)  01/17/19 179 lb (81.2 kg)  10/22/18 177 lb (80.3 kg)   Body mass index is 30.55 kg/m.   Physical Exam    Constitutional: Appears well-developed and well-nourished. No distress.  HENT:  Head: Normocephalic and atraumatic.  Neck: Neck supple. No tracheal deviation present. No thyromegaly present.  No cervical lymphadenopathy Cardiovascular: Normal rate,  regular rhythm and normal heart sounds.   No murmur heard. No carotid bruit .  No edema Pulmonary/Chest: Effort normal and breath sounds normal. No respiratory distress. No has no wheezes. No rales.  Skin: Skin is warm and dry. Not diaphoretic.  Psychiatric: Normal mood and affect. Behavior is normal.      Assessment & Plan:    See Problem List  for Assessment and Plan of chronic medical problems.

## 2019-01-26 ENCOUNTER — Ambulatory Visit: Payer: Medicare Other | Admitting: Internal Medicine

## 2019-01-26 ENCOUNTER — Encounter: Payer: Self-pay | Admitting: Internal Medicine

## 2019-01-26 ENCOUNTER — Other Ambulatory Visit (INDEPENDENT_AMBULATORY_CARE_PROVIDER_SITE_OTHER): Payer: Medicare Other

## 2019-01-26 VITALS — BP 118/54 | HR 65 | Temp 98.2°F | Resp 16 | Ht 64.0 in | Wt 178.0 lb

## 2019-01-26 DIAGNOSIS — I10 Essential (primary) hypertension: Secondary | ICD-10-CM

## 2019-01-26 DIAGNOSIS — R7303 Prediabetes: Secondary | ICD-10-CM

## 2019-01-26 DIAGNOSIS — E7849 Other hyperlipidemia: Secondary | ICD-10-CM | POA: Diagnosis not present

## 2019-01-26 LAB — HEMOGLOBIN A1C: Hgb A1c MFr Bld: 6.4 % (ref 4.6–6.5)

## 2019-01-26 LAB — COMPREHENSIVE METABOLIC PANEL
ALBUMIN: 4 g/dL (ref 3.5–5.2)
ALT: 12 U/L (ref 0–35)
AST: 11 U/L (ref 0–37)
Alkaline Phosphatase: 42 U/L (ref 39–117)
BILIRUBIN TOTAL: 0.4 mg/dL (ref 0.2–1.2)
BUN: 26 mg/dL — AB (ref 6–23)
CALCIUM: 10.8 mg/dL — AB (ref 8.4–10.5)
CHLORIDE: 106 meq/L (ref 96–112)
CO2: 28 mEq/L (ref 19–32)
CREATININE: 0.94 mg/dL (ref 0.40–1.20)
GFR: 57.89 mL/min — ABNORMAL LOW (ref >60.00)
Glucose, Bld: 104 mg/dL — ABNORMAL HIGH (ref 70–99)
Potassium: 4.6 mEq/L (ref 3.5–5.1)
SODIUM: 141 meq/L (ref 135–145)
Total Protein: 6.6 g/dL (ref 6.0–8.3)

## 2019-01-26 LAB — LDL CHOLESTEROL, DIRECT: LDL DIRECT: 130 mg/dL

## 2019-01-26 LAB — LIPID PANEL
CHOL/HDL RATIO: 4
CHOLESTEROL: 182 mg/dL (ref 0–200)
HDL: 45.3 mg/dL (ref >39.00)
NonHDL: 136.76
TRIGLYCERIDES: 201 mg/dL — AB (ref 0.0–149.0)
VLDL: 40.2 mg/dL — ABNORMAL HIGH (ref 0.0–40.0)

## 2019-01-26 MED ORDER — FENOFIBRIC ACID 135 MG PO CPDR: 1.0000 | DELAYED_RELEASE_CAPSULE | Freq: Every day | ORAL | 1 refills | Status: DC

## 2019-01-26 NOTE — Assessment & Plan Note (Signed)
BP well controlled Current regimen effective and well tolerated Continue current medications at current doses cmp  

## 2019-01-26 NOTE — Assessment & Plan Note (Signed)
Check a1c Low sugar / carb diet Stressed regular exercise   

## 2019-01-26 NOTE — Assessment & Plan Note (Signed)
Check lipid panel  Statin intolerant Continue fenofibrate Noncompliant with a low fat/cholesterol diet Regular exercise and healthy diet encouraged

## 2019-01-26 NOTE — Patient Instructions (Signed)

## 2019-01-29 ENCOUNTER — Encounter: Payer: Self-pay | Admitting: Internal Medicine

## 2019-02-14 ENCOUNTER — Other Ambulatory Visit: Payer: Self-pay | Admitting: Nurse Practitioner

## 2019-02-15 ENCOUNTER — Ambulatory Visit: Payer: Self-pay

## 2019-02-15 DIAGNOSIS — H9209 Otalgia, unspecified ear: Secondary | ICD-10-CM

## 2019-02-15 DIAGNOSIS — H9319 Tinnitus, unspecified ear: Secondary | ICD-10-CM

## 2019-02-15 NOTE — Telephone Encounter (Signed)
Attempted to contact pt; she states that she is on the line with another MD; pt instructed to call back; she verbalized understanding.

## 2019-02-15 NOTE — Telephone Encounter (Signed)
Since inside of the earlobe normal after having a cleanout and a we should refer her to ENT.  Order placed.  If she would prefer to come back in here to be seen she can also do that, but ENT referral ordered

## 2019-02-15 NOTE — Telephone Encounter (Signed)
      Virginia [name] Female, 78 y.o., Nov 04, 1941 MRN:  [id number] Phone:  [phone] (H) ... PCP:  [name], MD Coverage:  Onnie Boer Marquez With Internal Medicine 07/29/2019 at 11:00 AM Message from Loma Boston sent at 02/15/2019 11:45 AM EDT   Summary: call back t   Pt wants a call back fomr the nurse... 78 yr old has seen Dr Quay Burow on the 2/26 and Caryl Pina on 2/17 for the same problem, right ear. It is still driving her crazy. Ashleigh cleaned out her ear and it did not help. She thinks that she needs an antibiotic but she is very fearful of coming back in want a nurse to call her and discuss whether or not that she truly needs to come in or that a script might help. Will be expecting a call from nurse.         Call History    Type Contact Phone  02/15/2019 11:41 AM Phone (Incoming) [name], [name] (Self) [phone] (H)  User: Loma Boston  Call home or mobile

## 2019-02-15 NOTE — Telephone Encounter (Signed)
Was seen on 01/17/19 by Ashleigh. Please advise.

## 2019-02-15 NOTE — Telephone Encounter (Signed)
Pt aware of response and agreed with seeing ENT.

## 2019-02-15 NOTE — Telephone Encounter (Signed)
Outgoing call to Patient who States that her ear is no better. Sates onset was a month ago.  Patient reports its not really pain more like and irritation. Patient report hearing air with each heart beat.  She hears a constant sound.  Patient reports that she thinks it is raising her blood pressure.    Patient would like to know if she should a specialist.  Or she go on antibiotic?  Patient Request a return  Call Please.  Patient had her ear cleaned out.                                                                                                                                                                                                                                                                                                                                                                                                                                                                                                                            Reason for Disposition .  Mild earache and ear congestion (fullness) occurring during air travel  Answer Assessment - Initial Assessment Questions 1. LOCATION: "Which ear is involved?"     right 2. ONSET: "When did the ear start hurting"       A month ago 3. SEVERITY: "How bad is the pain?"  (Scale 1-10; mild, moderate or severe)   - MILD (1-3): doesn't interfere with normal activities    - MODERATE (4-7): interferes with normal activities or awakens from sleep    - SEVERE (8-10): excruciating pain, unable to do any normal activities      Not much pain more irritation . Each heart beat hearing air with each heart beat constant sound , feel with finger  I hear air blowing.  I  Feel it raises my blood pressure  4. URI SYMPTOMS: " Do you have a runny nose or cough?"    Sometimes and have allergy, dry cough   5. FEVER: "Do you have a fever?" If so, ask: "What is your temperature, how was it measured, and when did it start?"     denies 6. CAUSE: "Have you  been swimming recently?", "How often do you use Q-TIPS?", "Have you had any recent air travel or scuba diving?"     denies 7. OTHER SYMPTOMS: "Do you have any other symptoms?" (e.g., headache, stiff neck, dizziness, vomiting, runny nose, decreased hearing)     Alittle stiffness and a little dizzness a little headache.   8. PREGNANCY: "Is there any chance you are pregnant?" "When was your last menstrual period?"     na  Protocols used: EARACHE-A-AH

## 2019-03-01 ENCOUNTER — Telehealth (INDEPENDENT_AMBULATORY_CARE_PROVIDER_SITE_OTHER): Payer: Medicare Other | Admitting: Internal Medicine

## 2019-03-01 ENCOUNTER — Encounter: Payer: Medicare Other | Admitting: Internal Medicine

## 2019-03-01 DIAGNOSIS — H9201 Otalgia, right ear: Secondary | ICD-10-CM | POA: Diagnosis not present

## 2019-03-01 NOTE — Telephone Encounter (Addendum)
Cumulative time during 7-day interval 8 minutes, there was not an associated office visit for this concern within a 7 day period.  Verbal consent for services obtained from patient prior to services given.  Names of all persons present for services: [name], MD, Gery Pray   Chief complaint: Ear complaint, right  She has a heartbeat in her ear. It started over a month ago. It feels like air being pushed in the ear when she hears the heartbeat.  It affects her sleep.  Her hearing is decreased in the right ear.  The heartbeat sound is louder when she tilts her head to the right and quiet this if she tilts her head forward.  She has some dizziness.  She denies ear pain, nasal congestion, headaches or sinus pain.   She has occasional pressure in her ear.    She is using the nasal spray daily for congestion and has been taking allergy tablets.  She initially she saw our nurse practitioner on 2/17.  Her ear was cleaned out.  There was no evidence of infection.  She was advised to take the allergy medication.  I saw her about a week later and she still had complaints, but her ear was normal at that time.  I did refer her to ENT after she called back on 3/17 with persistent symptoms.  She has not heard from them and is unsure if they will see her with the current coronavirus situation.  She is frustrated that her symptoms are persistent.  They are affecting her sleep and uncomfortable.  She thinks it makes her blood pressure go up.    History, background, results pertinent:  Past Medical History:  Diagnosis Date  . ALLERGIC RHINITIS   . Allergy   . Cataract    beginning stage in lt. eye  . DIVERTICULITIS, HX OF   . FIBROIDS, UTERUS    s/p fibroidectomy  . GERD   . HYPERLIPIDEMIA    intol of statins (myalgia, thumb pain)  . HYPERTENSION   . OSTEOARTHRITIS   . Osteopenia 06/21/2014   DEXA@LB  06/14/14: -1.6  . 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  Associated symptoms of decreased hearing and some mild dizziness.  Symptoms started over a month ago  It does not sound like eustachian tube dysfunction.  Claritin and Flonase seem to be helping her allergy symptoms and she will continue these.  She may have pulsatile tinnitus or common tinnitus.  Would consider carotid artery ultrasound since she has not had this done in the past, but unable to get at this time due to the coronavirus situation.  I think ENT will be able to give [place] a definitive diagnosis.  I will see if we can get her into a practice that is still seeing patients.  At this point no change in therapy-continue allergy medications.  I did ask her to monitor her blood pressure over the next few days just to make sure her blood pressure was not elevated and possibly causing some symptoms.  She will call back if she has any questions or concerns.

## 2019-03-01 NOTE — Telephone Encounter (Signed)
Copied from [place] [id number]. Topic: Quick Communication - See Telephone Encounter >> Mar 01, 2019  8:11 AM Rayann Heman wrote: CRM for notification. See Telephone encounter for: 03/01/19. Pt called and left a voicemail that states that she would like to take to Dr Quay Burow or her nurse about an ear condition that she has. Pt did not give details in voicemail. Please advise

## 2019-03-01 NOTE — Progress Notes (Signed)
This encounter was created in error - please disregard.

## 2019-03-01 NOTE — Telephone Encounter (Signed)
Pt has an appointment set for today.

## 2019-03-10 ENCOUNTER — Other Ambulatory Visit: Payer: Self-pay | Admitting: *Deleted

## 2019-03-10 MED ORDER — FLUTICASONE PROPIONATE 50 MCG/ACT NA SUSP: NASAL | 1 refills | Status: DC

## 2019-04-01 ENCOUNTER — Other Ambulatory Visit: Payer: Self-pay | Admitting: Internal Medicine

## 2019-04-24 ENCOUNTER — Other Ambulatory Visit: Payer: Self-pay | Admitting: Internal Medicine

## 2019-04-26 ENCOUNTER — Other Ambulatory Visit: Payer: Self-pay | Admitting: Otolaryngology

## 2019-04-26 DIAGNOSIS — H9311 Tinnitus, right ear: Secondary | ICD-10-CM

## 2019-04-29 ENCOUNTER — Other Ambulatory Visit: Payer: Self-pay | Admitting: Internal Medicine

## 2019-05-04 ENCOUNTER — Other Ambulatory Visit: Payer: Self-pay | Admitting: Internal Medicine

## 2019-05-05 MED ORDER — FLUTICASONE PROPIONATE 50 MCG/ACT NA SUSP: 2.0000 | Freq: Every day | NASAL | 1 refills | Status: DC

## 2019-05-05 NOTE — Addendum Note (Signed)
Addended by: Earnstine Regal on: 05/05/2019 01:36 PM   Modules accepted: Orders

## 2019-05-05 NOTE — Telephone Encounter (Signed)
Resent flonase to [facility]

## 2019-05-05 NOTE — Telephone Encounter (Signed)
Resent flonase rx to [facility]

## 2019-05-09 ENCOUNTER — Ambulatory Visit
Admission: RE | Admit: 2019-05-09 | Discharge: 2019-05-09 | Disposition: A | Discharge status: Home / Self Care | Payer: Medicare Other | Source: Ambulatory Visit | Attending: Otolaryngology | Admitting: Otolaryngology

## 2019-05-09 ENCOUNTER — Other Ambulatory Visit: Payer: Self-pay

## 2019-05-09 DIAGNOSIS — H9311 Tinnitus, right ear: Secondary | ICD-10-CM

## 2019-05-09 MED ORDER — IOPAMIDOL (ISOVUE-370) INJECTION 76%
75.0000 mL | Freq: Once | INTRAVENOUS | Status: AC | PRN
  Administered 2019-05-09: 75 mL via INTRAVENOUS

## 2019-05-18 ENCOUNTER — Ambulatory Visit: Payer: Self-pay | Admitting: Internal Medicine

## 2019-05-18 NOTE — Telephone Encounter (Signed)
Pt reports "Probably broke toe Monday" States ran into corner of table, right foot, 5th toe "bent" Reports has taped toe to 4th toe. States swelling is down and bruising is resolving. NO warmth, No open wounds. IS able to walk and wear shoe.  States remains painful, 5-6 /10. Home care advise given. Assured pt [place] would route to practice for Dr. Quay Burow review. After hours call.  Reason for Disposition . Stubbed or jammed toe  Answer Assessment - Initial Assessment Questions 1. MECHANISM: "How did the injury happen?"      Jammed it into table 2. ONSET: "When did the injury happen?" (Minutes or hours ago)      Monday 3. LOCATION: "What part of the toe is injured?" "Is the nail damaged?"      joint 4. APPEARANCE of TOE INJURY: "What does the injury look like?"      Bruised at base 5. SEVERITY: "Can you use the foot normally?" "Can you walk?"      WNL 6. SIZE: For cuts, bruises, or swelling, ask: "How large is it?" (e.g., inches or centimeters;  entire toe)     no 7. PAIN: "Is there pain?" If so, ask: "How bad is the pain?"   (e.g., Scale 1-10; or mild, moderate, severe)     5-6/10 8. TETANUS: For any breaks in the skin, ask: "When was the last tetanus booster?"     none 9. DIABETES: "Do you have a history of diabetes or poor circulation in the feet?"   no 10. OTHER SYMPTOMS: "Do you have any other symptoms?"        no  Protocols used: TOE INJURY-A-AH

## 2019-05-19 NOTE — Telephone Encounter (Signed)
Can patient be worked in

## 2019-05-19 NOTE — Telephone Encounter (Signed)
Should we put her in the 915 tomorrow

## 2019-05-20 NOTE — Telephone Encounter (Signed)
Called pt to offer her an appt this morning & she stated she would like to wait & see if it will heal on its own.

## 2019-05-23 NOTE — Progress Notes (Signed)
Subjective:    Patient ID: Virginia [name], female    DOB: [birth year], 78 y.o.   MRN: [id number]  HPI The patient is here for an acute visit.  She is not exercising regularly.  Her husband just died last week.     ? Broken toe:  She banged her right fifth toe and it went sideways.  She wrapped it to the next toe.  It hurts but ibuprofen helps the pain.  She can walk on it.    Ear symptoms:  She started having discomfort and noise in her ear in Feb 2020.  She had her ear cleaned out here, but it was no better.  She saw ENT twice - Dr Lucia Gaskins.  She had a Ct scan of her head which was normal.  She has a whoosing air sound/ heartbeat sound through her ear all the time. She does note some mild hearing loss in the ear, but does not think it is much.  She denies ear pain or cold symptoms.   SOB at times:  She has SOB at times.  This has been going on for a while, but has gotten worse.  She is also tired.  She is not exercising regularly.  She has had some mild leg swelling.  She has not been eating as healthy recently.  She denies chest pain, palps, cough, wheeze.    Hypertension: She is taking her medication daily. She has not been compliant with a low sodium diet.       Prediabetes:  She has been less compliant with a low sugar/carbohydrate diet.  She is not exercising regularly.   Medications and allergies reviewed with patient and updated if appropriate.  Patient Active Problem List   Diagnosis Date Noted  . DOE (dyspnea on exertion) 05/24/2019  . Tinnitus 05/24/2019  . Caregiver stress 07/21/2018  . Left foot pain 05/13/2017  . Prediabetes 04/19/2016  . Constipation 04/16/2016  . Bilateral thumb pain 04/16/2016  . Dysphagia 10/17/2015  . Chronic kidney disease 08/13/2015  . Osteopenia 06/21/2014  . Back pain   . Sciatica of right side 05/12/2011  . 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 OSTEOARTHRITIS 01/01/2010  . FIBROIDS, UTERUS 12/13/2009  . Hyperlipidemia 12/13/2009  .  Essential hypertension 12/13/2009  . ALLERGIC RHINITIS 12/13/2009  . COLONIC POLYPS, HX OF 12/13/2009    Current Outpatient Medications on File Prior to Visit  Medication Sig Dispense Refill  . aspirin 81 MG tablet Take 81 mg by mouth daily.      . Choline Fenofibrate (FENOFIBRIC ACID) 135 MG CPDR Take 1 tablet by mouth daily. 90 capsule 1  . fluticasone (FLONASE) 50 MCG/ACT nasal spray Place 2 sprays into both nostrils daily. 16 g 1  . loratadine (CLARITIN) 10 MG tablet Take 10 mg by mouth daily.    . metoprolol tartrate (LOPRESSOR) 50 MG tablet TAKE 1 TABLET BY MOUTH TWICE A DAY 180 tablet 1  . Omega-3 Fatty Acids (FISH OIL) 1000 MG CAPS Take 1,000-2,000 mg by mouth 2 (two) times daily. 2000 mg in the morning and 1000 mg at night     No current facility-administered medications on file prior to visit.     Past Medical History:  Diagnosis Date  . ALLERGIC RHINITIS   . Allergy   . Cataract    beginning stage in lt. eye  . DIVERTICULITIS, HX OF   . FIBROIDS, UTERUS    s/p fibroidectomy  . GERD   . HYPERLIPIDEMIA    intol  of statins (myalgia, thumb pain)  . HYPERTENSION   . OSTEOARTHRITIS   . Osteopenia 06/21/2014   DEXA@LB  06/14/14: -1.6  . 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 child birth  37 & 27  . COLONOSCOPY    . Fibroidectomy    . Low back surgery  01/1994   2 bulging disc  . POLYPECTOMY    . TONSILLECTOMY  1968    Social History   Socioeconomic History  . Marital status: Married    Spouse name: Not on file  . Number of children: 2  . Years of education: Not on file  . Highest education level: Not on file  Occupational History  . Not on file  Social Needs  . Financial resource strain: Not hard at all  . Food insecurity    Worry: Never true    Inability: Never true  . Transportation needs    Medical: No    Non-medical: No  Tobacco Use  . Smoking status: Never Smoker  . Smokeless  tobacco: Never Used  . 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 Alcohol use: No    Alcohol/week: 0.0 standard drinks  . Drug use: No  . Sexual activity: Not Currently  Lifestyle  . Physical activity    Days per week: 2 days    Minutes per session: 30 min  . Stress: Rather much  Relationships  . Social connections    Talks on phone: More than three times a week    Gets together: More than three times a week    Attends religious service: More than 4 times per year    Active member of club or organization: Yes    Attends meetings of clubs or organizations: 1 to 4 times per year    Relationship status: Married  Other Topics Concern  . Not on file  Social History Narrative   Lives with spouse / 2 children;   One in [place] and one is in [place]   Retired Pharmacist, hospital    Family History  Problem Relation Age of Onset  . Arthritis Mother   . Coronary artery disease Father   . Hyperlipidemia Brother   . Diabetes Maternal Aunt   . Hyperlipidemia Cousin   . Heart disease Cousin   . Diabetes Paternal Aunt   . Colon cancer Neg Hx   . Colon polyps Neg Hx   . Esophageal cancer Neg Hx   . Stomach cancer Neg Hx   . Rectal cancer Neg Hx     Review of Systems  Constitutional: Negative for chills and fever.  HENT: Negative for ear discharge, ear pain and hearing loss.   Respiratory: Positive for shortness of breath (with minimal exertion). Negative for cough and wheezing.   Cardiovascular: Positive for leg swelling. Negative for chest pain and palpitations.  Endocrine: Positive for polydipsia.  Neurological: Negative for light-headedness and headaches.       Objective:   Vitals:   05/24/19 1606  BP: 138/60  Pulse: 83  Resp: 17  Temp: 98.3 F (36.8 C)  SpO2: 98%   BP Readings from Last 3 Encounters:  05/24/19 138/60  01/26/19 (!) 118/54  01/17/19 130/70   Wt Readings from Last 3 Encounters:  05/24/19 181 lb  (82.1 kg)  01/26/19 178 lb (80.7 kg)  01/17/19 179 lb (81.2 kg)   Body mass index  is 31.07 kg/m.   Physical Exam    GENERAL APPEARANCE: Appears stated age, well appearing, NAD EYES: conjunctiva clear, no icterus HEENT: bilateral tympanic membranes and ear canals normal, no thyromegaly, trachea midline, no cervical or supraclavicular lymphadenopathy LUNGS: Clear to auscultation without wheeze or crackles, unlabored breathing, good air entry bilaterally CARDIOVASCULAR: Normal S1,S2 without murmurs, trace b/l LE edema Msk:  Right 4th and 5th toes taped together, no right foot pain with palpation,  Right 5th toe tender SKIN: Warm, dry      Assessment & Plan:    See Problem List for Assessment and Plan of chronic medical problems.

## 2019-05-24 ENCOUNTER — Other Ambulatory Visit: Payer: Self-pay

## 2019-05-24 ENCOUNTER — Other Ambulatory Visit (INDEPENDENT_AMBULATORY_CARE_PROVIDER_SITE_OTHER): Payer: Medicare Other

## 2019-05-24 ENCOUNTER — Encounter: Payer: Self-pay | Admitting: Internal Medicine

## 2019-05-24 ENCOUNTER — Ambulatory Visit (INDEPENDENT_AMBULATORY_CARE_PROVIDER_SITE_OTHER): Payer: Medicare Other | Admitting: Internal Medicine

## 2019-05-24 VITALS — BP 138/60 | HR 83 | Temp 98.3°F | Resp 17 | Ht 64.0 in | Wt 181.0 lb

## 2019-05-24 DIAGNOSIS — R7303 Prediabetes: Secondary | ICD-10-CM

## 2019-05-24 DIAGNOSIS — H9311 Tinnitus, right ear: Secondary | ICD-10-CM

## 2019-05-24 DIAGNOSIS — I10 Essential (primary) hypertension: Secondary | ICD-10-CM | POA: Diagnosis not present

## 2019-05-24 DIAGNOSIS — R0609 Other forms of dyspnea: Secondary | ICD-10-CM

## 2019-05-24 DIAGNOSIS — M79674 Pain in right toe(s): Secondary | ICD-10-CM | POA: Insufficient documentation

## 2019-05-24 DIAGNOSIS — H9319 Tinnitus, unspecified ear: Secondary | ICD-10-CM | POA: Insufficient documentation

## 2019-05-24 NOTE — Assessment & Plan Note (Signed)
Has had DOE for a while, but it is worse recently ? Cardiac in nature vs deconditioning Will check labs to r/o anemia, etc - cbc, cmp, tsh CXR Discussed cardiology evaluation - she would like to hold off for now - will see what the blood work and CXR show and will try to increase her activity

## 2019-05-24 NOTE — Assessment & Plan Note (Signed)
Possible fx - will check xray Continue to buddy tape Ibuprofen prn

## 2019-05-24 NOTE — Assessment & Plan Note (Signed)
The sounds she hear is likely tinnitus  She does have some right ear hearing loss Advised a hearing eval - she will go to [facility]

## 2019-05-24 NOTE — Patient Instructions (Signed)
Have blood work and Production manager today.     Have your hearing evaluated at [facility].

## 2019-05-24 NOTE — Assessment & Plan Note (Signed)
BP well controlled Current regimen effective and well tolerated Continue current medications at current doses cmp  

## 2019-05-24 NOTE — Assessment & Plan Note (Signed)
Check a1c Low sugar / carb diet Stressed regular exercise   

## 2019-05-25 ENCOUNTER — Telehealth: Payer: Self-pay | Admitting: Internal Medicine

## 2019-05-25 ENCOUNTER — Other Ambulatory Visit: Payer: Self-pay

## 2019-05-25 ENCOUNTER — Observation Stay (HOSPITAL_COMMUNITY)
Admission: EM | Admit: 2019-05-25 | Discharge: 2019-05-26 | Disposition: A | Discharge status: Home / Self Care | Payer: Medicare Other | Attending: Internal Medicine | Admitting: Internal Medicine

## 2019-05-25 ENCOUNTER — Emergency Department (HOSPITAL_COMMUNITY): Payer: Medicare Other

## 2019-05-25 ENCOUNTER — Encounter (HOSPITAL_COMMUNITY): Payer: Self-pay

## 2019-05-25 DIAGNOSIS — D649 Anemia, unspecified: Secondary | ICD-10-CM | POA: Diagnosis not present

## 2019-05-25 DIAGNOSIS — D6489 Other specified anemias: Principal | ICD-10-CM | POA: Insufficient documentation

## 2019-05-25 DIAGNOSIS — I129 Hypertensive chronic kidney disease with stage 1 through stage 4 chronic kidney disease, or unspecified chronic kidney disease: Secondary | ICD-10-CM | POA: Insufficient documentation

## 2019-05-25 DIAGNOSIS — R7303 Prediabetes: Secondary | ICD-10-CM

## 2019-05-25 DIAGNOSIS — Z7982 Long term (current) use of aspirin: Secondary | ICD-10-CM | POA: Insufficient documentation

## 2019-05-25 DIAGNOSIS — N189 Chronic kidney disease, unspecified: Secondary | ICD-10-CM | POA: Diagnosis not present

## 2019-05-25 DIAGNOSIS — W228XXA Striking against or struck by other objects, initial encounter: Secondary | ICD-10-CM | POA: Insufficient documentation

## 2019-05-25 DIAGNOSIS — I152 Hypertension secondary to endocrine disorders: Secondary | ICD-10-CM | POA: Diagnosis present

## 2019-05-25 DIAGNOSIS — E785 Hyperlipidemia, unspecified: Secondary | ICD-10-CM | POA: Diagnosis not present

## 2019-05-25 DIAGNOSIS — I10 Essential (primary) hypertension: Secondary | ICD-10-CM

## 2019-05-25 DIAGNOSIS — M8580 Other specified disorders of bone density and structure, unspecified site: Secondary | ICD-10-CM | POA: Diagnosis not present

## 2019-05-25 DIAGNOSIS — Z1159 Encounter for screening for other viral diseases: Secondary | ICD-10-CM | POA: Diagnosis not present

## 2019-05-25 DIAGNOSIS — Z79899 Other long term (current) drug therapy: Secondary | ICD-10-CM | POA: Diagnosis not present

## 2019-05-25 DIAGNOSIS — M1990 Unspecified osteoarthritis, unspecified site: Secondary | ICD-10-CM | POA: Diagnosis not present

## 2019-05-25 DIAGNOSIS — Z66 Do not resuscitate: Secondary | ICD-10-CM | POA: Insufficient documentation

## 2019-05-25 DIAGNOSIS — S92514A Nondisplaced fracture of proximal phalanx of right lesser toe(s), initial encounter for closed fracture: Secondary | ICD-10-CM

## 2019-05-25 DIAGNOSIS — R0602 Shortness of breath: Secondary | ICD-10-CM

## 2019-05-25 LAB — COMPREHENSIVE METABOLIC PANEL
ALT: 8 U/L (ref 0–35)
ALT: 12 U/L (ref 0–44)
AST: 9 U/L (ref 0–37)
AST: 12 U/L — ABNORMAL LOW (ref 15–41)
Albumin: 4 g/dL (ref 3.5–5.2)
Albumin: 3.4 g/dL — ABNORMAL LOW (ref 3.5–5.0)
Alkaline Phosphatase: 38 U/L — ABNORMAL LOW (ref 39–117)
Alkaline Phosphatase: 37 U/L — ABNORMAL LOW (ref 38–126)
Anion gap: 8 (ref 5–15)
BUN: 23 mg/dL (ref 6–23)
BUN: 23 mg/dL (ref 8–23)
CO2: 27 mEq/L (ref 19–32)
CO2: 26 mmol/L (ref 22–32)
Calcium: 9.9 mg/dL (ref 8.4–10.5)
Calcium: 9.7 mg/dL (ref 8.9–10.3)
Chloride: 107 mEq/L (ref 96–112)
Chloride: 107 mmol/L (ref 98–111)
Creatinine, Ser: 0.91 mg/dL (ref 0.40–1.20)
Creatinine, Ser: 0.86 mg/dL (ref 0.44–1.00)
GFR calc Af Amer: >60 mL/min (ref >60)
GFR calc non Af Amer: >60 mL/min (ref >60)
GFR: 60.04 mL/min (ref >60.00)
Glucose, Bld: 120 mg/dL — ABNORMAL HIGH (ref 70–99)
Glucose, Bld: 101 mg/dL — ABNORMAL HIGH (ref 70–99)
Potassium: 4.7 mEq/L (ref 3.5–5.1)
Potassium: 4 mmol/L (ref 3.5–5.1)
Sodium: 141 mEq/L (ref 135–145)
Sodium: 141 mmol/L (ref 135–145)
Total Bilirubin: 0.4 mg/dL (ref 0.2–1.2)
Total Bilirubin: 0.4 mg/dL (ref 0.3–1.2)
Total Protein: 6.6 g/dL (ref 6.0–8.3)
Total Protein: 6.3 g/dL — ABNORMAL LOW (ref 6.5–8.1)

## 2019-05-25 LAB — CBC WITH DIFFERENTIAL/PLATELET
Abs Immature Granulocytes: 0.02 10*3/uL (ref 0.00–0.07)
Basophils Absolute: 0.1 10*3/uL (ref 0.0–0.1)
Basophils Absolute: 0 10*3/uL (ref 0.0–0.1)
Basophils Relative: 1 % (ref 0.0–3.0)
Basophils Relative: 0 %
Eosinophils Absolute: 0.3 10*3/uL (ref 0.0–0.7)
Eosinophils Absolute: 0.2 10*3/uL (ref 0.0–0.5)
Eosinophils Relative: 3.1 % (ref 0.0–5.0)
Eosinophils Relative: 3 %
HCT: 21 % — CL (ref 36.0–46.0)
HCT: 21.9 % — ABNORMAL LOW (ref 36.0–46.0)
Hemoglobin: 6 g/dL — CL (ref 12.0–15.0)
Hemoglobin: 5.4 g/dL — CL (ref 12.0–15.0)
Immature Granulocytes: 0 %
Lymphocytes Relative: 29 % (ref 12.0–46.0)
Lymphocytes Relative: 31 %
Lymphs Abs: 2.5 10*3/uL (ref 0.7–4.0)
Lymphs Abs: 2.4 10*3/uL (ref 0.7–4.0)
MCH: 15.8 pg — ABNORMAL LOW (ref 26.0–34.0)
MCHC: 28.6 g/dL — ABNORMAL LOW (ref 30.0–36.0)
MCHC: 24.7 g/dL — ABNORMAL LOW (ref 30.0–36.0)
MCV: 57.3 fl — ABNORMAL LOW (ref 78.0–100.0)
MCV: 64 fL — ABNORMAL LOW (ref 80.0–100.0)
Monocytes Absolute: 0.7 10*3/uL (ref 0.1–1.0)
Monocytes Absolute: 0.7 10*3/uL (ref 0.1–1.0)
Monocytes Relative: 8 % (ref 3.0–12.0)
Monocytes Relative: 8 %
Neutro Abs: 5.1 10*3/uL (ref 1.4–7.7)
Neutro Abs: 4.4 10*3/uL (ref 1.7–7.7)
Neutrophils Relative %: 58.9 % (ref 43.0–77.0)
Neutrophils Relative %: 58 %
Platelets: 344 10*3/uL (ref 150.0–400.0)
Platelets: 320 10*3/uL (ref 150–400)
RBC: 3.67 Mil/uL — ABNORMAL LOW (ref 3.87–5.11)
RBC: 3.42 MIL/uL — ABNORMAL LOW (ref 3.87–5.11)
RDW: 19.1 % — ABNORMAL HIGH (ref 11.5–15.5)
RDW: 19.1 % — ABNORMAL HIGH (ref 11.5–15.5)
WBC: 8.7 10*3/uL (ref 4.0–10.5)
WBC: 7.8 10*3/uL (ref 4.0–10.5)
nRBC: 0.6 % — ABNORMAL HIGH (ref 0.0–0.2)

## 2019-05-25 LAB — TSH: TSH: 2.98 u[IU]/mL (ref 0.35–4.50)

## 2019-05-25 LAB — PROTIME-INR
INR: 1 (ref 0.8–1.2)
Prothrombin Time: 13.4 seconds (ref 11.4–15.2)

## 2019-05-25 LAB — GLUCOSE, CAPILLARY: Glucose-Capillary: 105 mg/dL — ABNORMAL HIGH (ref 70–99)

## 2019-05-25 LAB — PREPARE RBC (CROSSMATCH)

## 2019-05-25 LAB — HEMOGLOBIN A1C: Hgb A1c MFr Bld: 6.2 % (ref 4.6–6.5)

## 2019-05-25 LAB — POC OCCULT BLOOD, ED: Fecal Occult Bld: NEGATIVE

## 2019-05-25 LAB — ABO/RH: ABO/RH(D): O POS

## 2019-05-25 LAB — LIPASE, BLOOD: Lipase: 48 U/L (ref 11–51)

## 2019-05-25 LAB — SARS CORONAVIRUS 2 BY RT PCR (HOSPITAL ORDER, PERFORMED IN ~~LOC~~ HOSPITAL LAB): SARS Coronavirus 2: NEGATIVE

## 2019-05-25 MED ORDER — LORATADINE 10 MG PO TABS
10.0000 mg | ORAL_TABLET | Freq: Every day | ORAL | Status: DC
  Administered 2019-05-26: 10 mg via ORAL
  Filled 2019-05-25: qty 1

## 2019-05-25 MED ORDER — SODIUM CHLORIDE 0.9% FLUSH: 3.0000 mL | Freq: Two times a day (BID) | INTRAVENOUS | Status: DC

## 2019-05-25 MED ORDER — ACETAMINOPHEN 325 MG PO TABS: 650.0000 mg | ORAL_TABLET | Freq: Four times a day (QID) | ORAL | Status: DC | PRN

## 2019-05-25 MED ORDER — FENOFIBRIC ACID 135 MG PO CPDR: 1.0000 | DELAYED_RELEASE_CAPSULE | Freq: Every day | ORAL | Status: DC

## 2019-05-25 MED ORDER — SODIUM CHLORIDE 0.9 % IV SOLN
INTRAVENOUS | Status: DC
  Administered 2019-05-26: 01:00:00 via INTRAVENOUS

## 2019-05-25 MED ORDER — FENOFIBRATE 160 MG PO TABS
160.0000 mg | ORAL_TABLET | Freq: Every day | ORAL | Status: DC
  Administered 2019-05-26: 160 mg via ORAL
  Filled 2019-05-25: qty 1

## 2019-05-25 MED ORDER — ACETAMINOPHEN 650 MG RE SUPP: 650.0000 mg | Freq: Four times a day (QID) | RECTAL | Status: DC | PRN

## 2019-05-25 MED ORDER — SODIUM CHLORIDE 0.9 % IV SOLN: 10.0000 mL/h | Freq: Once | INTRAVENOUS | Status: DC

## 2019-05-25 MED ORDER — INSULIN ASPART 100 UNIT/ML ~~LOC~~ SOLN: 0.0000 [IU] | Freq: Three times a day (TID) | SUBCUTANEOUS | Status: DC

## 2019-05-25 MED ORDER — FLUTICASONE PROPIONATE 50 MCG/ACT NA SUSP: 2.0000 | Freq: Every day | NASAL | Status: DC

## 2019-05-25 MED ORDER — TRAZODONE HCL 50 MG PO TABS
25.0000 mg | ORAL_TABLET | Freq: Every evening | ORAL | Status: DC | PRN
  Administered 2019-05-26: 25 mg via ORAL
  Filled 2019-05-25: qty 1

## 2019-05-25 MED ORDER — ADULT MULTIVITAMIN W/MINERALS CH
1.0000 | ORAL_TABLET | Freq: Every day | ORAL | Status: DC
  Administered 2019-05-25 – 2019-05-26 (×2): 1 via ORAL
  Filled 2019-05-25 (×2): qty 1

## 2019-05-25 MED ORDER — ONDANSETRON HCL 4 MG/2ML IJ SOLN: 4.0000 mg | Freq: Four times a day (QID) | INTRAMUSCULAR | Status: DC | PRN

## 2019-05-25 MED ORDER — PANTOPRAZOLE SODIUM 40 MG IV SOLR
40.0000 mg | Freq: Every day | INTRAVENOUS | Status: DC
  Administered 2019-05-25: 40 mg via INTRAVENOUS
  Filled 2019-05-25: qty 40

## 2019-05-25 MED ORDER — ONDANSETRON HCL 4 MG PO TABS: 4.0000 mg | ORAL_TABLET | Freq: Four times a day (QID) | ORAL | Status: DC | PRN

## 2019-05-25 MED ORDER — OMEGA-3-ACID ETHYL ESTERS 1 G PO CAPS
1.0000 g | ORAL_CAPSULE | Freq: Every day | ORAL | Status: DC
  Administered 2019-05-26: 1 g via ORAL
  Filled 2019-05-25: qty 1

## 2019-05-25 MED ORDER — HYDROCODONE-ACETAMINOPHEN 5-325 MG PO TABS
1.0000 | ORAL_TABLET | ORAL | Status: DC | PRN
  Filled 2019-05-25: qty 1

## 2019-05-25 MED ORDER — METOPROLOL TARTRATE 25 MG PO TABS
50.0000 mg | ORAL_TABLET | Freq: Two times a day (BID) | ORAL | Status: DC
  Administered 2019-05-25 – 2019-05-26 (×2): 50 mg via ORAL
  Filled 2019-05-25 (×2): qty 2

## 2019-05-25 NOTE — ED Triage Notes (Signed)
Patient states she was called by her PCP and told she had an abnormal lab and needed to come to the ED. Hgb-6.0. Patient states her stools have been darker than usual, but no obvious bleeding noted. Patient c/o SOB that started in January and was at the PCP to get it checked.

## 2019-05-25 NOTE — H&P (Signed)
History and Physical    Virginia [name]:[id number] DOB: [birth year] DOA: 05/25/2019  PCP: Virginia Rail, MD  Patient coming from: pcp  I have personally briefly reviewed patient's old medical records in [place]  Chief Complaint: dyspnea  HPI: Virginia [name] is a 78 y.o. female with medical history significant for htn, oa, ckd, dysphagia and preDM who presents to the ER secondary to labs obtained by her PCP showing a hemoglobin of 5.4.  Patient reports over the last several months has become progressively more short of breath.  Report was associated with exertion, nothing makes it better rest will make it better.  She denied any hematuria any hematochezia any melena.  Denied any history of anemia.  She did reports a colonoscopy approximately 10 years ago with only THREE  polyps identified.  In the ER patient's was heme-negative, hemoglobin was 5.4 and patient was consulted to the hospital service for further eval and treatment.   ED Course: Hgb 5.4, hem negative, GFR >60 despite EMR reporting CKD.  Patient was typed and crossed in the ER with orders for transfusion of 2 units.  Review of Systems: As per HPI otherwise 10 point review of systems done notable for shortness of breath progressive over the last several months nothing makes it better exertion makes it worse all other reviewed and are negative Past Medical History:  Diagnosis Date  . ALLERGIC RHINITIS   . Allergy   . Cataract    beginning stage in lt. eye  . DIVERTICULITIS, HX OF   . FIBROIDS, UTERUS    s/p fibroidectomy  . GERD   . HYPERLIPIDEMIA    intol of statins (myalgia, thumb pain)  . HYPERTENSION   . OSTEOARTHRITIS   . Osteopenia 06/21/2014   DEXA@LB  06/14/14: -1.6  . 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 child birth  54 & 47  . COLONOSCOPY    . Fibroidectomy    . Low back surgery  01/1994   2 bulging disc  . POLYPECTOMY    .  TONSILLECTOMY  1968     reports that she has never smoked. She has never used smokeless tobacco. She reports that she does not drink alcohol or use drugs.  Allergies  Allergen Reactions  . Penicillins Hives  . Statins Other (See Comments)    pain in base of thumbs    Family History  Problem Relation Age of Onset  . Arthritis Mother   . Coronary artery disease Father   . Hyperlipidemia Brother   . Diabetes Maternal Aunt   . Hyperlipidemia Cousin   . Heart disease Cousin   . Diabetes Paternal Aunt   . Colon cancer Neg Hx   . Colon polyps Neg Hx   . Esophageal cancer Neg Hx   . Stomach cancer Neg Hx   . Rectal cancer Neg Hx      Prior to Admission medications   Medication Sig Start Date End Date Taking? Authorizing Provider  aspirin 81 MG tablet Take 81 mg by mouth daily.     Yes [provider]  Choline Fenofibrate (FENOFIBRIC ACID) 135 MG CPDR Take 1 tablet by mouth daily. 01/26/19  Yes Virginia [name], Virginia Lick, MD  loratadine (CLARITIN) 10 MG tablet Take 10 mg by mouth daily.   Yes [provider]  metoprolol tartrate (LOPRESSOR) 50 MG tablet TAKE 1 TABLET BY MOUTH TWICE A DAY 04/26/19  Yes Virginia [name], Virginia  J, MD  Multiple Vitamins-Minerals (AIRBORNE GUMMIES PO) Take 2 each by mouth daily.   Yes [provider]  Omega-3 Fatty Acids (FISH OIL) 1000 MG CAPS Take 1,000 mg by mouth daily. 2000 mg in the morning and 1000 mg at night   Yes [provider]  fluticasone (FLONASE) 50 MCG/ACT nasal spray Place 2 sprays into both nostrils daily. Patient not taking: Reported on 05/25/2019 05/05/19   Virginia Rail, MD    Physical Exam: Vitals:   05/25/19 1700 05/25/19 1747 05/25/19 1754 05/25/19 1800  BP:  (!) 151/72  (!) 155/64  Pulse: 71  76   Resp: 18 (!) 22  20  Temp:  98.4 F (36.9 C)    TempSrc:  Oral    SpO2: 97%  99%   Weight:      Height:        Constitutional: NAD, calm, comfortable Vitals:   05/25/19 1700 05/25/19 1747 05/25/19 1754 05/25/19  1800  BP:  (!) 151/72  (!) 155/64  Pulse: 71  76   Resp: 18 (!) 22  20  Temp:  98.4 F (36.9 C)    TempSrc:  Oral    SpO2: 97%  99%   Weight:      Height:       Eyes: PERRL, lids and conjunctivae normal ENMT: Mucous membranes are moist. Posterior pharynx clear of any exudate or lesions.Normal dentition.  Neck: normal, supple, no masses, no thyromegaly Respiratory: clear to auscultation bilaterally, no wheezing, no crackles. No accessory muscle use.  No evidence of respiratory distress Cardiovascular: Regular rate and rhythm, no murmurs / rubs / gallops. No extremity edema. 2+ pedal pulses. No carotid bruits.  Abdomen: no tenderness, no masses palpated. No hepatosplenomegaly. Bowel sounds positive.  Musculoskeletal: no clubbing / cyanosis. No joint deformity upper and lower extremities. Good ROM, no contractures. Normal muscle tone.  Skin: no rashes, lesions, ulcers. No induration Neurologic: CN 2-12 grossly intact. Sensation intact, DTR normal. Strength 5/5 in all 4.  Psychiatric: Normal judgment and insight. Alert and oriented x 3. Normal mood.    Labs on Admission: I have personally reviewed following labs and imaging studies  CBC: Recent Labs  Lab 05/24/19 1638 05/25/19 1552  WBC 8.7 7.8  NEUTROABS 5.1 4.4  HGB 6.0 Repeated and verified X2.* 5.4*  HCT 21.0 Repeated and verified X2.* 21.9*  MCV 57.3* 64.0*  PLT 344.0 742   Basic Metabolic Panel: Recent Labs  Lab 05/24/19 1638 05/25/19 1552  NA 141 141  K 4.7 4.0  CL 107 107  CO2 27 26  GLUCOSE 120* 101*  BUN 23 23  CREATININE 0.91 0.86  CALCIUM 9.9 9.7   GFR: Estimated Creatinine Clearance: 57.7 mL/min (by C-G formula based on SCr of 0.86 mg/dL). Liver Function Tests: Recent Labs  Lab 05/24/19 1638 05/25/19 1552  AST 9 12*  ALT 8 12  ALKPHOS 38* 37*  BILITOT 0.4 0.4  PROT 6.6 6.3*  ALBUMIN 4.0 3.4*   Recent Labs  Lab 05/25/19 1552  LIPASE 48   No results for input(s): AMMONIA in the last 168  hours. Coagulation Profile: Recent Labs  Lab 05/25/19 1552  INR 1.0   Cardiac Enzymes: No results for input(s): CKTOTAL, CKMB, CKMBINDEX, TROPONINI in the last 168 hours. BNP (last 3 results) No results for input(s): PROBNP in the last 8760 hours. HbA1C: Recent Labs    05/24/19 1638  HGBA1C 6.2   CBG: No results for input(s): GLUCAP in the last 168  hours. Lipid Profile: No results for input(s): CHOL, HDL, LDLCALC, TRIG, CHOLHDL, LDLDIRECT in the last 72 hours. Thyroid Function Tests: Recent Labs    05/24/19 1638  TSH 2.98   Anemia Panel: No results for input(s): VITAMINB12, FOLATE, FERRITIN, TIBC, IRON, RETICCTPCT in the last 72 hours. Urine analysis:    Component Value Date/Time   COLORURINE YELLOW 06/14/2014 0927   APPEARANCEUR CLEAR 06/14/2014 0927   LABSPEC 1.010 06/14/2014 0927   PHURINE 5.5 06/14/2014 0927   GLUCOSEU NEGATIVE 06/14/2014 0927   HGBUR NEGATIVE 06/14/2014 0927   BILIRUBINUR NEGATIVE 06/14/2014 0927   KETONESUR NEGATIVE 06/14/2014 0927   PROTEINUR NEG 11/09/2013 1508   UROBILINOGEN 0.2 06/14/2014 0927   NITRITE NEGATIVE 06/14/2014 0927   LEUKOCYTESUR MODERATE (A) 06/14/2014 0927    Radiological Exams on Admission: Dg Chest 2 View  Result Date: 05/25/2019 CLINICAL DATA:  Shortness of breath.  Recent toe injury. EXAM: CHEST - 2 VIEW COMPARISON:  None. FINDINGS: Heart size is mildly enlarged. Lungs are clear. No pulmonary edema. There may be mild curvature in the thoracolumbar spine. No large pleural effusions. IMPRESSION: No active cardiopulmonary disease. Heart size is mildly prominent. Electronically Signed   By: Markus Daft M.D.   On: 05/25/2019 17:46   Dg Toe 5th Right  Result Date: 05/25/2019 CLINICAL DATA:  Patient stubbed her small toe last week. The 4th and 5th toes are currently taped together. EXAM: RIGHT FIFTH TOE COMPARISON:  None. FINDINGS: The bones appear mildly demineralized. No displaced acute fracture or dislocation is identified.  On the PA view, there is mild irregularity of the proximal 5th phalangeal cortex laterally which could reflect a nondisplaced fracture. There are mild degenerative changes in the small and 4th toes. There is mild soft tissue swelling in the small toe. No foreign bodies. IMPRESSION: Possible nondisplaced extra-articular fracture of the 5th proximal phalanx. Electronically Signed   By: Richardean Sale M.D.   On: 05/25/2019 17:48    EKG: Independently reviewed.  I personally reviewed EKG showed normal sinus rhythm no acute ST changes.  Assessment/Plan Active Problems:   Acute on chronic anemia Hypertension CKD Severe arthritis Prediabetic  Acute on chronic anemia.  Patient was heme-negative we will recheck another sample, transfuse 2u PRBC, check reticulocytes, LDH, haptoglobin, ferritin iron and TIBC.  Consider GI in a.m., add PPI  Hypertension continue patient's home blood pressure medications blood pressure reasonably well controlled at 150s over 70s currently in the ER  CKD.  EMR report history of CKD patient's BUN and creatinine are normal with a GFR greater than 50, will continue to monitor no acute intervention  Arthritis, continue patient's home medications, PRN analgesics as needed.  Prediabetic.  Will check hemoglobin A1c, check blood glucose AC at bedtime    DVT prophylaxis: SCD/Compression stockings  Code Status: FULL    Code Status Orders  (From admission, onward)         Start     Ordered   05/25/19 1753  Full code  Continuous     05/25/19 1754        Code Status History    This patient has a current code status but no historical code status.   Advance Care Planning Activity    Advance Directive Documentation     Most Recent Value  Type of Advance Directive  Healthcare Power of Attorney, Living will  Pre-existing out of facility DNR order (yellow form or pink MOST form)  -  "MOST" Form in Place?  -  Family Communication: NONE AT BEDSIDE  Disposition  Plan:   Patient will be admitted observation Consults called: None Admission status: Observation   Nicolette Bang MD [facility]   If 7PM-7AM, please contact night-coverage   05/25/2019, 6:18 PM

## 2019-05-25 NOTE — Telephone Encounter (Signed)
Pt aware of response. Going to the hospital per providers request.

## 2019-05-25 NOTE — ED Provider Notes (Signed)
[facility] DEPT Provider Note   CSN: [id number] Arrival date & time: 05/25/19  1413     History   Chief Complaint Chief Complaint  Patient presents with  . Abnormal Lab    HPI Virginia [name] is a 78 y.o. female.  She was sent in by her primary care doctor after she had lab work done yesterday that showed her to be very anemic.  In retrospect she said she is felt short of breath and fatigued since January but she has been caring for her husband who just passed away last week.  She is also noticed a whooshing sound in her right ear that she seen ENT for and they have not found a reason for that.  She also injured her right little toe a few days ago when she struck it on a table and it was displaced and she taped it back into position.  She has never required a blood transfusion before.  She has not noticed any obvious bleeding although she thinks her stools might be darker but not black.  She has been taking some ibuprofen since her toe injury but she does not chronically use it.     The history is provided by the patient.  Shortness of Breath Severity:  Moderate Onset quality:  Gradual Timing:  Intermittent Progression:  Worsening Chronicity:  New Context: activity   Relieved by:  Rest Worsened by:  Activity Ineffective treatments:  None tried Associated symptoms: no abdominal pain, no chest pain, no cough, no diaphoresis, no fever, no headaches, no hemoptysis, no rash, no sore throat, no sputum production, no syncope, no vomiting and no wheezing   Risk factors: no recent alcohol use and no tobacco use     Past Medical History:  Diagnosis Date  . ALLERGIC RHINITIS   . Allergy   . Cataract    beginning stage in lt. eye  . DIVERTICULITIS, HX OF   . FIBROIDS, UTERUS    s/p fibroidectomy  . GERD   . HYPERLIPIDEMIA    intol of statins (myalgia, thumb pain)  . HYPERTENSION   . OSTEOARTHRITIS   . Osteopenia 06/21/2014   DEXA@LB  06/14/14: -1.6   . 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 DOE (dyspnea on exertion) 05/24/2019  . Tinnitus 05/24/2019  . Pain of toe of right foot 05/24/2019  . Caregiver stress 07/21/2018  . Left foot pain 05/13/2017  . Prediabetes 04/19/2016  . Constipation 04/16/2016  . Bilateral thumb pain 04/16/2016  . Dysphagia 10/17/2015  . Chronic kidney disease 08/13/2015  . Osteopenia 06/21/2014  . Back pain   . Sciatica of right side 05/12/2011  . 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 OSTEOARTHRITIS 01/01/2010  . FIBROIDS, UTERUS 12/13/2009  . Hyperlipidemia 12/13/2009  . Essential hypertension 12/13/2009  . ALLERGIC RHINITIS 12/13/2009  . COLONIC POLYPS, HX OF 12/13/2009    Past Surgical History:  Procedure Laterality Date  . child birth  56 & 17  . COLONOSCOPY    . Fibroidectomy    . Low back surgery  01/1994   2 bulging disc  . POLYPECTOMY    . TONSILLECTOMY  1968     OB History   No obstetric history on file.      Home Medications    Prior to Admission medications   Medication Sig Start Date End Date Taking? Authorizing Provider  aspirin 81 MG tablet Take 81 mg by  mouth daily.      [provider]  Choline Fenofibrate (FENOFIBRIC ACID) 135 MG CPDR Take 1 tablet by mouth daily. 01/26/19   [name], MD  fluticasone (FLONASE) 50 MCG/ACT nasal spray Place 2 sprays into both nostrils daily. 05/05/19   [name], MD  loratadine (CLARITIN) 10 MG tablet Take 10 mg by mouth daily.    [provider]  metoprolol tartrate (LOPRESSOR) 50 MG tablet TAKE 1 TABLET BY MOUTH TWICE A DAY 04/26/19   Burns, Claudina Lick, MD  Omega-3 Fatty Acids (FISH OIL) 1000 MG CAPS Take 1,000-2,000 mg by mouth 2 (two) times daily. 2000 mg in the morning and 1000 mg at night    [provider]    Family History Family History  Problem Relation Age of Onset  . Arthritis Mother   . Coronary artery disease Father   .  Hyperlipidemia Brother   . Diabetes Maternal Aunt   . Hyperlipidemia Cousin   . Heart disease Cousin   . Diabetes Paternal Aunt   . Colon cancer Neg Hx   . Colon polyps Neg Hx   . Esophageal cancer Neg Hx   . Stomach cancer Neg Hx   . Rectal cancer Neg Hx     Social History Social History   Tobacco Use  . Smoking status: Never Smoker  . Smokeless tobacco: Never Used  . 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 Alcohol use: No    Alcohol/week: 0.0 standard drinks  . Drug use: No     Allergies   Penicillins and Statins   Review of Systems Review of Systems  Constitutional: Positive for fatigue. Negative for diaphoresis and fever.  HENT: Positive for tinnitus. Negative for sore throat.   Eyes: Negative for visual disturbance.  Respiratory: Positive for shortness of breath. Negative for cough, hemoptysis, sputum production and wheezing.   Cardiovascular: Negative for chest pain and syncope.  Gastrointestinal: Negative for abdominal pain and vomiting.  Genitourinary: Negative for dysuria.  Musculoskeletal: Negative for back pain.  Skin: Negative for rash.  Neurological: Negative for headaches.     Physical Exam Updated Vital Signs BP (!) 154/60 (BP Location: Right Arm)   Pulse 79   Temp 98.1 F (36.7 C) (Oral)   Resp 20   Ht 5\' 4"  (1.626 m)   Wt 82.1 kg   SpO2 99%   BMI 31.07 kg/m   Physical Exam Vitals signs and nursing note reviewed.  Constitutional:      General: She is not in acute distress.    Appearance: She is well-developed.  HENT:     Head: Normocephalic and atraumatic.     Right Ear: Tympanic membrane normal.     Left Ear: Tympanic membrane normal.     Mouth/Throat:     Mouth: Mucous membranes are moist.  Eyes:     Conjunctiva/sclera: Conjunctivae normal.  Neck:     Musculoskeletal: Neck supple.  Cardiovascular:     Rate and Rhythm: Normal rate and regular rhythm.     Heart sounds:  No murmur.  Pulmonary:     Effort: Pulmonary effort is normal. No respiratory distress.     Breath sounds: Normal breath sounds.  Abdominal:     Palpations: Abdomen is soft.     Tenderness: There is no abdominal tenderness.  Musculoskeletal: Normal range of motion.     Right lower leg: No edema.     Left lower leg:  No edema.  Skin:    General: Skin is warm and dry.  Neurological:     General: No focal deficit present.     Mental Status: She is alert and oriented to person, place, and time.     Sensory: No sensory deficit.     Motor: No weakness.      ED Treatments / Results  Labs (all labs ordered are listed, but only abnormal results are displayed) Labs Reviewed  COMPREHENSIVE METABOLIC PANEL - Abnormal; Notable for the following components:      Result Value   Glucose, Bld 101 (*)    Total Protein 6.3 (*)    Albumin 3.4 (*)    AST 12 (*)    Alkaline Phosphatase 37 (*)    All other components within normal limits  CBC WITH DIFFERENTIAL/PLATELET - Abnormal; Notable for the following components:   RBC 3.42 (*)    Hemoglobin 5.4 (*)    HCT 21.9 (*)    MCV 64.0 (*)    MCH 15.8 (*)    MCHC 24.7 (*)    RDW 19.1 (*)    nRBC 0.6 (*)    All other components within normal limits  GLUCOSE, CAPILLARY - Abnormal; Notable for the following components:   Glucose-Capillary 105 (*)    All other components within normal limits  SARS CORONAVIRUS 2 (HOSPITAL ORDER, [place] [facility])  LIPASE, BLOOD  PROTIME-INR  BASIC METABOLIC PANEL  CBC  IRON AND TIBC  FERRITIN  RETICULOCYTES  HAPTOGLOBIN  LACTATE DEHYDROGENASE  HEMOGLOBIN A1C  POC OCCULT BLOOD, ED  TYPE AND SCREEN  ABO/RH  PREPARE RBC (CROSSMATCH)    EKG EKG Interpretation  Date/Time:  Wednesday May 25 2019 14:43:49 EDT Ventricular Rate:  76 PR Interval:    QRS Duration: 86 QT Interval:  368 QTC Calculation: 414 R Axis:   29 Text Interpretation:  Sinus rhythm Borderline T abnormalities,  anterior leads No STEMI  Confirmed by Nanda Quinton [id number]) on 05/25/2019 2:50:44 PM   Radiology Dg Chest 2 View  Result Date: 05/25/2019 CLINICAL DATA:  Shortness of breath.  Recent toe injury. EXAM: CHEST - 2 VIEW COMPARISON:  None. FINDINGS: Heart size is mildly enlarged. Lungs are clear. No pulmonary edema. There may be mild curvature in the thoracolumbar spine. No large pleural effusions. IMPRESSION: No active cardiopulmonary disease. Heart size is mildly prominent. Electronically Signed   By: Markus Daft M.D.   On: 05/25/2019 17:46   Dg Toe 5th Right  Result Date: 05/25/2019 CLINICAL DATA:  Patient stubbed her small toe last week. The 4th and 5th toes are currently taped together. EXAM: RIGHT FIFTH TOE COMPARISON:  None. FINDINGS: The bones appear mildly demineralized. No displaced acute fracture or dislocation is identified. On the PA view, there is mild irregularity of the proximal 5th phalangeal cortex laterally which could reflect a nondisplaced fracture. There are mild degenerative changes in the small and 4th toes. There is mild soft tissue swelling in the small toe. No foreign bodies. IMPRESSION: Possible nondisplaced extra-articular fracture of the 5th proximal phalanx. Electronically Signed   By: Richardean Sale M.D.   On: 05/25/2019 17:48    Procedures .Critical Care Performed by: Hayden Rasmussen, MD Authorized by: Hayden Rasmussen, MD   Critical care provider statement:    Critical care time (minutes):  45   Critical care time was exclusive of:  Separately billable procedures and treating other patients   Critical care was necessary to treat or  prevent imminent or life-threatening deterioration of the following conditions:  Circulatory failure   Critical care was time spent personally by me on the following activities:  Evaluation of patient's response to treatment, examination of patient, ordering and performing treatments and interventions, ordering and review of laboratory  studies, ordering and review of radiographic studies, pulse oximetry, re-evaluation of patient's condition, obtaining history from patient or surrogate, review of old charts, development of treatment plan with patient or surrogate and discussions with consultants   I assumed direction of critical care for this patient from another provider in my specialty: no     (including critical care time)  Medications Ordered in ED Medications  0.9 %  sodium chloride infusion (has no administration in time range)  metoprolol tartrate (LOPRESSOR) tablet 50 mg (50 mg Oral Given 05/25/19 2139)  omega-3 acid ethyl esters (LOVAZA) capsule 1 g (has no administration in time range)  loratadine (CLARITIN) tablet 10 mg (has no administration in time range)  sodium chloride flush (NS) 0.9 % injection 3 mL (has no administration in time range)  0.9 %  sodium chloride infusion (has no administration in time range)  acetaminophen (TYLENOL) tablet 650 mg (has no administration in time range)    Or  acetaminophen (TYLENOL) suppository 650 mg (has no administration in time range)  HYDROcodone-acetaminophen (NORCO/VICODIN) 5-325 MG per tablet 1-2 tablet (has no administration in time range)  traZODone (DESYREL) tablet 25 mg (has no administration in time range)  ondansetron (ZOFRAN) tablet 4 mg (has no administration in time range)    Or  ondansetron (ZOFRAN) injection 4 mg (has no administration in time range)  multivitamin with minerals tablet 1 tablet (1 tablet Oral Given 05/25/19 2139)  insulin aspart (novoLOG) injection 0-9 Units (has no administration in time range)  pantoprazole (PROTONIX) injection 40 mg (has no administration in time range)  fenofibrate tablet 160 mg (has no administration in time range)     Initial Impression / Assessment and Plan / ED Course  I have reviewed the triage vital signs and the nursing notes.  Pertinent labs & imaging results that were available during my care of the patient  were reviewed by me and considered in my medical decision making (see chart for details).  Clinical Course as of May 24 2138  Wed May 24, 6334  2665 78 year old female here with progressive shortness of breath over months and a critical low hemoglobin noted by her PCP on blood work done yesterday.  Differential diagnosis includes lab error, pneumonia, anemia, GI bleed, ACS   [MB]  1603 Rectal exam done with nurse as chaperone.  Normal tone no masses.  Sample sent for guaiac.   [MB]    Clinical Course User Index [MB] Hayden Rasmussen, MD   Virginia [name] was evaluated in Emergency Department on 05/25/2019 for the symptoms described in the history of present illness. She was evaluated in the context of the global COVID-19 pandemic, which necessitated consideration that the patient might be at risk for infection with the SARS-CoV-2 virus that causes COVID-19. Institutional protocols and algorithms that pertain to the evaluation of patients at risk for COVID-19 are in a state of rapid change based on information released by regulatory bodies including the CDC and federal and state organizations. These policies and algorithms were followed during the patient's care in the ED.      Final Clinical Impressions(s) / ED Diagnoses   Final diagnoses:  Symptomatic anemia  SOB (shortness of breath)  Closed nondisplaced fracture  of proximal phalanx of lesser toe of right foot, initial encounter    ED Discharge Orders    None       Hayden Rasmussen, MD 05/25/19 2142

## 2019-05-25 NOTE — Telephone Encounter (Signed)
Please call patient -   Kidney function, liver tests, thyroid function normal.  Sugars in prediabetic range.   She is very anemic which is likely the cause of her shortness of breath.  The concern is she may be bleeding from her stomach or intestines.  Has she had any blood in her stool or black stools?    Because her levels are so low she probably needs a transfusion and further evaluation in the hospital, which I recommend.

## 2019-05-25 NOTE — ED Notes (Signed)
Bed: WA21 Expected date:  Expected time:  Means of arrival:  Comments: Hold for [employer]

## 2019-05-25 NOTE — Telephone Encounter (Signed)
Lab called to report critical levels:  Hemoglobin - 6  Hematocrit - 21.0  They are faxing these over as well.

## 2019-05-25 NOTE — ED Notes (Signed)
ED TO INPATIENT HANDOFF REPORT  ED Nurse Name and Phone #: [place] G, [phone]  S Name/Age/Gender Virginia [name] 78 y.o. female Room/Bed: WA21/WA21  Code Status   Code Status: Full Code  Home/SNF/Other Home Patient oriented to: self, place, time and situation Is this baseline? Yes   Triage Complete: Triage complete  Chief Complaint transfusion  Triage Note Patient states she was called by her PCP and told she had an abnormal lab and needed to come to the ED. Hgb-6.0. Patient states her stools have been darker than usual, but no obvious bleeding noted. Patient c/o SOB that started in January and was at the PCP to get it checked.   Allergies Allergies  Allergen Reactions  . Penicillins Hives  . Statins Other (See Comments)    pain in base of thumbs    Level of Care/Admitting Diagnosis ED Disposition    ED Disposition Condition [facility]: [place] [[id number]]  Level of Care: Telemetry [5]  Admit to tele based on following criteria: Other see comments  Comments: hgb 5.4 getting transfused  Covid Evaluation: Confirmed COVID Negative  Diagnosis: Acute on chronic anemia [1245809]  Admitting Physician: Marcell Anger [[id number]]  Attending Physician: Marcell Anger [[id number]]  PT Class (Do Not Modify): Observation [104]  PT Acc Code (Do Not Modify): Observation [10022]       B Medical/Surgery History Past Medical History:  Diagnosis Date  . ALLERGIC RHINITIS   . Allergy   . Cataract    beginning stage in lt. eye  . DIVERTICULITIS, HX OF   . FIBROIDS, UTERUS    s/p fibroidectomy  . GERD   . HYPERLIPIDEMIA    intol of statins (myalgia, thumb pain)  . HYPERTENSION   . OSTEOARTHRITIS   . Osteopenia 06/21/2014   DEXA@LB  06/14/14: -1.6  . 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 child birth  47 & 49  . COLONOSCOPY    . Fibroidectomy    .  Low back surgery  01/1994   2 bulging disc  . POLYPECTOMY    . TONSILLECTOMY  1968     A IV Location/Drains/Wounds Patient Lines/Drains/Airways Status   Active Line/Drains/Airways    Name:   Placement date:   Placement time:   Site:   Days:   Peripheral IV 05/25/19 Left Forearm   05/25/19    1549    Forearm   less than 1          Intake/Output Last 24 hours  Intake/Output Summary (Last 24 hours) at 05/25/2019 2006 Last data filed at 05/25/2019 1821 Gross per 24 hour  Intake 30 ml  Output -  Net 30 ml    Labs/Imaging Results for orders placed or performed during the hospital encounter of 05/25/19 (from the past 48 hour(s))  ABO/Rh     Status: None   Collection Time: 05/25/19  3:17 PM  Result Value Ref Range   ABO/RH(D)      O POS Performed at [facility], [place] [address], [place], [place] [address]   Comprehensive metabolic panel     Status: Abnormal   Collection Time: 05/25/19  3:52 PM  Result Value Ref Range   Sodium 141 135 - 145 mmol/L   Potassium 4.0 3.5 - 5.1 mmol/L   Chloride 107 98 - 111 mmol/L   CO2 26 22 - 32 mmol/L   Glucose, Bld  101 (H) 70 - 99 mg/dL   BUN 23 8 - 23 mg/dL   Creatinine, Ser 0.86 0.44 - 1.00 mg/dL   Calcium 9.7 8.9 - 10.3 mg/dL   Total Protein 6.3 (L) 6.5 - 8.1 g/dL   Albumin 3.4 (L) 3.5 - 5.0 g/dL   AST 12 (L) 15 - 41 U/L   ALT 12 0 - 44 U/L   Alkaline Phosphatase 37 (L) 38 - 126 U/L   Total Bilirubin 0.4 0.3 - 1.2 mg/dL   GFR calc non Af Amer >60 >60 mL/min   GFR calc Af Amer >60 >60 mL/min   Anion gap 8 5 - 15    Comment: Performed at [facility], [place] [address], [place], [place] [address]  Lipase, blood     Status: None   Collection Time: 05/25/19  3:52 PM  Result Value Ref Range   Lipase 48 11 - 51 U/L    Comment: Performed at [facility], [place] [address], [place], [place] [address]  CBC with Differential     Status: Abnormal   Collection Time: 05/25/19  3:52 PM   Result Value Ref Range   WBC 7.8 4.0 - 10.5 K/uL   RBC 3.42 (L) 3.87 - 5.11 MIL/uL   Hemoglobin 5.4 (LL) 12.0 - 15.0 g/dL    Comment: REPEATED TO VERIFY Reticulocyte Hemoglobin testing may be clinically indicated, consider ordering this additional test [id number] THIS CRITICAL RESULT HAS VERIFIED AND BEEN CALLED TO [place] BY NATHAN THOMPSON ON 06 24 2020 AT 1643, AND HAS BEEN READ BACK. CRITICAL RESULT VERIFIED    HCT 21.9 (L) 36.0 - 46.0 %   MCV 64.0 (L) 80.0 - 100.0 fL   MCH 15.8 (L) 26.0 - 34.0 pg   MCHC 24.7 (L) 30.0 - 36.0 g/dL   RDW 19.1 (H) 11.5 - 15.5 %   Platelets 320 150 - 400 K/uL   nRBC 0.6 (H) 0.0 - 0.2 %   Neutrophils Relative % 58 %   Neutro Abs 4.4 1.7 - 7.7 K/uL   Lymphocytes Relative 31 %   Lymphs Abs 2.4 0.7 - 4.0 K/uL   Monocytes Relative 8 %   Monocytes Absolute 0.7 0.1 - 1.0 K/uL   Eosinophils Relative 3 %   Eosinophils Absolute 0.2 0.0 - 0.5 K/uL   Basophils Relative 0 %   Basophils Absolute 0.0 0.0 - 0.1 K/uL   Immature Granulocytes 0 %   Abs Immature Granulocytes 0.02 0.00 - 0.07 K/uL   Polychromasia PRESENT    Ovalocytes PRESENT     Comment: Performed at [facility], [place] [address] [place], [place] [address]  Protime-INR     Status: None   Collection Time: 05/25/19  3:52 PM  Result Value Ref Range   Prothrombin Time 13.4 11.4 - 15.2 seconds   INR 1.0 0.8 - 1.2    Comment: (NOTE) INR goal varies based on device and disease states. Performed at [facility], [place] [address], [place],  [address]   Type and screen [employer]     Status: None (Preliminary result)   Collection Time: 05/25/19  3:52 PM  Result Value Ref Range   ABO/RH(D) O POS    Antibody Screen NEG    Sample Expiration 05/28/2019,2359    Unit Number [id number]    Blood Component Type RED CELLS,LR    Unit division 00    Status of Unit ISSUED    Transfusion Status OK  TO TRANSFUSE    Crossmatch Result       Compatible Performed at [facility] [address], [place], [place] [address]    Unit Number [id number]    Blood Component Type RED CELLS,LR    Unit division 00    Status of Unit ALLOCATED    Transfusion Status OK TO TRANSFUSE    Crossmatch Result Compatible   SARS Coronavirus 2 (CEPHEID - Performed in [place] hospital lab), Hosp Order     Status: None   Collection Time: 05/25/19  4:01 PM   Specimen: Nasopharyngeal Swab  Result Value Ref Range   SARS Coronavirus 2 NEGATIVE NEGATIVE    Comment: (NOTE) If result is NEGATIVE SARS-CoV-2 target nucleic acids are NOT DETECTED. The SARS-CoV-2 RNA is generally detectable in upper and lower  respiratory specimens during the acute phase of infection. The lowest  concentration of SARS-CoV-2 viral copies this assay can detect is 250  copies / mL. A negative result does not preclude SARS-CoV-2 infection  and should not be used as the sole basis for treatment or other  patient management decisions.  A negative result may occur with  improper specimen collection / handling, submission of specimen other  than nasopharyngeal swab, presence of viral mutation(s) within the  areas targeted by this assay, and inadequate number of viral copies  (<250 copies / mL). A negative result must be combined with clinical  observations, patient history, and epidemiological information. If result is POSITIVE SARS-CoV-2 target nucleic acids are DETECTED. The SARS-CoV-2 RNA is generally detectable in upper and lower  respiratory specimens dur ing the acute phase of infection.  Positive  results are indicative of active infection with SARS-CoV-2.  Clinical  correlation with patient history and other diagnostic information is  necessary to determine patient infection status.  Positive results do  not rule out bacterial infection or co-infection with other viruses. If result is PRESUMPTIVE POSTIVE SARS-CoV-2 nucleic acids MAY BE PRESENT.    A presumptive positive result was obtained on the submitted specimen  and confirmed on repeat testing.  While 2019 novel coronavirus  (SARS-CoV-2) nucleic acids may be present in the submitted sample  additional confirmatory testing may be necessary for epidemiological  and / or clinical management purposes  to differentiate between  SARS-CoV-2 and other Sarbecovirus currently known to infect humans.  If clinically indicated additional testing with an alternate test  methodology [phone]) is advised. The SARS-CoV-2 RNA is generally  detectable in upper and lower respiratory sp ecimens during the acute  phase of infection. The expected result is Negative. Fact Sheet for Patients:  StrictlyIdeas.no Fact Sheet for Healthcare Providers: BankingDealers.co.za This test is not yet approved or cleared by the [place] FDA and has been authorized for detection and/or diagnosis of SARS-CoV-2 by FDA under an Emergency Use Authorization (EUA).  This EUA will remain in effect (meaning this test can be used) for the duration of the COVID-19 declaration under Section 564(b)(1) of the Act, 21 U.S.C. section 360bbb-3(b)(1), unless the authorization is terminated or revoked sooner. Performed at [facility], [place] [address], [place], [place] [address]   POC occult blood, ED     Status: None   Collection Time: 05/25/19  4:05 PM  Result Value Ref Range   Fecal Occult Bld NEGATIVE NEGATIVE  Prepare RBC     Status: None   Collection Time: 05/25/19  5:30 PM  Result Value Ref Range   Order Confirmation  ORDER PROCESSED BY BLOOD BANK Performed at [facility], [place] [address], [place], [place] [address]    Dg Chest 2 View  Result Date: 05/25/2019 CLINICAL DATA:  Shortness of breath.  Recent toe injury. EXAM: CHEST - 2 VIEW COMPARISON:  None. FINDINGS: Heart size is mildly enlarged. Lungs are clear. No pulmonary  edema. There may be mild curvature in the thoracolumbar spine. No large pleural effusions. IMPRESSION: No active cardiopulmonary disease. Heart size is mildly prominent. Electronically Signed   By: Markus Daft M.D.   On: 05/25/2019 17:46   Dg Toe 5th Right  Result Date: 05/25/2019 CLINICAL DATA:  Patient stubbed her small toe last week. The 4th and 5th toes are currently taped together. EXAM: RIGHT FIFTH TOE COMPARISON:  None. FINDINGS: The bones appear mildly demineralized. No displaced acute fracture or dislocation is identified. On the PA view, there is mild irregularity of the proximal 5th phalangeal cortex laterally which could reflect a nondisplaced fracture. There are mild degenerative changes in the small and 4th toes. There is mild soft tissue swelling in the small toe. No foreign bodies. IMPRESSION: Possible nondisplaced extra-articular fracture of the 5th proximal phalanx. Electronically Signed   By: Richardean Sale M.D.   On: 05/25/2019 17:48    Pending Labs Unresulted Labs (From admission, onward)    Start     Ordered   05/26/19 0762  Basic metabolic panel  Tomorrow morning,   R     05/25/19 1754   05/26/19 0500  CBC  Tomorrow morning,   R     05/25/19 1754   05/26/19 0500  Hemoglobin A1c  Tomorrow morning,   R    Comments: To assess prior glycemic control    05/25/19 1829   05/25/19 1818  Reticulocytes  Once,   STAT     05/25/19 1817   05/25/19 1818  Haptoglobin  Once,   STAT     05/25/19 1817   05/25/19 1818  Lactate dehydrogenase  Once,   STAT     05/25/19 1817   05/25/19 1817  Iron and TIBC  Once,   STAT     05/25/19 1817   05/25/19 1817  Ferritin  Once,   STAT     05/25/19 1817   Unscheduled  Occult blood card to lab, stool  As needed,   R     05/25/19 1755          Vitals/Pain Today's Vitals   05/25/19 1754 05/25/19 1800 05/25/19 1811 05/25/19 1821  BP:  (!) 155/64  (!) 142/62  Pulse: 76   70  Resp:  20  16  Temp:    98.4 F (36.9 C)  TempSrc:    Oral   SpO2: 99%   98%  Weight:      Height:      PainSc:   0-No pain     Isolation Precautions No active isolations  Medications Medications  0.9 %  sodium chloride infusion (has no administration in time range)  fluticasone (FLONASE) 50 MCG/ACT nasal spray 2 spray (has no administration in time range)  Fenofibric Acid CPDR 1 tablet (has no administration in time range)  metoprolol tartrate (LOPRESSOR) tablet 50 mg (has no administration in time range)  omega-3 acid ethyl esters (LOVAZA) capsule 1 g (has no administration in time range)  loratadine (CLARITIN) tablet 10 mg (has no administration in time range)  sodium chloride flush (NS) 0.9 % injection 3 mL (has no administration in time range)  0.9 %  sodium chloride infusion (has no administration in time range)  acetaminophen (TYLENOL) tablet 650 mg (has no administration in time range)    Or  acetaminophen (TYLENOL) suppository 650 mg (has no administration in time range)  HYDROcodone-acetaminophen (NORCO/VICODIN) 5-325 MG per tablet 1-2 tablet (has no administration in time range)  traZODone (DESYREL) tablet 25 mg (has no administration in time range)  ondansetron (ZOFRAN) tablet 4 mg (has no administration in time range)    Or  ondansetron (ZOFRAN) injection 4 mg (has no administration in time range)  multivitamin with minerals tablet 1 tablet (has no administration in time range)  insulin aspart (novoLOG) injection 0-9 Units (has no administration in time range)  pantoprazole (PROTONIX) injection 40 mg (has no administration in time range)    Mobility walks Low fall risk

## 2019-05-26 ENCOUNTER — Observation Stay (HOSPITAL_BASED_OUTPATIENT_CLINIC_OR_DEPARTMENT_OTHER): Payer: Medicare Other

## 2019-05-26 DIAGNOSIS — I340 Nonrheumatic mitral (valve) insufficiency: Secondary | ICD-10-CM | POA: Diagnosis not present

## 2019-05-26 DIAGNOSIS — I10 Essential (primary) hypertension: Secondary | ICD-10-CM | POA: Diagnosis not present

## 2019-05-26 DIAGNOSIS — N189 Chronic kidney disease, unspecified: Secondary | ICD-10-CM | POA: Diagnosis not present

## 2019-05-26 DIAGNOSIS — D649 Anemia, unspecified: Secondary | ICD-10-CM | POA: Diagnosis not present

## 2019-05-26 DIAGNOSIS — M1990 Unspecified osteoarthritis, unspecified site: Secondary | ICD-10-CM | POA: Diagnosis not present

## 2019-05-26 LAB — BPAM RBC
Blood Product Expiration Date: 202007212359
Blood Product Expiration Date: 202007212359
ISSUE DATE / TIME: 202006241759
ISSUE DATE / TIME: 202006242112
Unit Type and Rh: 5100
Unit Type and Rh: 5100

## 2019-05-26 LAB — FERRITIN: Ferritin: 3 ng/mL — ABNORMAL LOW (ref 11–307)

## 2019-05-26 LAB — LACTATE DEHYDROGENASE: LDH: 114 U/L (ref 98–192)

## 2019-05-26 LAB — RETICULOCYTES
Immature Retic Fract: 42.3 % — ABNORMAL HIGH (ref 2.3–15.9)
RBC.: 4.04 MIL/uL (ref 3.87–5.11)
Retic Count, Absolute: 71.9 10*3/uL (ref 19.0–186.0)
Retic Ct Pct: 1.8 % (ref 0.4–3.1)

## 2019-05-26 LAB — HEMOGLOBIN A1C
Hgb A1c MFr Bld: 5.4 % (ref 4.8–5.6)
Mean Plasma Glucose: 108.28 mg/dL

## 2019-05-26 LAB — BASIC METABOLIC PANEL
Anion gap: 6 (ref 5–15)
BUN: 16 mg/dL (ref 8–23)
CO2: 25 mmol/L (ref 22–32)
Calcium: 9.3 mg/dL (ref 8.9–10.3)
Chloride: 109 mmol/L (ref 98–111)
Creatinine, Ser: 0.84 mg/dL (ref 0.44–1.00)
GFR calc Af Amer: >60 mL/min (ref >60)
GFR calc non Af Amer: >60 mL/min (ref >60)
Glucose, Bld: 105 mg/dL — ABNORMAL HIGH (ref 70–99)
Potassium: 3.7 mmol/L (ref 3.5–5.1)
Sodium: 140 mmol/L (ref 135–145)

## 2019-05-26 LAB — ECHOCARDIOGRAM COMPLETE
Height: 64 in
Weight: 2906.54 oz

## 2019-05-26 LAB — TYPE AND SCREEN
ABO/RH(D): O POS
Antibody Screen: NEGATIVE
Unit division: 0
Unit division: 0

## 2019-05-26 LAB — CBC
HCT: 27.4 % — ABNORMAL LOW (ref 36.0–46.0)
Hemoglobin: 7.4 g/dL — ABNORMAL LOW (ref 12.0–15.0)
MCH: 19.1 pg — ABNORMAL LOW (ref 26.0–34.0)
MCHC: 27 g/dL — ABNORMAL LOW (ref 30.0–36.0)
MCV: 70.8 fL — ABNORMAL LOW (ref 80.0–100.0)
Platelets: 277 10*3/uL (ref 150–400)
RBC: 3.87 MIL/uL (ref 3.87–5.11)
RDW: 25.7 % — ABNORMAL HIGH (ref 11.5–15.5)
WBC: 7.2 10*3/uL (ref 4.0–10.5)
nRBC: 0.3 % — ABNORMAL HIGH (ref 0.0–0.2)

## 2019-05-26 LAB — GLUCOSE, CAPILLARY
Glucose-Capillary: 101 mg/dL — ABNORMAL HIGH (ref 70–99)
Glucose-Capillary: 95 mg/dL (ref 70–99)

## 2019-05-26 LAB — IRON AND TIBC
Iron: 15 ug/dL — ABNORMAL LOW (ref 28–170)
Saturation Ratios: 3 % — ABNORMAL LOW (ref 10.4–31.8)
TIBC: 517 ug/dL — ABNORMAL HIGH (ref 250–450)
UIBC: 502 ug/dL

## 2019-05-26 MED ORDER — SODIUM CHLORIDE 0.9 % IV SOLN
510.0000 mg | Freq: Once | INTRAVENOUS | Status: AC
  Administered 2019-05-26: 510 mg via INTRAVENOUS
  Filled 2019-05-26: qty 17

## 2019-05-26 MED ORDER — IRON HIGH-POTENCY 325 MG PO TABS: 325.0000 mg | ORAL_TABLET | Freq: Every day | ORAL | 6 refills | Status: DC

## 2019-05-26 NOTE — Progress Notes (Signed)
  Echocardiogram 2D Echocardiogram has been performed.  Virginia [name] 05/26/2019, 10:52 AM

## 2019-05-26 NOTE — Discharge Summary (Signed)
Physician Discharge Summary  Virginia [name] ZOX:[id number] DOB: July 16, 1941 DOA: 05/25/2019  PCP: [name], MD  Admit date: 05/25/2019 Discharge date: 05/26/2019  Admitted From: Observation Disposition: home  Recommendations for Outpatient Follow-up:  1. Follow up with PCP within 2-3 days 2. Please obtain CBC at that appointment 3. Please follow up with GI as discussed  Home Health:No Equipment/Devices: None Discharge Condition:Stable CODE STATUS:Full code Diet recommendation: Regular healthy diet  Brief/Interim Summary: Per admission history and physical: HPI: Virginia [name] is a 78 y.o. female with medical history significant for htn, oa, ckd, dysphagia and preDM who presents to the ER secondary to labs obtained by her PCP showing a hemoglobin of 5.4.  Patient reports over the last several months has become progressively more short of breath.  Report was associated with exertion, nothing makes it better rest will make it better.  She denied any hematuria any hematochezia any melena.  Denied any history of anemia.  She did reports a colonoscopy approximately 10 years ago with only THREE  polyps identified.  In the ER patient's was heme-negative, hemoglobin was 5.4 and patient was consulted to the hospital service for further eval and treatment.   ED Course: Hgb 5.4, hem negative, GFR >60 despite EMR reporting CKD.  Patient was typed and crossed in the ER with orders for transfusion of 2 units.  Hospital course: Acute on chronic anemia.  Patient's was heme-negative in the ER.  She was transfused 2 units of packed red blood cells, iron panel was obtained which showed patient to be iron deficient.  Patient's hemoglobin had appropriate response.  She denies any bleeding but did report history of diverticulosis with intermittent pain over the last several years.  Discussed with the patient GI consultation in the hospital but her husband died 03-11-2023 and funeral is this Saturday.   Patient is requested to be discharged in order to make arrangements and to be present for the funeral.  An extensive discussion on the  importance of close follow-up with both her family doctor and GI for further evaluation.  She did indicate that a colonoscopy within the last year with no indication of masses but they did identify diverticulosis.  Patient was given 1 dose of IV iron and placed on iron tablets on discharge. She will continue home medications for blood pressure.  As noted previously she had a diagnosis of CKD in her EMR but none noted on current labs.  She will also continue home medications for arthritis level avoiding NSAIDs and aspirin currently.   Discharge Diagnoses:  Active Problems:   Essential hypertension   Osteoarthritis   Chronic kidney disease   Acute on chronic anemia    Discharge Instructions  Discharge Instructions    Call MD for:   Complete by: As directed    For any acute change in medical condition to include but not limited to nausea vomiting fevers chills chest pain shortness of breath or bleeding.   Diet - low sodium heart healthy   Complete by: As directed    Increase activity slowly   Complete by: As directed      Allergies as of 05/26/2019      Reactions   Penicillins Hives   Statins Other (See Comments)   pain in base of thumbs      Medication List    STOP taking these medications   aspirin 81 MG tablet     TAKE these medications   AIRBORNE GUMMIES PO Take 2 each by mouth  daily.   Fenofibric Acid 135 MG Cpdr Take 1 tablet by mouth daily.   Fish Oil 1000 MG Caps Take 1,000 mg by mouth daily. 2000 mg in the morning and 1000 mg at night   fluticasone 50 MCG/ACT nasal spray Commonly known as: FLONASE Place 2 sprays into both nostrils daily.   Iron High-Potency 325 MG Tabs Take 325 mg by mouth daily.   loratadine 10 MG tablet Commonly known as: CLARITIN Take 10 mg by mouth daily.   metoprolol tartrate 50 MG tablet Commonly  known as: LOPRESSOR TAKE 1 TABLET BY MOUTH TWICE A DAY       Allergies  Allergen Reactions  . Penicillins Hives  . Statins Other (See Comments)    pain in base of thumbs    Consultations:  None   Procedures/Studies: Ct Angio Head W Or Wo Contrast  Result Date: 05/10/2019 CLINICAL DATA:  RIGHT ear tinnitus since February 2020 EXAM: CT ANGIOGRAPHY HEAD TECHNIQUE: Multidetector CT imaging of the head was performed using the standard protocol during bolus administration of intravenous contrast. Multiplanar CT image reconstructions and MIPs were obtained to evaluate the vascular anatomy. CONTRAST:  23mL ISOVUE-370 IOPAMIDOL (ISOVUE-370) INJECTION 76% COMPARISON:  None. FINDINGS: CT HEAD Brain: No evidence of acute infarction, hemorrhage, hydrocephalus, extra-axial collection or mass lesion/mass effect. Vascular: Reported separately. Skull: Normal. Negative for fracture or focal lesion. Sinuses: Imaged portions are clear. Orbits: No acute finding. CTA HEAD Anterior circulation: No significant stenosis, proximal occlusion, aneurysm, or vascular malformation. Posterior circulation: No significant stenosis, proximal occlusion, aneurysm, or vascular malformation. Venous sinuses: As permitted by contrast timing, patent. Anatomic variants: None Delayed phase: No abnormal intracranial enhancement. IMPRESSION: Negative exam. No specific abnormality is seen which could be associated with tinnitus. Electronically Signed   By: Staci Righter M.D.   On: 05/10/2019 08:32   Dg Chest 2 View  Result Date: 05/25/2019 CLINICAL DATA:  Shortness of breath.  Recent toe injury. EXAM: CHEST - 2 VIEW COMPARISON:  None. FINDINGS: Heart size is mildly enlarged. Lungs are clear. No pulmonary edema. There may be mild curvature in the thoracolumbar spine. No large pleural effusions. IMPRESSION: No active cardiopulmonary disease. Heart size is mildly prominent. Electronically Signed   By: Markus Daft M.D.   On: 05/25/2019 17:46    Dg Toe 5th Right  Result Date: 05/25/2019 CLINICAL DATA:  Patient stubbed her small toe last week. The 4th and 5th toes are currently taped together. EXAM: RIGHT FIFTH TOE COMPARISON:  None. FINDINGS: The bones appear mildly demineralized. No displaced acute fracture or dislocation is identified. On the PA view, there is mild irregularity of the proximal 5th phalangeal cortex laterally which could reflect a nondisplaced fracture. There are mild degenerative changes in the small and 4th toes. There is mild soft tissue swelling in the small toe. No foreign bodies. IMPRESSION: Possible nondisplaced extra-articular fracture of the 5th proximal phalanx. Electronically Signed   By: Richardean Sale M.D.   On: 05/25/2019 17:48       Subjective: No complaints.  Patient reports she felt fine this morning Question to be discharged to facilitate arrangements for her husband's funeral Saturday  Discharge Exam: Vitals:   05/26/19 0020 05/26/19 0441  BP: (!) 141/72 (!) 140/59  Pulse: (!) 58 64  Resp: 18 19  Temp: 99.2 F (37.3 C) 98.7 F (37.1 C)  SpO2:  97%   Vitals:   05/25/19 2053 05/25/19 2137 05/26/19 0020 05/26/19 0441  BP: (!) 162/84 (!) 149/63 (!) 141/72 Marland Kitchen)  140/59  Pulse: 77 77 (!) 58 64  Resp: 18 18 18 19   Temp: 98.1 F (36.7 C) 98.3 F (36.8 C) 99.2 F (37.3 C) 98.7 F (37.1 C)  TempSrc: Oral Oral  Oral  SpO2: 98% 100%  97%  Weight:    82.4 kg  Height:    5\' 4"  (1.626 m)    General: Pt is alert, awake, not in acute distress Cardiovascular: RRR, S1/S2 +, no rubs, no gallops Respiratory: CTA bilaterally, no wheezing, no rhonchi Abdominal: Soft, NT, ND, bowel sounds + Extremities: no edema, no cyanosis    The results of significant diagnostics from this hospitalization (including imaging, microbiology, ancillary and laboratory) are listed below for reference.     Microbiology: Recent Results (from the past 240 hour(s))  SARS Coronavirus 2 (CEPHEID - Performed in [place] hospital lab), Hosp Order     Status: None   Collection Time: 05/25/19  4:01 PM   Specimen: Nasopharyngeal Swab  Result Value Ref Range Status   SARS Coronavirus 2 NEGATIVE NEGATIVE Final    Comment: (NOTE) If result is NEGATIVE SARS-CoV-2 target nucleic acids are NOT DETECTED. The SARS-CoV-2 RNA is generally detectable in upper and lower  respiratory specimens during the acute phase of infection. The lowest  concentration of SARS-CoV-2 viral copies this assay can detect is 250  copies / mL. A negative result does not preclude SARS-CoV-2 infection  and should not be used as the sole basis for treatment or other  patient management decisions.  A negative result may occur with  improper specimen collection / handling, submission of specimen other  than nasopharyngeal swab, presence of viral mutation(s) within the  areas targeted by this assay, and inadequate number of viral copies  (<250 copies / mL). A negative result must be combined with clinical  observations, patient history, and epidemiological information. If result is POSITIVE SARS-CoV-2 target nucleic acids are DETECTED. The SARS-CoV-2 RNA is generally detectable in upper and lower  respiratory specimens dur ing the acute phase of infection.  Positive  results are indicative of active infection with SARS-CoV-2.  Clinical  correlation with patient history and other diagnostic information is  necessary to determine patient infection status.  Positive results do  not rule out bacterial infection or co-infection with other viruses. If result is PRESUMPTIVE POSTIVE SARS-CoV-2 nucleic acids MAY BE PRESENT.   A presumptive positive result was obtained on the submitted specimen  and confirmed on repeat testing.  While 2019 novel coronavirus  (SARS-CoV-2) nucleic acids may be present in the submitted sample  additional confirmatory testing may be necessary for epidemiological  and / or clinical management purposes  to  differentiate between  SARS-CoV-2 and other Sarbecovirus currently known to infect humans.  If clinically indicated additional testing with an alternate test  methodology [phone]) is advised. The SARS-CoV-2 RNA is generally  detectable in upper and lower respiratory sp ecimens during the acute  phase of infection. The expected result is Negative. Fact Sheet for Patients:  StrictlyIdeas.no Fact Sheet for Healthcare Providers: BankingDealers.co.za This test is not yet approved or cleared by the [place] FDA and has been authorized for detection and/or diagnosis of SARS-CoV-2 by FDA under an Emergency Use Authorization (EUA).  This EUA will remain in effect (meaning this test can be used) for the duration of the COVID-19 declaration under Section 564(b)(1) of the Act, 21 U.S.C. section 360bbb-3(b)(1), unless the authorization is terminated or revoked sooner. Performed at [facility], [place]  [address] [place], [place] [address]      Labs: BNP (last 3 results) No results for input(s): BNP in the last 8760 hours. Basic Metabolic Panel: Recent Labs  Lab 05/24/19 1638 05/25/19 1552 05/26/19 0545  NA 141 141 140  K 4.7 4.0 3.7  CL 107 107 109  CO2 27 26 25   GLUCOSE 120* 101* 105*  BUN 23 23 16   CREATININE 0.91 0.86 0.84  CALCIUM 9.9 9.7 9.3   Liver Function Tests: Recent Labs  Lab 05/24/19 1638 05/25/19 1552  AST 9 12*  ALT 8 12  ALKPHOS 38* 37*  BILITOT 0.4 0.4  PROT 6.6 6.3*  ALBUMIN 4.0 3.4*   Recent Labs  Lab 05/25/19 1552  LIPASE 48   No results for input(s): AMMONIA in the last 168 hours. CBC: Recent Labs  Lab 05/24/19 1638 05/25/19 1552 05/26/19 0545  WBC 8.7 7.8 7.2  NEUTROABS 5.1 4.4  --   HGB 6.0 Repeated and verified X2.* 5.4* 7.4*  HCT 21.0 Repeated and verified X2.* 21.9* 27.4*  MCV 57.3* 64.0* 70.8*  PLT 344.0 320 277   Cardiac Enzymes: No results for input(s): CKTOTAL,  CKMB, CKMBINDEX, TROPONINI in the last 168 hours. BNP: Invalid input(s): POCBNP CBG: Recent Labs  Lab 05/25/19 2043 05/26/19 0805  GLUCAP 105* 101*   D-Dimer No results for input(s): DDIMER in the last 72 hours. Hgb A1c Recent Labs    05/24/19 1638  HGBA1C 6.2   Lipid Profile No results for input(s): CHOL, HDL, LDLCALC, TRIG, CHOLHDL, LDLDIRECT in the last 72 hours. Thyroid function studies Recent Labs    05/24/19 1638  TSH 2.98   Anemia work up Recent Labs    05/26/19 0545 05/26/19 1021  FERRITIN 3*  --   TIBC 517*  --   IRON 15*  --   RETICCTPCT  --  1.8   Urinalysis    Component Value Date/Time   COLORURINE YELLOW 06/14/2014 [place] 06/14/2014 0927   LABSPEC 1.010 06/14/2014 0927   PHURINE 5.5 06/14/2014 0927   GLUCOSEU NEGATIVE 06/14/2014 0927   HGBUR NEGATIVE 06/14/2014 0927   BILIRUBINUR NEGATIVE 06/14/2014 0927   KETONESUR NEGATIVE 06/14/2014 0927   PROTEINUR NEG 11/09/2013 1508   UROBILINOGEN 0.2 06/14/2014 0927   NITRITE NEGATIVE 06/14/2014 0927   LEUKOCYTESUR MODERATE (A) 06/14/2014 0927   Sepsis Labs Invalid input(s): PROCALCITONIN,  WBC,  LACTICIDVEN Microbiology Recent Results (from the past 240 hour(s))  SARS Coronavirus 2 (CEPHEID - Performed in [place] hospital lab), Hosp Order     Status: None   Collection Time: 05/25/19  4:01 PM   Specimen: Nasopharyngeal Swab  Result Value Ref Range Status   SARS Coronavirus 2 NEGATIVE NEGATIVE Final    Comment: (NOTE) If result is NEGATIVE SARS-CoV-2 target nucleic acids are NOT DETECTED. The SARS-CoV-2 RNA is generally detectable in upper and lower  respiratory specimens during the acute phase of infection. The lowest  concentration of SARS-CoV-2 viral copies this assay can detect is 250  copies / mL. A negative result does not preclude SARS-CoV-2 infection  and should not be used as the sole basis for treatment or other  patient management decisions.  A negative result  may occur with  improper specimen collection / handling, submission of specimen other  than nasopharyngeal swab, presence of viral mutation(s) within the  areas targeted by this assay, and inadequate number of viral copies  (<250 copies / mL). A negative result must be combined with clinical  observations, patient history, and epidemiological information. If result is POSITIVE SARS-CoV-2 target nucleic acids are DETECTED. The SARS-CoV-2 RNA is generally detectable in upper and lower  respiratory specimens dur ing the acute phase of infection.  Positive  results are indicative of active infection with SARS-CoV-2.  Clinical  correlation with patient history and other diagnostic information is  necessary to determine patient infection status.  Positive results do  not rule out bacterial infection or co-infection with other viruses. If result is PRESUMPTIVE POSTIVE SARS-CoV-2 nucleic acids MAY BE PRESENT.   A presumptive positive result was obtained on the submitted specimen  and confirmed on repeat testing.  While 2019 novel coronavirus  (SARS-CoV-2) nucleic acids may be present in the submitted sample  additional confirmatory testing may be necessary for epidemiological  and / or clinical management purposes  to differentiate between  SARS-CoV-2 and other Sarbecovirus currently known to infect humans.  If clinically indicated additional testing with an alternate test  methodology [phone]) is advised. The SARS-CoV-2 RNA is generally  detectable in upper and lower respiratory sp ecimens during the acute  phase of infection. The expected result is Negative. Fact Sheet for Patients:  StrictlyIdeas.no Fact Sheet for Healthcare Providers: BankingDealers.co.za This test is not yet approved or cleared by the [place] FDA and has been authorized for detection and/or diagnosis of SARS-CoV-2 by FDA under an Emergency Use Authorization (EUA).   This EUA will remain in effect (meaning this test can be used) for the duration of the COVID-19 declaration under Section 564(b)(1) of the Act, 21 U.S.C. section 360bbb-3(b)(1), unless the authorization is terminated or revoked sooner. Performed at [facility], [place] [address] [place], [place] [address]      Time coordinating discharge: Over 30 minutes  SIGNED:   Nicolette Bang, MD  [facility] 05/26/2019, 11:28 AM Pager   If 7PM-7AM, please contact night-coverage [email or web address] Password [email or web address]

## 2019-05-26 NOTE — Plan of Care (Signed)
Condition stable. Discharge teaching completed with written copy given to patient.

## 2019-05-27 ENCOUNTER — Telehealth: Payer: Self-pay | Admitting: *Deleted

## 2019-05-27 LAB — HAPTOGLOBIN: Haptoglobin: 92 mg/dL (ref 42–346)

## 2019-05-27 NOTE — Telephone Encounter (Signed)
Tried calling pt to make hosp f/u appt w/PCP. There was no answer LMOM RTC.Marland KitchenJohny Chess

## 2019-05-31 NOTE — Progress Notes (Signed)
Virtual Visit via Video Note  I connected with Virginia [name] on 06/01/19 at 10:00 AM EDT by a video enabled telemedicine application and verified that I am speaking with the correct person using two identifiers.   I discussed the limitations of evaluation and management by telemedicine and the availability of in person appointments. The patient expressed understanding and agreed to proceed.  The patient is currently at home and I am in the office.    No referring provider.    History of Present Illness: She is here for follow up from her recent hospitalization.   She was here 6/23 complaining of shortness of breath.  Had been going on for a while, but had gotten worse.  She also stated fatigue, mild leg swelling.  Blood work revealed significant anemia with a hemoglobin of 5.4 and I advised her to go to the emergency room, which she did on 6/24 and she was admitted.  She did not have any symptoms suggestive of active bleeding-denied hematuria or hematochezia, melena.  She did not have a history of anemia.  She was heme-negative in the ED.  GI was consulted.  She was transfused 2 units of packed red blood cells.    Acute on chronic anemia: Heme-negative in the ED Took only occasional NSAIDs-2 Aleve approximately once a week and aspirin 81 mg daily, no symptoms of active bleeding Transfused 2 units packed red blood cells Iron deficient by blood work  Colonoscopy within the last year showed no masses, but did show diverticulosis per patient She did receive 1 dose of IV iron and was placed on iron orally GI was consulted but she wanted to be discharged because of her husband's funeral she was diagnosed with acute on chronic anemia. Just continued aspirin 81 mg daily   Hypertension: Continued on home medication   Colonoscopy 05/24/2018: Multiple polyps removed.  Diverticulosis.  Internal hemorrhoids.   Acute on chronic anemia, shortness of breath: She has a lot more energy and  denies SOB and lightheadedness.  She denies any blood in the stool, black stool or blood in the urine.  She is taking iron pill daily and that does cause slight nausea.  She otherwise feels much better.  She has stopped the baby aspirin and has not taken any NSAIDs.  Hypertension: She is taking her blood pressure medication daily as prescribed.  Tinnitus: She states her tinnitus is better since the blood transfusion.  It is still there, but mild and she does not hear at all the time.      Review of Systems  Constitutional: Negative for chills, fever and malaise/fatigue.  Respiratory: Positive for cough (dry cough, - chronic). Negative for shortness of breath and wheezing.   Cardiovascular: Positive for leg swelling (mild - better). Negative for chest pain and palpitations.  Gastrointestinal: Positive for nausea (after taking iron pill). Negative for abdominal pain, blood in stool, constipation, diarrhea, heartburn and melena.  Genitourinary: Negative for hematuria.  Neurological: Negative for dizziness and headaches.     Social History   Socioeconomic History  . Marital status: Married    Spouse name: Not on file  . Number of children: 2  . Years of education: Not on file  . Highest education level: Not on file  Occupational History  . Not on file  Social Needs  . Financial resource strain: Not hard at all  . Food insecurity    Worry: Never true    Inability: Never true  . Transportation needs  Medical: No    Non-medical: No  Tobacco Use  . Smoking status: Never Smoker  . Smokeless tobacco: Never Used  . 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 Alcohol use: No    Alcohol/week: 0.0 standard drinks  . Drug use: No  . Sexual activity: Not Currently  Lifestyle  . Physical activity    Days per week: 2 days    Minutes per session: 30 min  . Stress: Rather much  Relationships  . Social connections     Talks on phone: More than three times a week    Gets together: More than three times a week    Attends religious service: More than 4 times per year    Active member of club or organization: Yes    Attends meetings of clubs or organizations: 1 to 4 times per year    Relationship status: Married  Other Topics Concern  . Not on file  Social History Narrative   Lives with spouse / 2 children;   One in [place] and one is in [place]   Retired Pharmacist, hospital     Observations/Objective: Appears well in NAD   Assessment and Plan:  See Problem List for Assessment and Plan of chronic medical problems.   Follow Up Instructions:    I discussed the assessment and treatment plan with the patient. The patient was provided an opportunity to ask questions and all were answered. The patient agreed with the plan and demonstrated an understanding of the instructions.   The patient was advised to call back or seek an in-person evaluation if the symptoms worsen or if the condition fails to improve as anticipated.    [name], MD

## 2019-05-31 NOTE — Telephone Encounter (Signed)
Per chart pt called back on Monday 05/30/19, and made a virtual appt for 06/01/19. TCM questionnaire was was not done. Tried to call pt this am but there was no answer LMOM confirming appt for tomorrow.Marland KitchenJohny Chess

## 2019-06-01 ENCOUNTER — Ambulatory Visit (INDEPENDENT_AMBULATORY_CARE_PROVIDER_SITE_OTHER): Payer: Medicare Other | Admitting: Internal Medicine

## 2019-06-01 ENCOUNTER — Encounter: Payer: Self-pay | Admitting: Internal Medicine

## 2019-06-01 DIAGNOSIS — D649 Anemia, unspecified: Secondary | ICD-10-CM | POA: Diagnosis not present

## 2019-06-01 DIAGNOSIS — I10 Essential (primary) hypertension: Secondary | ICD-10-CM

## 2019-06-01 NOTE — Assessment & Plan Note (Signed)
BP Readings from Last 3 Encounters:  05/26/19 (!) 140/59  05/24/19 138/60  01/26/19 (!) 118/54   Overall has been controlled Continue current medication

## 2019-06-01 NOTE — Assessment & Plan Note (Signed)
Acute on chronic anemia over the past several months She thinks she really probably started having symptoms in January No symptoms of GI bleed Was taking aspirin 81 mg daily and 400 mg of Advil as needed-approximately once a week Presented with shortness of breath that was progressively worsening and found to have significant anemia Received 2 units packed red blood cells, IV infusion in the hospital and has felt much better since then Not taking any NSAIDs Taking iron daily We will recheck CBC in the next couple of weeks Will refer to GI for them to evaluate if further testing is needed Will monitor CBC, iron levels-follow-up with me in 2 months Stressed that if she experiences any shortness of breath or other symptoms concerning for anemia she needs to call

## 2019-06-09 ENCOUNTER — Encounter: Payer: Self-pay | Admitting: Internal Medicine

## 2019-06-09 ENCOUNTER — Other Ambulatory Visit (INDEPENDENT_AMBULATORY_CARE_PROVIDER_SITE_OTHER): Payer: Medicare Other

## 2019-06-09 DIAGNOSIS — D649 Anemia, unspecified: Secondary | ICD-10-CM | POA: Diagnosis not present

## 2019-06-09 LAB — CBC WITH DIFFERENTIAL/PLATELET
Basophils Absolute: 0.1 10*3/uL (ref 0.0–0.1)
Basophils Relative: 1.1 % (ref 0.0–3.0)
Eosinophils Absolute: 0.2 10*3/uL (ref 0.0–0.7)
Eosinophils Relative: 3.5 % (ref 0.0–5.0)
HCT: 34.9 % — ABNORMAL LOW (ref 36.0–46.0)
Hemoglobin: 10.9 g/dL — ABNORMAL LOW (ref 12.0–15.0)
Lymphocytes Relative: 41 % (ref 12.0–46.0)
Lymphs Abs: 2.1 10*3/uL (ref 0.7–4.0)
MCHC: 31.1 g/dL (ref 30.0–36.0)
MCV: 76.7 fl — ABNORMAL LOW (ref 78.0–100.0)
Monocytes Absolute: 0.3 10*3/uL (ref 0.1–1.0)
Monocytes Relative: 5.8 % (ref 3.0–12.0)
Neutro Abs: 2.5 10*3/uL (ref 1.4–7.7)
Neutrophils Relative %: 48.6 % (ref 43.0–77.0)
Platelets: 238 10*3/uL (ref 150.0–400.0)
RBC: 4.54 Mil/uL (ref 3.87–5.11)
RDW: 39.6 % — ABNORMAL HIGH (ref 11.5–15.5)
WBC: 5.2 10*3/uL (ref 4.0–10.5)

## 2019-06-22 ENCOUNTER — Other Ambulatory Visit (INDEPENDENT_AMBULATORY_CARE_PROVIDER_SITE_OTHER): Payer: Medicare Other

## 2019-06-22 ENCOUNTER — Ambulatory Visit (INDEPENDENT_AMBULATORY_CARE_PROVIDER_SITE_OTHER): Payer: Medicare Other | Admitting: Physician Assistant

## 2019-06-22 ENCOUNTER — Encounter: Payer: Self-pay | Admitting: Physician Assistant

## 2019-06-22 VITALS — BP 154/70 | HR 68 | Temp 97.6°F | Ht 62.25 in | Wt 182.0 lb

## 2019-06-22 DIAGNOSIS — Z1211 Encounter for screening for malignant neoplasm of colon: Secondary | ICD-10-CM

## 2019-06-22 DIAGNOSIS — Z86010 Personal history of colonic polyps: Secondary | ICD-10-CM

## 2019-06-22 DIAGNOSIS — D509 Iron deficiency anemia, unspecified: Secondary | ICD-10-CM

## 2019-06-22 DIAGNOSIS — R5383 Other fatigue: Secondary | ICD-10-CM | POA: Diagnosis not present

## 2019-06-22 LAB — CBC WITH DIFFERENTIAL/PLATELET
Basophils Absolute: 0.1 10*3/uL (ref 0.0–0.1)
Basophils Relative: 1.2 % (ref 0.0–3.0)
Eosinophils Absolute: 0.2 10*3/uL (ref 0.0–0.7)
Eosinophils Relative: 3.6 % (ref 0.0–5.0)
HCT: 37.6 % (ref 36.0–46.0)
Hemoglobin: 12 g/dL (ref 12.0–15.0)
Lymphocytes Relative: 37.9 % (ref 12.0–46.0)
Lymphs Abs: 2.4 10*3/uL (ref 0.7–4.0)
MCHC: 31.8 g/dL (ref 30.0–36.0)
MCV: 81.2 fl (ref 78.0–100.0)
Monocytes Absolute: 0.5 10*3/uL (ref 0.1–1.0)
Monocytes Relative: 8 % (ref 3.0–12.0)
Neutro Abs: 3.2 10*3/uL (ref 1.4–7.7)
Neutrophils Relative %: 49.3 % (ref 43.0–77.0)
Platelets: 216 10*3/uL (ref 150.0–400.0)
RBC: 4.63 Mil/uL (ref 3.87–5.11)
RDW: 35.6 % — ABNORMAL HIGH (ref 11.5–15.5)
WBC: 6.5 10*3/uL (ref 4.0–10.5)

## 2019-06-22 LAB — TSH: TSH: 3.04 u[IU]/mL (ref 0.35–4.50)

## 2019-06-22 LAB — IGA: IgA: 331 mg/dL (ref 68–378)

## 2019-06-22 NOTE — Patient Instructions (Addendum)
If you are age 76 or older, your body mass index should be between 23-30. Your Body mass index is 33.02 kg/m. If this is out of the aforementioned range listed, please consider follow up with your Primary Care Provider.  If you are age 42 or younger, your body mass index should be between 19-25. Your Body mass index is 33.02 kg/m. If this is out of the aformentioned range listed, please consider follow up with your Primary Care Provider.   You have been scheduled for an endoscopy. Please follow written instructions given to you at your visit today. If you use inhalers (even only as needed), please bring them with you on the day of your procedure. Your physician has requested that you go to www.startemmi.com and enter the access code given to you at your visit today. This web site gives a general overview about your procedure. However, you should still follow specific instructions given to you by our office regarding your preparation for the procedure.  Your provider has requested that you go to the basement level for lab work before leaving today. Press "B" on the elevator. The lab is located at the first door on the left as you exit the elevator.  You are being scheduled for Feraheme injection.  Beth will contact you with a date.  Okay to stop over-the-counter Iron if cannot tolerate.   CAPSOCAM CAPSULE ENDOSCOPY PATIENT INSTRUCTION SHEET  Virginia [name] [birth year] [id number]   1. 06/23/19 Seven (7) days prior to capsule endoscopy stop taking iron supplements and carafate.  2. 06/28/19 Two (2) days prior to capsule endoscopy stop taking aspirin or any arthritis drugs.  3. 06/29/19 Day before capsule endoscopy purchase a 238 gram bottle of Miralax from the laxative section of your drug store, and a 32 oz. bottle of Gatorade (no red).    4. 06/29/19 One (1) day prior to capsule endoscopy: a) Stop smoking. b) Eat a regular diet until 12:00 Noon. c) After 12:00 Noon take only the  following: Black coffee  Jell-O (no fruit or red Jell-o) Water   Bouillon (chicken or beef) 7-Up   Cranberry Juice Tea   Kool-Aid Popsicle (not red) Sprite   Coke Ginger Ale  Pepsi Mountain Dew Gatorade d) At 6:00 pm the evening before your appointment, drink 7 capfuls (105 grams) of Miralax with 32 oz. Gatorade. Drink 8 oz every 15 minutes until gone. e) Nothing to eat or drink after midnight except medications with a sip of water.  5. 06/30/19 Day of capsule endoscopy:            Do not have anything to eat or drink after midnight No medications for 2 hours prior to your test.  6. Please arrive at [facility]  3rd floor patient registration area by 8:30 am on: 06/30/19.   For any questions: Call [facility] at [phone] and ask to speak with one of the capsule endoscopy nurses.      What you should know.   You have been scheduled for a Capsule Endoscopy with Capsocam.  You will swallow a vitamin size pill that contains cameras that will take pictures of your small intestine (bowel).  Your small bowel connects to your stomach on one end and the large intestine or bowel on the other. The capsule moves through the gastrointestinal tract taking pictures along the way. The images are stored on the capsule.  1-5 days after the capsule ingestion you will retrieve the capsule and mail it in a prepaid  envelope to [facility] to download the images.  Your physician will be provided a copy of the images to review.   Who should have a capsule endoscopy?  You may need a small bowel capsule endoscopy if you have symptoms, such as blood in your stool, chronic pain, diarrhea, or unexplained anemia. The small intestine is a hollow organ that cannot be easily visualized with a scope due to its length.  The capsule endoscopy allows your physician to directly visualize the intestine.  The pictures may show intestine growths, inflammation, or bleeding in the small intestine.   What are the  risks with a capsule endoscopy?  The pictures may not be clear or give [place] a clear cause of your symptoms The capsule may become trapped in the esophagus or intestines.  You may need surgery or endoscopic procedures to remove the capsule. You may not have a MRI until the capsule endoscopy has been retrieved.   How do I retrieve the capsule?  You will be provided a kit with step-by-step instructions for capsule retrieval and mailing.  You should expect to retrieve the capsule in 1-5 days after ingestion.  This will depend on your individual bowel habits.    Thank you for choosing me and [facility].   Amy Esterwood, PA-C

## 2019-06-22 NOTE — Progress Notes (Signed)
cbc

## 2019-06-22 NOTE — Progress Notes (Signed)
Reviewed. I agree with documentation including the assessment and plan.  Darcie Mellone L. Tavionna Grout, MD, MPH 

## 2019-06-22 NOTE — Progress Notes (Addendum)
Subjective:    Patient ID: Virginia [name], female    DOB: 07/14/1941, 78 y.o.   MRN: [id number]  HPI Virginia [name] is a pleasant 78 year old white female, known to Dr. Hilarie Fredrickson, who is referred today by Dr. Billey Gosling for evaluation of severe iron deficiency anemia. Patient had been seen by PCP in late June with complaints of fatigue and shortness of breath.  She was found to have a hemoglobin of 5.4 and was admitted to the hospitalist service.  She stayed overnight, received 2 units of packed RBCs and 1 Feraheme infusion.  She was documented to be Hemoccult negative.  Serum iron was 15 TIBC 517, iron saturation 3, and ferritin of 3. Follow-up labs on 06/09/2019 hemoglobin 10.9/hematocrit 34.9/MCV of 76. Reviewing her chart hemoglobin was 12.7 and November 2019.  Patient states that she has had a difficult year, and had been very stressed earlier in the year caring for her husband with advanced Alzheimer's who passed away I believe 2 months ago.  She says she knows she was not paying very much attention to her own health and had been feeling very rundown and fatigued and somewhat short of breath with exertion but had initially thought this was due to stress and exhaustion. There is no complaints of any abdominal pain, changes in bowel habits melena or hematochezia.  No complaints of heartburn indigestion or dysphasia.  Appetite has been okay and she is concerned because she is actually gained weight over the past couple of months. She was started on oral ferrous sulfate 325 mg p.o. daily and has noted dark stools since and epigastric pain that she associates with taking the iron supplement.  She is trying to take this with food which helps a little bit but says it still bothers her stomach every day. She also mentions that she has a sister with celiac disease.  She had previously been on baby aspirin which was stopped and was found, no NSAIDs.  She had undergone colonoscopy in June 2019 per Dr. Hilarie Fredrickson  with finding of 3 polyps the largest was 5 mm and all of these were tubular adenomas.  Also has multiple diverticuli and internal hemorrhoids, follow-up plan for 3-year interval. She had EGD in 2010, at [facility] for complaints of dysphasia.  She was found to have a Schatzki's ring at the GE junction and was dilated, also noted to have duodenitis.  Review of Systems Pertinent positive and negative review of systems were noted in the above HPI section.  All other review of systems was otherwise negative.  Outpatient Encounter Medications as of 06/22/2019  Medication Sig  . Choline Fenofibrate (FENOFIBRIC ACID) 135 MG CPDR Take 1 tablet by mouth daily. (Patient taking differently: Take 135 mg by mouth daily. )  . Ferrous Sulfate (IRON HIGH-POTENCY) 325 MG TABS Take 325 mg by mouth daily.  . fluticasone (FLONASE) 50 MCG/ACT nasal spray Place 2 sprays into both nostrils daily.  Marland Kitchen loratadine (CLARITIN) 10 MG tablet Take 10 mg by mouth daily.  . metoprolol tartrate (LOPRESSOR) 50 MG tablet TAKE 1 TABLET BY MOUTH TWICE A DAY (Patient taking differently: Take 50 mg by mouth 2 (two) times daily. )  . Multiple Vitamins-Minerals (AIRBORNE GUMMIES PO) Take 2 each by mouth daily.  . Omega-3 Fatty Acids (FISH OIL) 1000 MG CAPS Take 1,000 mg by mouth daily. 2000 mg in the morning and 1000 mg at night   No facility-administered encounter medications on file as of 06/22/2019.    Allergies  Allergen Reactions  .  Penicillins Hives    Did it involve swelling of the face/tongue/throat, SOB, or low BP? No Did it involve sudden or severe rash/hives, skin peeling, or any reaction on the inside of your mouth or nose? No Did you need to seek medical attention at a hospital or doctor's office? No When did it last happen?many years ago If all above answers are "NO", may proceed with cephalosporin use.   . Statins Other (See Comments)    pain in base of thumbs   Patient Active Problem List   Diagnosis Date Noted  .  Acute on chronic anemia 05/25/2019  . DOE (dyspnea on exertion) 05/24/2019  . Tinnitus 05/24/2019  . Pain of toe of right foot 05/24/2019  . Caregiver stress 07/21/2018  . Left foot pain 05/13/2017  . Prediabetes 04/19/2016  . Constipation 04/16/2016  . Bilateral thumb pain 04/16/2016  . Dysphagia 10/17/2015  . Chronic kidney disease 08/13/2015  . Osteopenia 06/21/2014  . Back pain   . Sciatica of right side 05/12/2011  . 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 Osteoarthritis 01/01/2010  . FIBROIDS, UTERUS 12/13/2009  . Hyperlipidemia 12/13/2009  . Essential hypertension 12/13/2009  . ALLERGIC RHINITIS 12/13/2009  . COLONIC POLYPS, HX OF 12/13/2009   Social History   Socioeconomic History  . Marital status: Married    Spouse name: Not on file  . Number of children: 2  . Years of education: Not on file  . Highest education level: Not on file  Occupational History  . Not on file  Social Needs  . Financial resource strain: Not hard at all  . Food insecurity    Worry: Never true    Inability: Never true  . Transportation needs    Medical: No    Non-medical: No  Tobacco Use  . Smoking status: Never Smoker  . Smokeless tobacco: Never Used  . 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 Alcohol use: No    Alcohol/week: 0.0 standard drinks  . Drug use: No  . Sexual activity: Not Currently  Lifestyle  . Physical activity    Days per week: 2 days    Minutes per session: 30 min  . Stress: Rather much  Relationships  . Social connections    Talks on phone: More than three times a week    Gets together: More than three times a week    Attends religious service: More than 4 times per year    Active member of club or organization: Yes    Attends meetings of clubs or organizations: 1 to 4 times per year    Relationship status: Married  . Intimate partner violence    Fear of current or ex partner: No    Emotionally  abused: No    Physically abused: No    Forced sexual activity: No  Other Topics Concern  . Not on file  Social History Narrative   Lives with spouse / 2 children;   One in [place] and one is in [place]   Retired Pharmacist, hospital    Ms. Kia's family history includes Arthritis in her mother; Coronary artery disease in her father; Diabetes in her maternal aunt and paternal aunt; Heart disease in her cousin; Hyperlipidemia in her brother and cousin.      Objective:    Vitals:   06/22/19 1028  BP: (!) 154/70  Pulse: 68  Temp: 97.6 F (36.4 C)    Physical Exam Well-developed well-nourished female/female  in no acute distress.   [id number], BMI 33  HEENT; nontraumatic normocephalic, EOMI, PE R LA, sclera anicteric. Oropharynx; not examined/wearing mass/COVID Neck; supple, no JVD Cardiovascular; regular rate and rhythm with S1-S2, no murmur rub or gallop Pulmonary; Clear bilaterally Abdomen; soft, nontender, nondistended, no palpable mass or hepatosplenomegaly, bowel sounds are active Rectal; not done, patient was documented heme negative during recent hospitalization Skin; benign exam, no jaundice rash or appreciable lesions Extremities; no clubbing cyanosis or edema skin warm and dry Neuro/Psych; alert and oriented x4, grossly nonfocal mood and affect appropriate       Assessment & Plan:   #78 78 year old white female with new diagnosis of severe iron deficiency anemia, and presented with hemoglobin of 5.4.  Symptomatic with fatigue and shortness of breath. Patient has had no GI symptoms and was heme-negative at the time she was hospitalized on 05/25/2019. She has been transfused 2 units and has had one Feraheme infusion, and hemoglobin on 06/09/2019 was 10.9.  Etiology of profound iron deficiency anemia is not clear, will need to rule out chronic intermittent low-grade GI blood loss. Rule out celiac disease with family history in her sister  #2 history of adenomatous colon  polyps-up-to-date with colonoscopy last done June 2019 and indicated for 3-year interval follow-up #4 diverticulosis #5.  History of Schatzki's ring status post dilation 2010 #6.  Chronic kidney disease #7.  Osteoarthritis  Plan; we will schedule patient for second Feraheme infusion She is not tolerating oral iron well she is advised she can discontinue oral iron Repeat CBC today, check TTG and IgA, and TSH ( weight gain) .  Serial CBCs to assure stability and repeat iron studies in 3 months Patient will be scheduled for upper endoscopy with small bowel biopsy with Dr.Beavers, as Dr. Hilarie Fredrickson  has no procedure availability.  We will also go ahead and schedule for capsule endoscopy.  If significant pathology found at EGD can cancel capsule.  Both procedures were discussed in detail with patient including indications risks and benefits and she is agreeable to proceed.  If EGD and capsule endoscopy are unrevealing, will need to repeat colonoscopy.    Amy S Esterwood PA-C 06/22/2019   Cc: [name], MD   Addendum: Reviewed and agree with assessment and management plan. I appreciate Dr. Tarri Glenn who will be performing the procedures given my lack of soon availability. Pyrtle, Lajuan Lines, MD

## 2019-06-23 ENCOUNTER — Telehealth: Payer: Self-pay | Admitting: Gastroenterology

## 2019-06-23 LAB — TISSUE TRANSGLUTAMINASE, IGA: (tTG) Ab, IgA: 1 U/mL

## 2019-06-23 NOTE — Telephone Encounter (Signed)

## 2019-06-24 ENCOUNTER — Ambulatory Visit (AMBULATORY_SURGERY_CENTER): Payer: Medicare Other | Admitting: Gastroenterology

## 2019-06-24 ENCOUNTER — Encounter: Payer: Self-pay | Admitting: Gastroenterology

## 2019-06-24 ENCOUNTER — Other Ambulatory Visit: Payer: Self-pay

## 2019-06-24 VITALS — BP 104/74 | HR 57 | Temp 98.4°F | Resp 18 | Ht 62.0 in | Wt 182.0 lb

## 2019-06-24 DIAGNOSIS — K219 Gastro-esophageal reflux disease without esophagitis: Secondary | ICD-10-CM | POA: Diagnosis not present

## 2019-06-24 DIAGNOSIS — K3189 Other diseases of stomach and duodenum: Secondary | ICD-10-CM | POA: Diagnosis not present

## 2019-06-24 DIAGNOSIS — D509 Iron deficiency anemia, unspecified: Secondary | ICD-10-CM

## 2019-06-24 DIAGNOSIS — K317 Polyp of stomach and duodenum: Secondary | ICD-10-CM | POA: Diagnosis not present

## 2019-06-24 DIAGNOSIS — K449 Diaphragmatic hernia without obstruction or gangrene: Secondary | ICD-10-CM

## 2019-06-24 DIAGNOSIS — K2970 Gastritis, unspecified, without bleeding: Secondary | ICD-10-CM

## 2019-06-24 MED ORDER — PANTOPRAZOLE SODIUM 40 MG PO TBEC: 40.0000 mg | DELAYED_RELEASE_TABLET | Freq: Every day | ORAL | 3 refills | Status: DC

## 2019-06-24 MED ORDER — SODIUM CHLORIDE 0.9 % IV SOLN: 500.0000 mL | Freq: Once | INTRAVENOUS | Status: DC

## 2019-06-24 NOTE — Progress Notes (Signed)
Called to room to assist during endoscopic procedure.  Patient ID and intended procedure confirmed with present staff. Received instructions for my participation in the procedure from the performing physician.  

## 2019-06-24 NOTE — Patient Instructions (Signed)
Resume regular diet today Continue present meds (except stop iron if you were instructed to by Amy PA and start infusions) Start pantoprazole 40mg  qam Avoid all NSAIDS as you are able Await pathology results Proceed with capsule endoscopy for further evaluation    YOU HAD AN ENDOSCOPIC PROCEDURE TODAY AT [facility]:   Refer to the procedure report that was given to you for any specific questions about what was found during the examination.  If the procedure report does not answer your questions, please call your gastroenterologist to clarify.  If you requested that your care partner not be given the details of your procedure findings, then the procedure report has been included in a sealed envelope for you to review at your convenience later.  YOU SHOULD EXPECT: Some feelings of bloating in the abdomen. Passage of more gas than usual.  Walking can help get rid of the air that was put into your GI tract during the procedure and reduce the bloating. If you had a lower endoscopy (such as a colonoscopy or flexible sigmoidoscopy) you may notice spotting of blood in your stool or on the toilet paper. If you underwent a bowel prep for your procedure, you may not have a normal bowel movement for a few days.  Please Note:  You might notice some irritation and congestion in your nose or some drainage.  This is from the oxygen used during your procedure.  There is no need for concern and it should clear up in a day or so.  SYMPTOMS TO REPORT IMMEDIATELY:    Following upper endoscopy (EGD)  Vomiting of blood or coffee ground material  New chest pain or pain under the shoulder blades  Painful or persistently difficult swallowing  New shortness of breath  Fever of 100F or higher  Black, tarry-looking stools  For urgent or emergent issues, a gastroenterologist can be reached at any hour by calling [phone].   DIET:  We do recommend a small meal at first, but then you may  proceed to your regular diet.  Drink plenty of fluids but you should avoid alcoholic beverages for 24 hours.  ACTIVITY:  You should plan to take it easy for the rest of today and you should NOT DRIVE or use heavy machinery until tomorrow (because of the sedation medicines used during the test).    FOLLOW UP: Our staff will call the number listed on your records 48-72 hours following your procedure to check on you and address any questions or concerns that you may have regarding the information given to you following your procedure. If we do not reach you, we will leave a message.  We will attempt to reach you two times.  During this call, we will ask if you have developed any symptoms of COVID 19. If you develop any symptoms (ie: fever, flu-like symptoms, shortness of breath, cough etc.) before then, please call [phone].  If you test positive for Covid 19 in the 2 weeks post procedure, please call and report this information to [place].    If any biopsies were taken you will be contacted by phone or by letter within the next 1-3 weeks.  Please call us at [phone] if you have not heard about the biopsies in 3 weeks.    SIGNATURES/CONFIDENTIALITY: You and/or your care partner have signed paperwork which will be entered into your electronic medical record.  These signatures attest to the fact that that the information above on your After Visit  Summary has been reviewed and is understood.  Full responsibility of the confidentiality of this discharge information lies with you and/or your care-partner. 

## 2019-06-24 NOTE — Progress Notes (Signed)
PT taken to PACU. Monitors in place. VSS. Report given to RN. 

## 2019-06-24 NOTE — Op Note (Signed)
[facility] Patient Name: Virginia [name] Procedure Date: 06/24/2019 8:27 AM MRN: [id number] Endoscopist: Thornton Park MD, MD Age: 78 Referring MD:  Date of Birth: [birth year] Gender: Female Account #: [id number] Procedure:                Upper GI endoscopy Indications:              Unexplained iron deficiency anemia presenting with                            hemoglobin of 5.4                           Sister with celiac disease                           History of Schatzki's Ring s/p dilation 2010                           History of colon polyps with last colonoscopy with                            Dr. Hilarie Fredrickson 2019                           - 3 polyps removed at [facility] on colonoscopy                            09/27/2004, path unknown                           - 1 81mm transverse colon TVA removed 02/15/15                           - 3 tubular adenomas removed 05/24/18 Medicines:                See the Anesthesia note for documentation of the                            administered medications Procedure:                Pre-Anesthesia Assessment:                           - Prior to the procedure, a History and Physical                            was performed, and patient medications and                            allergies were reviewed. The patient's tolerance of                            previous anesthesia was also reviewed. The risks                            and benefits of the procedure and the  sedation                            options and risks were discussed with the patient.                            All questions were answered, and informed consent                            was obtained. Prior Anticoagulants: The patient has                            taken no previous anticoagulant or antiplatelet                            agents. ASA Grade Assessment: II - A patient with                            mild systemic disease. After reviewing the risks                       and benefits, the patient was deemed in                            satisfactory condition to undergo the procedure.                           After obtaining informed consent, the endoscope was                            passed under direct vision. Throughout the                            procedure, the patient's blood pressure, pulse, and                            oxygen saturations were monitored continuously. The                            Endoscope was introduced through the mouth, and                            advanced to the fourth part of duodenum. I advanced                            the gastroscope to the hub. The upper GI endoscopy                            was accomplished without difficulty. The patient                            tolerated the procedure well. Scope In: Scope Out: Findings:                 There is a short, widely patent stricture  at the GE                            junction with circumferential inflammation                            consistent with esophagitis. Biopsies were taken                            with a cold forceps for histology.                           A medium-sized hiatal hernia was present. No                            Cameron's ulcers seen.                           Patchy minimal inflammation characterized by                            erythema, friability and granularity was found in                            the gastric body. Biopsies were taken with a cold                            forceps for histology from the antrum, body, and                            fundus. Estimated blood loss was minimal. No                            gastric ulcer seen.                           Multiple small sessile polyps with no bleeding and                            no stigmata of recent bleeding were found in the                            gastric body. These have the appearance of fundic                            gland  polyps. Biopsies were taken with a cold                            forceps for histology.                           Patchy mildly erythematous mucosa without active                            bleeding and with no stigmata of bleeding  was found                            in the duodenal bulb. These findings were very                            mild. Biopsies were taken with a cold forceps for                            histology and to exclude celiac disease.                           The exam was otherwise without abnormality. Complications:            No immediate complications. Estimated blood loss:                            Minimal. Estimated Blood Loss:     Estimated blood loss was minimal. Impression:               - Widely-patent distal esophageal stricture with                            active esophagitis. Biopsied.                           - Medium-sized hiatal hernia. No Cameron's ulcers.                           - Gastritis. Biopsied.                           - Multiple gastric polyps. Likely fundic gland                            polyps. Biopsied.                           - Mild erythematous duodenopathy. Biopsied.                           - The examination was otherwise normal.                           - Although there are multiple mild findings, no                            obvious source to explain a hemoglobin of 5.4. Recommendation:           - Patient has a contact number available for                            emergencies. The signs and symptoms of potential                            delayed complications were discussed with the  patient. Return to normal activities tomorrow.                            Written discharge instructions were provided to the                            patient.                           - Resume regular diet today.                           - Continue present medications.                           - Start  pantoprazole 40 mg QAM.                           - Avoid all NSAIDs as you are able.                           - Await pathology results.                           - Proceed with capsule endoscopy for further                            evaluation. Thornton Park MD, MD 06/24/2019 8:53:59 AM This report has been signed electronically.

## 2019-06-28 ENCOUNTER — Other Ambulatory Visit: Payer: Self-pay

## 2019-06-28 ENCOUNTER — Telehealth: Payer: Self-pay

## 2019-06-28 DIAGNOSIS — D509 Iron deficiency anemia, unspecified: Secondary | ICD-10-CM

## 2019-06-28 NOTE — Telephone Encounter (Signed)
  Follow up Call-  Call back number 06/24/2019 05/24/2018  Post procedure Call Back phone  # [phone]  Permission to leave phone message Yes Yes  Some recent data might be hidden     Patient questions:  Do you have a fever, pain , or abdominal swelling? No. Pain Score  0 *  Have you tolerated food without any problems? Yes.    Have you been able to return to your normal activities? Yes.    Do you have any questions about your discharge instructions: Diet   No. Medications  No. Follow up visit  No.  Do you have questions or concerns about your Care? No.  Actions: * If pain score is 4 or above: No action needed, pain <4. 1. Have you developed a fever since your procedure? no  2.   Have you had an respiratory symptoms (SOB or cough) since your procedure? no  3.   Have you tested positive for COVID 19 since your procedure no  4.   Have you had any family members/close contacts diagnosed with the COVID 19 since your procedure?  no   If yes to any of these questions please route to Joylene John, RN and Alphonsa Gin, Therapist, sports.

## 2019-06-29 ENCOUNTER — Ambulatory Visit (HOSPITAL_COMMUNITY)
Admission: RE | Admit: 2019-06-29 | Discharge: 2019-06-29 | Disposition: A | Discharge status: Home / Self Care | Payer: Medicare Other | Source: Ambulatory Visit | Attending: Internal Medicine | Admitting: Internal Medicine

## 2019-06-29 ENCOUNTER — Other Ambulatory Visit: Payer: Self-pay

## 2019-06-29 DIAGNOSIS — D509 Iron deficiency anemia, unspecified: Secondary | ICD-10-CM | POA: Diagnosis present

## 2019-06-29 MED ORDER — SODIUM CHLORIDE 0.9 % IV SOLN
510.0000 mg | Freq: Once | INTRAVENOUS | Status: AC
  Administered 2019-06-29: 510 mg via INTRAVENOUS
  Filled 2019-06-29: qty 17

## 2019-06-29 MED ORDER — SODIUM CHLORIDE 0.9 % IV SOLN
INTRAVENOUS | Status: DC | PRN
  Administered 2019-06-29: 250 mL via INTRAVENOUS

## 2019-06-29 NOTE — Progress Notes (Signed)
                   [facility] Note   Diagnosis: Iron Deficiency   Provider: Nicoletta Ba, S. PA-C  Procedure: IV Feraheme   Note: Received IV Feraheme via PIV. Tolerated well. Monitor for 30 minutes post-transfusion. Vitals Stable. Discharge instruction given. Patient alert, oriented and ambulatory at time of discharge.  Otho Bellows, RN

## 2019-06-29 NOTE — Discharge Instructions (Signed)

## 2019-06-30 ENCOUNTER — Ambulatory Visit (INDEPENDENT_AMBULATORY_CARE_PROVIDER_SITE_OTHER): Payer: Medicare Other | Admitting: Gastroenterology

## 2019-06-30 ENCOUNTER — Encounter: Payer: Self-pay | Admitting: Gastroenterology

## 2019-06-30 DIAGNOSIS — D509 Iron deficiency anemia, unspecified: Secondary | ICD-10-CM

## 2019-06-30 DIAGNOSIS — K633 Ulcer of intestine: Secondary | ICD-10-CM

## 2019-06-30 DIAGNOSIS — I998 Other disorder of circulatory system: Secondary | ICD-10-CM

## 2019-06-30 HISTORY — DX: Ulcer of intestine: K63.3

## 2019-06-30 HISTORY — DX: Other disorder of circulatory system: I99.8

## 2019-06-30 NOTE — Progress Notes (Signed)
Pt here for capsule endoscopy. Pt swallowed capsule without difficulty. Pt aware of how to retreive capsule and return it to fedex location. Pt states she did complete prep last night.   Capsule serial number [id number] Exp:  10/14/20

## 2019-07-14 NOTE — Progress Notes (Signed)
Spoke with Nicoletta Ba, sending this to Dr. Hilarie Fredrickson for interpretation, this is his patient.

## 2019-07-18 ENCOUNTER — Other Ambulatory Visit (INDEPENDENT_AMBULATORY_CARE_PROVIDER_SITE_OTHER): Payer: Medicare Other

## 2019-07-18 ENCOUNTER — Other Ambulatory Visit: Payer: Self-pay

## 2019-07-18 DIAGNOSIS — D509 Iron deficiency anemia, unspecified: Secondary | ICD-10-CM

## 2019-07-18 LAB — CBC WITH DIFFERENTIAL/PLATELET
Basophils Absolute: 0.1 10*3/uL (ref 0.0–0.1)
Basophils Relative: 0.8 % (ref 0.0–3.0)
Eosinophils Absolute: 0.3 10*3/uL (ref 0.0–0.7)
Eosinophils Relative: 3.6 % (ref 0.0–5.0)
HCT: 39.9 % (ref 36.0–46.0)
Hemoglobin: 13.3 g/dL (ref 12.0–15.0)
Lymphocytes Relative: 35.9 % (ref 12.0–46.0)
Lymphs Abs: 2.8 10*3/uL (ref 0.7–4.0)
MCHC: 33.4 g/dL (ref 30.0–36.0)
MCV: 85.2 fl (ref 78.0–100.0)
Monocytes Absolute: 0.5 10*3/uL (ref 0.1–1.0)
Monocytes Relative: 6.5 % (ref 3.0–12.0)
Neutro Abs: 4.2 10*3/uL (ref 1.4–7.7)
Neutrophils Relative %: 53.2 % (ref 43.0–77.0)
Platelets: 207 10*3/uL (ref 150.0–400.0)
RBC: 4.68 Mil/uL (ref 3.87–5.11)
RDW: 25.2 % — ABNORMAL HIGH (ref 11.5–15.5)
WBC: 7.9 10*3/uL (ref 4.0–10.5)

## 2019-07-25 ENCOUNTER — Telehealth: Payer: Self-pay | Admitting: Internal Medicine

## 2019-07-25 NOTE — Telephone Encounter (Signed)
Video capsule results -- reviewed with patient by phone today.  1 small angioectasia Ulceration and inflammation in mid to distal small intestine, possible Crohn's  I called and spoke to her regarding these results.  Small bowel video capsule endoscopy performed for iron deficiency anemia after a fairly unremarkable upper endoscopy performed earlier this month by Dr. Tarri Glenn.  Patient also up-to-date with colonoscopy.  With the knowledge of small bowel inflammation and ulceration she does deny NSAID use.  She was previously on a baby aspirin leading up to her hospitalization where she was found to have a profound iron deficiency anemia.  She is received IV iron.  Blood counts have recovered.  She has been off of baby aspirin.  Overall she is feeling fairly well.  At times she does deal with loose bowels/stools occurring 2-3 times a day.  Occasionally she will have aching in her stomach.  No visible blood in her stool or melena.  No severe abdominal pain. Of note her husband died of Alzheimer's in June 13, 2023 and prior to this she had been his sole caregiver.  She reports that she now realizes how much stress she was under leading up to her hospitalization with the profound anemia.    No known family history of IBD.  We will proceed with the plan as follows --Patient should come for CRP, ESR and IBD extended panel (CBC was recently repeated so will not repeat at this time; repeat CBC and iron studies in about 4 to 6 weeks) --She should remain off all NSAIDs --Please get her an office follow-up with me, next available  Vaughan Basta please place the labs above, she knows to come for these studies.

## 2019-07-26 ENCOUNTER — Other Ambulatory Visit: Payer: Self-pay

## 2019-07-26 DIAGNOSIS — D509 Iron deficiency anemia, unspecified: Secondary | ICD-10-CM

## 2019-07-26 NOTE — Telephone Encounter (Signed)
Orders in for labs, reminder in for future labs. Pt scheduled to see Dr. Hilarie Fredrickson 08/09/19 at 2:30pm. Pt mailed appt letter.

## 2019-07-27 ENCOUNTER — Other Ambulatory Visit (INDEPENDENT_AMBULATORY_CARE_PROVIDER_SITE_OTHER): Payer: Medicare Other

## 2019-07-27 DIAGNOSIS — D509 Iron deficiency anemia, unspecified: Secondary | ICD-10-CM | POA: Diagnosis not present

## 2019-07-27 LAB — C-REACTIVE PROTEIN: CRP: <1 mg/dL (ref 0.5–20.0)

## 2019-07-27 LAB — SEDIMENTATION RATE: Sed Rate: 13 mm/hr (ref 0–30)

## 2019-07-28 NOTE — Progress Notes (Signed)
Subjective:    Patient ID: Virginia [name], female    DOB: 1940/12/31, 78 y.o.   MRN: [id number]  HPI She is here for a physical exam.   She is still dealing with her husbands death.  Her daughter and son are in the area.  She is having a hard time with this and when asked if she thinks she would like to speak with someone she did answer yes.  Her left foot hurts on the lateral dorsal aspect hurts when she walks.  This is been ongoing for over a year.  Her feet swell.  She is somewhat compliant with a low sodium diet.   Medications and allergies reviewed with patient and updated if appropriate.  Patient Active Problem List   Diagnosis Date Noted  . Acute on chronic anemia 05/25/2019  . DOE (dyspnea on exertion) 05/24/2019  . Tinnitus 05/24/2019  . Pain of toe of right foot 05/24/2019  . Caregiver stress 07/21/2018  . Left foot pain 05/13/2017  . Prediabetes 04/19/2016  . Constipation 04/16/2016  . Bilateral thumb pain 04/16/2016  . Dysphagia 10/17/2015  . Chronic kidney disease 08/13/2015  . Osteopenia 06/21/2014  . Back pain   . Sciatica of right side 05/12/2011  . 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 Osteoarthritis 01/01/2010  . FIBROIDS, UTERUS 12/13/2009  . Hyperlipidemia 12/13/2009  . Essential hypertension 12/13/2009  . ALLERGIC RHINITIS 12/13/2009  . COLONIC POLYPS, HX OF 12/13/2009    Current Outpatient Medications on File Prior to Visit  Medication Sig Dispense Refill  . fluticasone (FLONASE) 50 MCG/ACT nasal spray Place 2 sprays into both nostrils daily. 16 g 1  . loratadine (CLARITIN) 10 MG tablet Take 10 mg by mouth daily.    . metoprolol tartrate (LOPRESSOR) 50 MG tablet TAKE 1 TABLET BY MOUTH TWICE A DAY (Patient taking differently: Take 50 mg by mouth 2 (two) times daily. ) 180 tablet 1  . Multiple Vitamins-Minerals (AIRBORNE GUMMIES PO) Take 2 each by mouth daily.    . Omega-3 Fatty Acids (FISH OIL) 1000 MG CAPS Take 1,000 mg by mouth daily. 2000 mg in the  morning and 1000 mg at night    . pantoprazole (PROTONIX) 40 MG tablet Take 1 tablet (40 mg total) by mouth daily. 90 tablet 3   No current facility-administered medications on file prior to visit.     Past Medical History:  Diagnosis Date  . ALLERGIC RHINITIS   . Allergy   . Anemia   . Blood transfusion without reported diagnosis   . Cataract    beginning stage in lt. eye  . DIVERTICULITIS, HX OF   . FIBROIDS, UTERUS    s/p fibroidectomy  . GERD   . HYPERLIPIDEMIA    intol of statins (myalgia, thumb pain)  . HYPERTENSION   . OSTEOARTHRITIS   . Osteopenia 06/21/2014   DEXA@LB  06/14/14: -1.6  . 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 child birth  24 & 21  . COLONOSCOPY    . Fibroidectomy    . Low back surgery  01/1994   2 bulging disc  . POLYPECTOMY    . TONSILLECTOMY  1968  . UPPER GASTROINTESTINAL ENDOSCOPY      Social History   Socioeconomic History  . Marital status: Widowed    Spouse name: Not on file  . Number of children: 2  . Years of education: Not on file  . Highest education  level: Not on file  Occupational History  . Occupation: retired  Scientific laboratory technician  . Financial resource strain: Not hard at all  . Food insecurity    Worry: Never true    Inability: Never true  . Transportation needs    Medical: No    Non-medical: No  Tobacco Use  . Smoking status: Never Smoker  . Smokeless tobacco: Never Used  . 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 Alcohol use: No    Alcohol/week: 0.0 standard drinks  . Drug use: No  . Sexual activity: Not Currently  Lifestyle  . Physical activity    Days per week: 0 days    Minutes per session: 0 min  . Stress: To some extent  Relationships  . Social connections    Talks on phone: More than three times a week    Gets together: More than three times a week    Attends religious service:  More than 4 times per year    Active member of club or organization: Yes    Attends meetings of clubs or organizations: 1 to 4 times per year    Relationship status: Married  Other Topics Concern  . Not on file  Social History Narrative    2 children;   One in [place] and one is in [place]   Retired Pharmacist, hospital    Family History  Problem Relation Age of Onset  . Arthritis Mother   . Coronary artery disease Father   . Hyperlipidemia Brother   . Diabetes Maternal Aunt   . Hyperlipidemia Cousin   . Heart disease Cousin   . Diabetes Paternal Aunt   . Colon cancer Neg Hx   . Colon polyps Neg Hx   . Esophageal cancer Neg Hx   . Stomach cancer Neg Hx   . Rectal cancer Neg Hx     Review of Systems  Constitutional: Negative for appetite change, chills and fever.  Eyes: Negative for visual disturbance.  Respiratory: Negative for cough, shortness of breath and wheezing.   Cardiovascular: Positive for leg swelling (foot swelling). Negative for chest pain and palpitations.  Gastrointestinal: Positive for diarrhea (loose stool). Negative for abdominal pain, blood in stool (no melena), constipation and nausea.       GERD controlled  Genitourinary: Negative for dysuria and hematuria.  Musculoskeletal: Positive for back pain (sees chiropractor). Negative for arthralgias.       Foot pain  Skin: Negative for color change and rash.  Neurological: Negative for light-headedness and headaches.  Psychiatric/Behavioral: Positive for dysphoric mood (grieving hospital). Negative for sleep disturbance. The patient is not nervous/anxious.        Objective:   Vitals:   07/29/19 1102  BP: (!) 144/82  Pulse: 63  Resp: 16  Temp: 98.8 F (37.1 C)  SpO2: 97%   Filed Weights   07/29/19 1102  Weight: 184 lb 12.8 oz (83.8 kg)   Body mass index is 33.8 kg/m.  BP Readings from Last 3 Encounters:  07/29/19 (!) 144/82  07/29/19 (!) 144/82  06/29/19 (!) 158/68    Wt Readings from Last 3 Encounters:   07/29/19 184 lb (83.5 kg)  07/29/19 184 lb 12.8 oz (83.8 kg)  06/24/19 182 lb (82.6 kg)     Physical Exam Constitutional: She appears well-developed and well-nourished. No distress.  HENT:  Head: Normocephalic and atraumatic.  Right Ear: External ear normal. Normal ear canal and TM Left Ear:  External ear normal.  Normal ear canal and TM Mouth/Throat: Oropharynx is clear and moist.  Eyes: Conjunctivae and EOM are normal.  Neck: Neck supple. No tracheal deviation present. No thyromegaly present.  No carotid bruit  Cardiovascular: Normal rate, regular rhythm and normal heart sounds.   No murmur heard.  Trace bilateral dorsal foot swelling . Pulmonary/Chest: Effort normal and breath sounds normal. No respiratory distress. She has no wheezes. She has no rales.  Breast: deferred   Abdominal: Soft. She exhibits no distension. There is no tenderness.  Lymphadenopathy: She has no cervical adenopathy.  Skin: Skin is warm and dry. She is not diaphoretic.  Psychiatric: She has a normal mood and affect. Her behavior is normal.        Assessment & Plan:   Physical exam: Screening blood work deferred Immunizations  tdap discussed,  Flu today Colonoscopy   Up to date  Mammogram   Up to date  Gyn  No longer seeing Dexa   Due - deferred until next visit Eye exams  Up to date  Exercise  none Weight   encouraged weight loss Skin   No concerns Substance abuse  none  See Problem List for Assessment and Plan of chronic medical problems.  Follow-up in 6 months

## 2019-07-28 NOTE — Patient Instructions (Addendum)
All other Health Maintenance issues reviewed.   All recommended immunizations and age-appropriate screenings are up-to-date or discussed.  Flu immunization administered today.    Medications reviewed and updated.  Changes include :   none    Please followup in 6 months   Therapists: [place] [facility] [place] [facility] [place]  [place], [place]  [facility] [place], [place], [place], [address] [phone] Office [phone] [facility]            [place], [place] Maintenance, Female Adopting a healthy lifestyle and getting preventive care are important in promoting health and wellness. Ask your health care provider about:  The right schedule for you to have regular tests and exams.  Things you can do on your own to prevent diseases and keep yourself healthy. What should I know about diet, weight, and exercise? Eat a healthy diet   Eat a diet that includes plenty of vegetables, fruits, low-fat dairy products, and lean protein.  Do not eat a lot of foods that are high in solid fats, added sugars, or sodium. Maintain a healthy weight Body mass index (BMI) is used to identify weight problems. It estimates body fat based on height and weight. Your health care provider can help determine your BMI and help you achieve or maintain a healthy weight. Get regular exercise Get regular exercise. This is one of the most important things you can do for your health. Most adults should:  Exercise for at least 150 minutes each week. The exercise should increase your heart rate and make you sweat (moderate-intensity exercise).  Do strengthening  exercises at least twice a week. This is in addition to the moderate-intensity exercise.  Spend less time sitting. Even light physical activity can be beneficial. Watch cholesterol and blood lipids Have your blood tested for lipids and cholesterol at 76 years of age, then have this test every 5 years. Have your cholesterol levels checked more often if:  Your lipid or cholesterol levels are high.  You are older than 76 years of age.  You are at high risk for heart disease. What should I know about cancer screening? Depending on your health history and family history, you may need to have cancer screening at various ages. This may include screening for:  Breast cancer.  Cervical cancer.  Colorectal cancer.  Skin cancer.  Lung cancer. What should I know about heart disease, diabetes, and high blood pressure? Blood pressure and heart disease  High blood pressure causes heart disease and increases the risk of stroke. This is more likely to develop in people who have high blood pressure readings, are of African descent, or are overweight.  Have your blood pressure checked: ? Every 3-5 years if you are 43-60 years of age. ? Every year if you are 66 years old or older. Diabetes Have regular diabetes screenings. This checks your fasting blood sugar level. Have the screening done:  Once every three years after age 67 if you are at a normal weight and have a low risk for diabetes.  More often and at a younger age if you are overweight or have a high risk for diabetes. What should I know about preventing infection? Hepatitis B If you  have a higher risk for hepatitis B, you should be screened for this virus. Talk with your health care provider to find out if you are at risk for hepatitis B infection. Hepatitis C Testing is recommended for:  Everyone born from [birth year] through 1963.  Anyone with known risk factors for hepatitis C. Sexually transmitted infections (STIs)  Get screened  for STIs, including gonorrhea and chlamydia, if: ? You are sexually active and are younger than 76 years of age. ? You are older than 76 years of age and your health care provider tells you that you are at risk for this type of infection. ? Your sexual activity has changed since you were last screened, and you are at increased risk for chlamydia or gonorrhea. Ask your health care provider if you are at risk.  Ask your health care provider about whether you are at high risk for HIV. Your health care provider may recommend a prescription medicine to help prevent HIV infection. If you choose to take medicine to prevent HIV, you should first get tested for HIV. You should then be tested every 3 months for as long as you are taking the medicine. Pregnancy  If you are about to stop having your period (premenopausal) and you may become pregnant, seek counseling before you get pregnant.  Take 400 to 800 micrograms (mcg) of folic acid every day if you become pregnant.  Ask for birth control (contraception) if you want to prevent pregnancy. Osteoporosis and menopause Osteoporosis is a disease in which the bones lose minerals and strength with aging. This can result in bone fractures. If you are 28 years old or older, or if you are at risk for osteoporosis and fractures, ask your health care provider if you should:  Be screened for bone loss.  Take a calcium or vitamin D supplement to lower your risk of fractures.  Be given hormone replacement therapy (HRT) to treat symptoms of menopause. Follow these instructions at home: Lifestyle  Do not use any products that contain nicotine or tobacco, such as cigarettes, e-cigarettes, and chewing tobacco. If you need help quitting, ask your health care provider.  Do not use street drugs.  Do not share needles.  Ask your health care provider for help if you need support or information about quitting drugs. Alcohol use  Do not drink alcohol if: ? Your  health care provider tells you not to drink. ? You are pregnant, may be pregnant, or are planning to become pregnant.  If you drink alcohol: ? Limit how much you use to 0-1 drink a day. ? Limit intake if you are breastfeeding.  Be aware of how much alcohol is in your drink. In the [place] one drink equals one 12 oz bottle of beer (355 mL), one 5 oz glass of wine (148 mL), or one 1 oz glass of hard liquor (44 mL). General instructions  Schedule regular health, dental, and eye exams.  Stay current with your vaccines.  Tell your health care provider if: ? You often feel depressed. ? You have ever been abused or do not feel safe at home. Summary  Adopting a healthy lifestyle and getting preventive care are important in promoting health and wellness.  Follow your health care provider's instructions about healthy diet, exercising, and getting tested or screened for diseases.  Follow your health care provider's instructions on monitoring your cholesterol and blood pressure. This information is not intended to replace advice given to you by your health care provider. Make  sure you discuss any questions you have with your health care provider. Document Released: 06/02/2011 Document Revised: 11/10/2018 Document Reviewed: 11/10/2018 Elsevier Patient Education  2020 [employer].

## 2019-07-29 ENCOUNTER — Other Ambulatory Visit: Payer: Self-pay | Admitting: Internal Medicine

## 2019-07-29 ENCOUNTER — Ambulatory Visit (INDEPENDENT_AMBULATORY_CARE_PROVIDER_SITE_OTHER): Payer: Medicare Other | Admitting: Internal Medicine

## 2019-07-29 ENCOUNTER — Other Ambulatory Visit: Payer: Self-pay

## 2019-07-29 ENCOUNTER — Encounter: Payer: Self-pay | Admitting: Internal Medicine

## 2019-07-29 ENCOUNTER — Ambulatory Visit (INDEPENDENT_AMBULATORY_CARE_PROVIDER_SITE_OTHER): Payer: Medicare Other | Admitting: *Deleted

## 2019-07-29 VITALS — BP 144/82 | HR 63 | Temp 98.8°F | Resp 16 | Ht 62.0 in | Wt 184.8 lb

## 2019-07-29 VITALS — BP 144/82 | HR 63 | Resp 16 | Ht 62.0 in | Wt 184.0 lb

## 2019-07-29 DIAGNOSIS — I10 Essential (primary) hypertension: Secondary | ICD-10-CM | POA: Diagnosis not present

## 2019-07-29 DIAGNOSIS — Z0000 Encounter for general adult medical examination without abnormal findings: Secondary | ICD-10-CM | POA: Diagnosis not present

## 2019-07-29 DIAGNOSIS — E7849 Other hyperlipidemia: Secondary | ICD-10-CM

## 2019-07-29 DIAGNOSIS — N189 Chronic kidney disease, unspecified: Secondary | ICD-10-CM | POA: Diagnosis not present

## 2019-07-29 DIAGNOSIS — D649 Anemia, unspecified: Secondary | ICD-10-CM

## 2019-07-29 DIAGNOSIS — M8589 Other specified disorders of bone density and structure, multiple sites: Secondary | ICD-10-CM

## 2019-07-29 DIAGNOSIS — R7303 Prediabetes: Secondary | ICD-10-CM

## 2019-07-29 DIAGNOSIS — Z23 Encounter for immunization: Secondary | ICD-10-CM | POA: Diagnosis not present

## 2019-07-29 LAB — IBD EXPANDED PANEL
ACCA: 70 units (ref 0–90)
ALCA: 10 units (ref 0–60)
AMCA: 19 units (ref 0–100)
Atypical pANCA: NEGATIVE
gASCA: 16 units (ref 0–50)

## 2019-07-29 NOTE — Patient Instructions (Addendum)
If you cannot attend class in person, you can still exercise at home. Video taped versions of AHOY classes are shown on [employer] (GTN) at 8 am and 1 pm Mondays through Fridays. You can also purchase a copy of the AHOY DVD by calling [place] (GTN) [employer]. GTN is available on [employer] channel 13 with a digital cable box and on [employer] channel 31. GTN is also available on [employer], channel 99. To view GTN, go to channel 99, press OK, select [place], then select GTN to start the channel.  Continue doing brain stimulating activities (puzzles, reading, adult coloring books, staying active) to keep memory sharp.   Continue to eat heart healthy diet (full of fruits, vegetables, whole grains, lean protein, water--limit salt, fat, and sugar intake) and increase physical activity as tolerated.   Virginia [name] , Thank you for taking time to come for your Medicare Wellness Visit. I appreciate your ongoing commitment to your health goals. Please review the following plan we discussed and let me know if I can assist you in the future.   These are the goals we discussed: Goals    . Patient Stated     Increase my physical activity and monitor my diet to be healthier.       This is a list of the screening recommended for you and due dates:  Health Maintenance  Topic Date Due  . Tetanus Vaccine  10/01/2016  . Flu Shot  07/02/2019  . DEXA scan (bone density measurement)  07/28/2020*  . Colon Cancer Screening  05/24/2021  . Pneumonia vaccines  Completed  *Topic was postponed. The date shown is not the original due date.

## 2019-07-29 NOTE — Assessment & Plan Note (Signed)
Last A1c was reasonable-we will defer blood work and recheck in 6 months Low sugar / carb diet Stressed regular exercise

## 2019-07-29 NOTE — Assessment & Plan Note (Signed)
Has been stable Will defer blood work at this time

## 2019-07-29 NOTE — Assessment & Plan Note (Signed)
Taking fenofibric acid Lipids reasonably controlled at her last visit and we will defer blood work for today and recheck at her next visit Encouraged regular exercise

## 2019-07-29 NOTE — Assessment & Plan Note (Signed)
Will defer DEXA-we will discuss at her next visit Encouraged regular exercise

## 2019-07-29 NOTE — Assessment & Plan Note (Signed)
Recent blood counts normal Has follow up with GI

## 2019-07-29 NOTE — Assessment & Plan Note (Signed)
Still having left lateral foot pain that is been going on for over a year We will make an appointment with orthopedics.

## 2019-07-29 NOTE — Assessment & Plan Note (Signed)
Blood pressure reasonably controlled Continue current medication at current dose We will follow-up in 6 months Recent CMP reviewed

## 2019-07-29 NOTE — Progress Notes (Addendum)
Subjective:   Virginia [name] is a 78 y.o. female who presents for Medicare Annual (Subsequent) preventive examination.  Review of Systems:   Cardiac Risk Factors include: advanced age (>75men, >46 women);hypertension;obesity (BMI >30kg/m2) Sleep patterns: feels rested on waking, gets up 0-1 times nightly to void and sleeps 7-8 hours nightly.    Home Safety/Smoke Alarms: Feels safe in home. Smoke alarms in place.  Living environment; residence and Firearm Safety: 1-story house/ trailer. Lives alone, no needs for DME, good support system Seat Belt Safety/Bike Helmet: Wears seat belt.     Objective:     Vitals: There were no vitals taken for this visit.  There is no height or weight on file to calculate BMI.  Advanced Directives 07/29/2019 05/25/2019 05/25/2019 05/24/2018 05/19/2018 05/03/2015  Does Patient Have a Medical Advance Directive? Yes Yes Yes Yes Yes No  Type of Paramedic of [place];Living will Healthcare Power of [place];Living will [place];Living will [place];Living will -  Does patient want to make changes to medical advance directive? - No - Patient declined - - - -  Copy of [place] in Chart? No - copy requested No - copy requested - - - -  Would patient like information on creating a medical advance directive? 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 ALLERGIC RHINITIS   . Allergy   . Anemia   . Blood transfusion without reported diagnosis   . Cataract    beginning stage in lt. eye  . DIVERTICULITIS, HX OF   . FIBROIDS, UTERUS     s/p fibroidectomy  . GERD   . HYPERLIPIDEMIA    intol of statins (myalgia, thumb pain)  . HYPERTENSION   . OSTEOARTHRITIS   . Osteopenia 06/21/2014   DEXA@LB  06/14/14: -1.6  . 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 child birth  25 & 104  . COLONOSCOPY    . Fibroidectomy    . Low back surgery  01/1994   2 bulging disc  . POLYPECTOMY    . TONSILLECTOMY  1968  . UPPER GASTROINTESTINAL ENDOSCOPY     Family History  Problem Relation Age of Onset  . Arthritis Mother   . Coronary artery disease Father   . Hyperlipidemia Brother   . Diabetes Maternal Aunt   . Hyperlipidemia Cousin   . Heart disease Cousin   . Diabetes Paternal Aunt   . Colon cancer Neg Hx   . Colon polyps Neg Hx   . Esophageal cancer Neg Hx   . Stomach cancer Neg Hx   . Rectal cancer Neg Hx    Social History   Socioeconomic History  . Marital status: Widowed    Spouse name: Not on file  . Number of children: 2  . Years of education: Not on file  . Highest education level: Not on file  Occupational History  . Occupation: retired  Scientific laboratory technician  . Financial resource strain: Not hard at all  . Food insecurity    Worry: Never true  Inability: Never true  . Transportation needs    Medical: No    Non-medical: No  Tobacco Use  . Smoking status: Never Smoker  . Smokeless tobacco: Never Used  . 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 Alcohol use: No    Alcohol/week: 0.0 standard drinks  . Drug use: No  . Sexual activity: Not Currently  Lifestyle  . Physical activity    Days per week: 0 days    Minutes per session: 0 min  . Stress: To some extent  Relationships  . Social connections    Talks on phone: More than three times a week    Gets together: More than three times a week    Attends religious service: More than 4 times per year    Active member of club or  organization: Yes    Attends meetings of clubs or organizations: 1 to 4 times per year    Relationship status: Married  Other Topics Concern  . Not on file  Social History Narrative    2 children;   One in [place] and one is in [place]   Retired Pharmacist, hospital    Outpatient Encounter Medications as of 07/29/2019  Medication Sig  . Choline Fenofibrate (FENOFIBRIC ACID) 135 MG CPDR Take 135 mg by mouth daily.  . fluticasone (FLONASE) 50 MCG/ACT nasal spray Place 2 sprays into both nostrils daily.  Marland Kitchen loratadine (CLARITIN) 10 MG tablet Take 10 mg by mouth daily.  . metoprolol tartrate (LOPRESSOR) 50 MG tablet TAKE 1 TABLET BY MOUTH TWICE A DAY (Patient taking differently: Take 50 mg by mouth 2 (two) times daily. )  . Multiple Vitamins-Minerals (AIRBORNE GUMMIES PO) Take 2 each by mouth daily.  . Omega-3 Fatty Acids (FISH OIL) 1000 MG CAPS Take 1,000 mg by mouth daily. 2000 mg in the morning and 1000 mg at night  . pantoprazole (PROTONIX) 40 MG tablet Take 1 tablet (40 mg total) by mouth daily.  . [DISCONTINUED] Choline Fenofibrate (FENOFIBRIC ACID) 135 MG CPDR Take 1 tablet by mouth daily. (Patient taking differently: Take 135 mg by mouth daily. )  . [DISCONTINUED] Ferrous Sulfate (IRON HIGH-POTENCY) 325 MG TABS Take 325 mg by mouth daily.   No facility-administered encounter medications on file as of 07/29/2019.     Activities of Daily Living In your present state of health, do you have any difficulty performing the following activities: 07/29/2019 05/25/2019  Hearing? N N  Vision? N N  Difficulty concentrating or making decisions? N N  Walking or climbing stairs? N N  Dressing or bathing? N N  Doing errands, shopping? N N  Preparing Food and eating ? N -  Using the Toilet? N -  In the past six months, have you accidently leaked urine? N -  Do you have problems with loss of bowel control? N -  Managing your Medications? N -  Managing your Finances? N -  Housekeeping or managing your  Housekeeping? N -  Some recent data might be hidden    Patient Care Team: [name], MD as PCP - General (Internal Medicine) Wylene Simmer, MD as Consulting Physician (Orthopedic Surgery) Pyrtle, Lajuan Lines, MD as Consulting Physician (Gastroenterology)    Assessment:   This is a routine wellness examination for Virginia [name]. Physical assessment deferred to PCP.   Exercise Activities and Dietary recommendations Current Exercise Habits: The patient does not participate in regular exercise at present, Intensity: Mild, Exercise limited  by: None identified [facility] senior exercise TV program resource provided.  Diet (meal preparation, eat out, water intake, caffeinated beverages, dairy products, fruits and vegetables): in general, a "healthy" diet  , well balanced   Reviewed heart healthy diet. Discussed weight loss tips and diet. Encouraged patient to increase daily water and healthy fluid intake. Relevant patient education assigned to patient using Emmi.   Goals    . Patient Stated     Increase my physical activity and monitor my diet to be healthier.       Fall Risk Fall Risk  07/29/2019 01/26/2019 05/19/2018 12/16/2017 11/12/2016  Falls in the past year? 0 0 No No No  Number falls in past yr: 0 - - - -  Injury with Fall? 0 - - - -    Depression Screen PHQ 2/9 Scores 07/29/2019 01/26/2019 05/19/2018 12/16/2017  PHQ - 2 Score 1 0 1 0  PHQ- 9 Score - - 2 -     Cognitive Function MMSE - Mini Mental State Exam 05/03/2015  Not completed: Unable to complete       Ad8 score reviewed for issues:  Issues making decisions: no  Less interest in hobbies / activities: no  Repeats questions, stories (family complaining): no  Trouble using ordinary gadgets (microwave, computer, phone):no  Forgets the month or year: no  Mismanaging finances: no  Remembering appts: no  Daily problems with thinking and/or memory: no Ad8 score is= 0  Immunization History  Administered Date(s)  Administered  . Influenza Split 10/01/2012  . Influenza Whole 10/01/2009  . Influenza, High Dose Seasonal PF 08/31/2013, 10/20/2014, 10/17/2015, 11/12/2016, 12/16/2017  . Influenza-Unspecified 10/04/2018  . Pneumococcal Conjugate-13 08/16/2015  . Pneumococcal Polysaccharide-23 05/13/2017  . Td 10/01/2006  . Zoster 02/09/2008   Screening Tests Health Maintenance  Topic Date Due  . TETANUS/TDAP  10/01/2016  . INFLUENZA VACCINE  07/02/2019  . DEXA SCAN  07/28/2020 (Originally 06/14/2017)  . COLONOSCOPY  05/24/2021  . PNA vac Low Risk Adult  Completed      Plan:    Reviewed health maintenance screenings with patient today and relevant education, vaccines, and/or referrals were provided.   I have personally reviewed and noted the following in the patient's chart:   . Medical and social history . Use of alcohol, tobacco or illicit drugs  . Current medications and supplements . Functional ability and status . Nutritional status . Physical activity . Advanced directives . List of other physicians . Vitals . Screenings to include cognitive, depression, and falls . Referrals and appointments  In addition, I have reviewed and discussed with patient certain preventive protocols, quality metrics, and best practice recommendations. A written personalized care plan for preventive services as well as general preventive health recommendations were provided to patient.     Michiel Cowboy, RN  07/29/2019   Medical screening examination/treatment/procedure(s) were performed by non-physician practitioner and as supervising physician I was immediately available for consultation/collaboration. I agree with above. [name], MD

## 2019-08-01 ENCOUNTER — Encounter: Payer: Self-pay | Admitting: *Deleted

## 2019-08-02 ENCOUNTER — Telehealth: Payer: Self-pay

## 2019-08-02 NOTE — Telephone Encounter (Signed)
Spoke with pt and she is aware of capsule endo results per Dr. Hilarie Fredrickson and knows to keep her OV as scheduled. States she has not been using NSAIDS.

## 2019-08-09 ENCOUNTER — Ambulatory Visit: Payer: Medicare Other | Admitting: Internal Medicine

## 2019-08-15 ENCOUNTER — Ambulatory Visit (INDEPENDENT_AMBULATORY_CARE_PROVIDER_SITE_OTHER): Payer: Medicare Other | Admitting: Physician Assistant

## 2019-08-15 ENCOUNTER — Encounter: Payer: Self-pay | Admitting: Physician Assistant

## 2019-08-15 ENCOUNTER — Other Ambulatory Visit (INDEPENDENT_AMBULATORY_CARE_PROVIDER_SITE_OTHER): Payer: Medicare Other

## 2019-08-15 VITALS — BP 136/70 | HR 60 | Temp 97.9°F | Ht 62.0 in | Wt 186.0 lb

## 2019-08-15 DIAGNOSIS — D509 Iron deficiency anemia, unspecified: Secondary | ICD-10-CM

## 2019-08-15 LAB — CBC
HCT: 40.6 % (ref 36.0–46.0)
Hemoglobin: 13.5 g/dL (ref 12.0–15.0)
MCHC: 33.3 g/dL (ref 30.0–36.0)
MCV: 86 fl (ref 78.0–100.0)
Platelets: 200 10*3/uL (ref 150.0–400.0)
RBC: 4.73 Mil/uL (ref 3.87–5.11)
RDW: 13.4 % (ref 11.5–15.5)
WBC: 7.4 10*3/uL (ref 4.0–10.5)

## 2019-08-15 LAB — IBC + FERRITIN
Ferritin: 17.8 ng/mL (ref 10.0–291.0)
Iron: 62 ug/dL (ref 42–145)
Saturation Ratios: 13.8 % — ABNORMAL LOW (ref 20.0–50.0)
Transferrin: 321 mg/dL (ref 212.0–360.0)

## 2019-08-15 NOTE — Progress Notes (Signed)
Chief Complaint: Follow-up IDA  HPI:    Virginia [name] is a 78 year old Caucasian female with a past medical history as listed below, known to Dr. Hilarie Fredrickson for her iron deficiency anemia who presents to clinic today for follow-up.\    05/24/2018 colonoscopy Dr. Hilarie Fredrickson with one 1 mm polyp at the ileocecal valve, 1 4 mm polyp in the transverse colon, 1 5 mm polyp in the descending colon, diverticulosis in the sigmoid colon and small internal hemorrhoids.  Pathology with tubular adenomas.  Repeat recommended in 3 years.    06/22/2019 initial evaluation for severe iron deficiency anemia.  She was found to have a hemoglobin of 5.4 in June and was admitted to the hospital service.  She received 2 units of PRBCs and 1 Feraheme infusion.  Hemoccult negative.  Serum iron was 15, TIBC 517, iron saturation 3 and ferritin of 3.  At that time scheduled for second Feraheme infusion.  Repeated CBC check TTG and IgA and TSH.  Scheduled for upper endoscopy with small bowel biopsy with Dr. Tarri Glenn as well as capsule endoscopy.    06/24/2019 EGD with Dr. Tarri Glenn. Widely patent esophageal stricture, medium sized hiatal hernia, gastritis, multiple gastric polyps and mild erythematous duodenopathy.    06/30/2019 capsule endoscopy with 1 small angiectasia at 1 hour and 6 minutes and inflammatory changes with at least 2 large ulcerations in the mid to distal small bowel.  Repeat CBC and iron studies were ordered.  Ensure patient is not using NSAIDs.  Check CRP, ESR and IBD panel.  These labs were negative.     07/18/2019 hemoglobin 13.3.    Today, the patient tells me that she is doing well.  She blames a lot of her symptoms on stress that she was under while caring for her husband with Alzheimer's before he passed away this summer.  She does tell me she experiences loose stools about once a week.  She will typically have 3 loose stools in a row over a couple of hours and then is completely normal the rest of the time.    Does tell  me she has had a 10 pound weight gain over the summer and does not feel as though she has eaten 10 pounds worth of extra calories.    Denies fever, chills, blood in her stool, fatigue or shortness of breath.     Past Medical History:  Diagnosis Date  . ALLERGIC RHINITIS   . Allergy   . Anemia   . Angiectasia 06/30/2019   small- in the small bowel  . Blood transfusion without reported diagnosis   . Cataract    beginning stage in lt. eye  . Chronic kidney disease   . Diverticulosis   . FIBROIDS, UTERUS    s/p fibroidectomy  . GERD   . HYPERLIPIDEMIA    intol of statins (myalgia, thumb pain)  . HYPERTENSION   . Intestinal ulcer 06/30/2019   2 large ulcerations in the mid to distal small bowel (seen on capsule endoscopy)  . OSTEOARTHRITIS   . Osteopenia 06/21/2014   DEXA'@LB'$  06/14/14: -1.6  . 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 Tubular adenoma of colon     Past Surgical History:  Procedure Laterality Date  . child birth  6 & 35  . COLONOSCOPY    . Fibroidectomy    . Low back surgery  01/1994   2 bulging disc  . POLYPECTOMY    . TONSILLECTOMY  1968  . UPPER GASTROINTESTINAL  ENDOSCOPY      Current Outpatient Medications  Medication Sig Dispense Refill  . Choline Fenofibrate (FENOFIBRIC ACID) 135 MG CPDR Take 135 mg by mouth daily. 90 capsule 0  . fluticasone (FLONASE) 50 MCG/ACT nasal spray Place 2 sprays into both nostrils daily. 16 g 1  . loratadine (CLARITIN) 10 MG tablet Take 10 mg by mouth daily.    . metoprolol tartrate (LOPRESSOR) 50 MG tablet TAKE 1 TABLET BY MOUTH TWICE A DAY (Patient taking differently: Take 50 mg by mouth 2 (two) times daily. ) 180 tablet 1  . Multiple Vitamins-Minerals (AIRBORNE GUMMIES PO) Take 2 each by mouth daily.    . Omega-3 Fatty Acids (FISH OIL) 1000 MG CAPS Take 1,000 mg by mouth daily. 2000 mg in the morning and 1000 mg at night    . pantoprazole (PROTONIX) 40 MG tablet Take 1 tablet (40 mg total) by mouth daily. 90 tablet  3   No current facility-administered medications for this visit.     Allergies as of 08/15/2019 - Review Complete 07/29/2019  Allergen Reaction Noted  . Penicillins Hives   . Statins Other (See Comments) 12/13/2009    Family History  Problem Relation Age of Onset  . Arthritis Mother   . Coronary artery disease Father   . Hyperlipidemia Brother   . Diabetes Maternal Aunt   . Hyperlipidemia Cousin   . Heart disease Cousin   . Diabetes Paternal Aunt   . Colon cancer Neg Hx   . Colon polyps Neg Hx   . Esophageal cancer Neg Hx   . Stomach cancer Neg Hx   . Rectal cancer Neg Hx     Social History   Socioeconomic History  . Marital status: Widowed    Spouse name: Not on file  . Number of children: 2  . Years of education: Not on file  . Highest education level: Not on file  Occupational History  . Occupation: retired  Scientific laboratory technician  . Financial resource strain: Not hard at all  . Food insecurity    Worry: Never true    Inability: Never true  . Transportation needs    Medical: No    Non-medical: No  Tobacco Use  . Smoking status: Never Smoker  . Smokeless tobacco: Never Used  . 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 Alcohol use: No    Alcohol/week: 0.0 standard drinks  . Drug use: No  . Sexual activity: Not Currently  Lifestyle  . Physical activity    Days per week: 0 days    Minutes per session: 0 min  . Stress: To some extent  Relationships  . Social connections    Talks on phone: More than three times a week    Gets together: More than three times a week    Attends religious service: More than 4 times per year    Active member of club or organization: Yes    Attends meetings of clubs or organizations: 1 to 4 times per year    Relationship status: Married  . Intimate partner violence    Fear of current or ex partner: Not on file    Emotionally abused: Not on file    Physically abused:  Not on file    Forced sexual activity: Not on file  Other Topics Concern  . Not on file  Social History Narrative    2 children;   One in [place] and one is  in [place]   Retired Pharmacist, hospital    Review of Systems:    Constitutional: No weight loss, fever or chills Cardiovascular: No chest pain Respiratory: No SOB  Gastrointestinal: See HPI and otherwise negative   Physical Exam:  Vital signs: BP 136/70   Pulse 60   Temp 97.9 F (36.6 C) (Oral)   Ht '5\' 2"'$  (1.575 m)   Wt 186 lb (84.4 kg)   BMI 34.02 kg/m   Constitutional:   Pleasant Caucasian female appears to be in NAD, Well developed, Well nourished, alert and cooperative Respiratory: Respirations even and unlabored. Lungs clear to auscultation bilaterally.   No wheezes, crackles, or rhonchi.  Cardiovascular: Normal S1, S2. No MRG. Regular rate and rhythm. No peripheral edema, cyanosis or pallor.  Gastrointestinal:  Soft, nondistended, nontender. No rebound or guarding. Normal bowel sounds. No appreciable masses or hepatomegaly. Psychiatric: Demonstrates good judgement and reason without abnormal affect or behaviors.  MOST RECENT LABS AND IMAGING: CBC    Component Value Date/Time   WBC 7.9 07/18/2019 1350   RBC 4.68 07/18/2019 1350   HGB 13.3 07/18/2019 1350   HCT 39.9 07/18/2019 1350   PLT 207.0 07/18/2019 1350   MCV 85.2 07/18/2019 1350   MCH 19.1 (L) 05/26/2019 0545   MCHC 33.4 07/18/2019 1350   RDW 25.2 (H) 07/18/2019 1350   LYMPHSABS 2.8 07/18/2019 1350   MONOABS 0.5 07/18/2019 1350   EOSABS 0.3 07/18/2019 1350   BASOSABS 0.1 07/18/2019 1350    CMP     Component Value Date/Time   NA 140 05/26/2019 0545   K 3.7 05/26/2019 0545   CL 109 05/26/2019 0545   CO2 25 05/26/2019 0545   GLUCOSE 105 (H) 05/26/2019 0545   BUN 16 05/26/2019 0545   CREATININE 0.84 05/26/2019 0545   CALCIUM 9.3 05/26/2019 0545   PROT 6.3 (L) 05/25/2019 1552   ALBUMIN 3.4 (L) 05/25/2019 1552   AST 12 (L) 05/25/2019 1552   ALT 12  05/25/2019 1552   ALKPHOS 37 (L) 05/25/2019 1552   BILITOT 0.4 05/25/2019 1552   GFRNONAA >60 05/26/2019 0545   GFRAA >60 05/26/2019 0545    Assessment: 1.  Iron deficiency anemia: Unremarkable colonoscopy and endoscopy, small bowel capsule endoscopy with ulcerations in the small bowel concerning for Crohn's disease, ESR, CRP and IBD panel negative/normal  Plan: 1.  Repeat CBC, iron studies and ferritin today. 2.  Discussed Budesonide with the patient.  She does not feel as though her symptoms of diarrhea are severe and would rather not be on a medication if she does not need it. 3.  Briefly discussed weight gain.  Explained to patient that likely she is just not using as much energy as she once was, especially during the pandemic.  She should make an effort to try and lose weight including increasing exercise and watching her calorie intake.  If she is not able to do so when trying, then would recommend she talk to her PCP. 4.  Discussed with patient that we will put in repeat CBC, iron studies and ferritin for 3 to 4 months from now.  Dr. Hilarie Fredrickson will follow with her in the future regarding her iron deficiency anemia.  Ellouise Newer, PA-C [facility] 08/15/2019, 2:02 PM  Cc: [name], MD

## 2019-08-15 NOTE — Patient Instructions (Signed)
If you are age 76 or older, your body mass index should be between 23-30. Your Body mass index is 34.02 kg/m. If this is out of the aforementioned range listed, please consider follow up with your Primary Care Provider.  If you are age 81 or younger, your body mass index should be between 19-25. Your Body mass index is 34.02 kg/m. If this is out of the aformentioned range listed, please consider follow up with your Primary Care Provider.    Your provider has requested that you go to the basement level for lab work before leaving today. Press "B" on the elevator. The lab is located at the first door on the left as you exit the elevator.   You will also need repeat labs in 3 months ( Dec 2020).   Thank you for choosing me and [facility].  Dennison Bulla

## 2019-08-31 NOTE — Progress Notes (Signed)
Addendum: Reviewed and agree with assessment and management plan. Lyndzie Zentz M, MD  

## 2019-11-01 ENCOUNTER — Telehealth: Payer: Self-pay

## 2019-11-01 NOTE — Telephone Encounter (Signed)
-----   Message from Hughie Closs, RN sent at 08/16/2019  9:27 AM EDT ----- Put order in Epic and call pt. For 3 month F/U of:  IBC, Ferritin, CBC

## 2019-11-01 NOTE — Telephone Encounter (Signed)
Called patient and asked her to come into our lab for 3 month repeat labs. She agreed

## 2019-11-04 ENCOUNTER — Other Ambulatory Visit: Payer: Self-pay | Admitting: Internal Medicine

## 2019-11-04 NOTE — Telephone Encounter (Signed)
Pt called stating she only has enough of the medications to last her until Monday morning. Please advise.

## 2019-11-09 ENCOUNTER — Other Ambulatory Visit (INDEPENDENT_AMBULATORY_CARE_PROVIDER_SITE_OTHER): Payer: Medicare Other

## 2019-11-09 DIAGNOSIS — D509 Iron deficiency anemia, unspecified: Secondary | ICD-10-CM | POA: Diagnosis not present

## 2019-11-09 LAB — CBC
HCT: 40.9 % (ref 36.0–46.0)
Hemoglobin: 13.7 g/dL (ref 12.0–15.0)
MCHC: 33.4 g/dL (ref 30.0–36.0)
MCV: 85.1 fl (ref 78.0–100.0)
Platelets: 208 10*3/uL (ref 150.0–400.0)
RBC: 4.81 Mil/uL (ref 3.87–5.11)
RDW: 15.1 % (ref 11.5–15.5)
WBC: 6.8 10*3/uL (ref 4.0–10.5)

## 2019-11-09 LAB — IBC + FERRITIN
Ferritin: 14.7 ng/mL (ref 10.0–291.0)
Iron: 142 ug/dL (ref 42–145)
Saturation Ratios: 30.2 % (ref 20.0–50.0)
Transferrin: 336 mg/dL (ref 212.0–360.0)

## 2019-11-10 ENCOUNTER — Other Ambulatory Visit: Payer: Self-pay

## 2019-11-10 DIAGNOSIS — D509 Iron deficiency anemia, unspecified: Secondary | ICD-10-CM

## 2019-12-05 DIAGNOSIS — M530 Cervicocranial syndrome: Secondary | ICD-10-CM | POA: Diagnosis not present

## 2019-12-05 DIAGNOSIS — S335XXA Sprain of ligaments of lumbar spine, initial encounter: Secondary | ICD-10-CM | POA: Diagnosis not present

## 2019-12-05 DIAGNOSIS — M9901 Segmental and somatic dysfunction of cervical region: Secondary | ICD-10-CM | POA: Diagnosis not present

## 2019-12-05 DIAGNOSIS — M62830 Muscle spasm of back: Secondary | ICD-10-CM | POA: Diagnosis not present

## 2019-12-05 DIAGNOSIS — M9903 Segmental and somatic dysfunction of lumbar region: Secondary | ICD-10-CM | POA: Diagnosis not present

## 2019-12-05 DIAGNOSIS — M9902 Segmental and somatic dysfunction of thoracic region: Secondary | ICD-10-CM | POA: Diagnosis not present

## 2019-12-13 DIAGNOSIS — M9901 Segmental and somatic dysfunction of cervical region: Secondary | ICD-10-CM | POA: Diagnosis not present

## 2019-12-13 DIAGNOSIS — M530 Cervicocranial syndrome: Secondary | ICD-10-CM | POA: Diagnosis not present

## 2019-12-13 DIAGNOSIS — M62830 Muscle spasm of back: Secondary | ICD-10-CM | POA: Diagnosis not present

## 2019-12-13 DIAGNOSIS — M9902 Segmental and somatic dysfunction of thoracic region: Secondary | ICD-10-CM | POA: Diagnosis not present

## 2019-12-13 DIAGNOSIS — M9903 Segmental and somatic dysfunction of lumbar region: Secondary | ICD-10-CM | POA: Diagnosis not present

## 2019-12-13 DIAGNOSIS — S335XXA Sprain of ligaments of lumbar spine, initial encounter: Secondary | ICD-10-CM | POA: Diagnosis not present

## 2019-12-29 ENCOUNTER — Ambulatory Visit: Payer: Medicare Other

## 2020-01-07 ENCOUNTER — Ambulatory Visit: Payer: Medicare PPO | Attending: Internal Medicine

## 2020-01-07 DIAGNOSIS — Z23 Encounter for immunization: Secondary | ICD-10-CM | POA: Insufficient documentation

## 2020-01-07 NOTE — Progress Notes (Signed)
   Covid-19 Vaccination Clinic  Name:  Virginia [name]    MRN: [id number] DOB: 09/16/1941  01/07/2020  Ms. Pair was observed post Covid-19 immunization for 15 minutes without incidence. She was provided with Vaccine Information Sheet and instruction to access the V-Safe system.   [name] was instructed to call 911 with any severe reactions post vaccine: Marland Kitchen Difficulty breathing  . Swelling of your face and throat  . A fast heartbeat  . A bad rash all over your body  . Dizziness and weakness    Immunizations Administered    Name Date Dose VIS Date Route   Pfizer COVID-19 Vaccine 01/07/2020 12:25 PM 0.3 mL 11/11/2019 Intramuscular   Manufacturer: [place]   Lot: [id number]   [place]: [id number]

## 2020-01-11 DIAGNOSIS — Z1231 Encounter for screening mammogram for malignant neoplasm of breast: Secondary | ICD-10-CM | POA: Diagnosis not present

## 2020-02-01 ENCOUNTER — Ambulatory Visit: Payer: Medicare PPO | Attending: Internal Medicine

## 2020-02-01 DIAGNOSIS — Z23 Encounter for immunization: Secondary | ICD-10-CM | POA: Insufficient documentation

## 2020-02-01 NOTE — Progress Notes (Signed)
   Covid-19 Vaccination Clinic  Name:  Virginia [name]    MRN: [id number] DOB: 1941-07-24  02/01/2020  Virginia [name] was observed post Covid-19 immunization for 15 minutes without incident. She was provided with Vaccine Information Sheet and instruction to access the V-Safe system.   Virginia [name] was instructed to call 911 with any severe reactions post vaccine: Marland Kitchen Difficulty breathing  . Swelling of face and throat  . A fast heartbeat  . A bad rash all over body  . Dizziness and weakness   Immunizations Administered    Name Date Dose VIS Date Route   Pfizer COVID-19 Vaccine 02/01/2020  1:06 PM 0.3 mL 11/11/2019 Intramuscular   Manufacturer: [place]   Lot: [id number]   [place]: [id number]

## 2020-02-01 NOTE — Progress Notes (Signed)
Subjective:    Patient ID: Virginia [name], female    DOB: Nov 11, 1941, 79 y.o.   MRN: [id number]  HPI The patient is here for follow up of their chronic medical problems, including hypertension, prediabetes, hyperlipidemia   She is taking all of her medications as prescribed.   She is active, but not exercising regularly.  She is fairly compliant with a low sugar diet.     She has had headaches on the top of her head intermittently. She takes Tylenol some.  It helps. It may be her neck.  She goes to the chiropractor who does neck and back work. With movement of the neck sometimes there is head pain.  She does not think the headaches are a big concern at this time.     She fell twice.  She was on a step stool and when she came down and she mis-stepped.  She fell back and she landed on her back and then her head.  The second fall she tripped over an extension cord in her daughter's yard and fell forward. She did not injure herself.       Medications and allergies reviewed with patient and updated if appropriate.  Patient Active Problem List   Diagnosis Date Noted  . Acute on chronic anemia 05/25/2019  . Tinnitus 05/24/2019  . Pain of toe of right foot 05/24/2019  . Left foot pain 05/13/2017  . Prediabetes 04/19/2016  . Constipation 04/16/2016  . Bilateral thumb pain 04/16/2016  . Dysphagia 10/17/2015  . Osteopenia 06/21/2014  . Back pain   . Sciatica of right side 05/12/2011  . 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 Osteoarthritis 01/01/2010  . FIBROIDS, UTERUS 12/13/2009  . Hyperlipidemia 12/13/2009  . Essential hypertension 12/13/2009  . ALLERGIC RHINITIS 12/13/2009  . COLONIC POLYPS, HX OF 12/13/2009    Current Outpatient Medications on File Prior to Visit  Medication Sig Dispense Refill  . Choline Fenofibrate (FENOFIBRIC ACID) 135 MG CPDR TAKE 1 CAPSULE BY MOUTH DAILY. 90 capsule 1  . loratadine (CLARITIN) 10 MG tablet Take 10 mg by mouth daily.    . metoprolol tartrate  (LOPRESSOR) 50 MG tablet TAKE 1 TABLET BY MOUTH TWICE A DAY 180 tablet 1  . Multiple Vitamins-Minerals (AIRBORNE GUMMIES PO) Take 2 each by mouth daily.    . Omega-3 Fatty Acids (FISH OIL) 1000 MG CAPS Take 1,000 mg by mouth daily. 2000 mg in the morning and 1000 mg at night    . pantoprazole (PROTONIX) 40 MG tablet Take 1 tablet (40 mg total) by mouth daily. 90 tablet 3   No current facility-administered medications on file prior to visit.    Past Medical History:  Diagnosis Date  . ALLERGIC RHINITIS   . Allergy   . Anemia   . Angiectasia 06/30/2019   small- in the small bowel  . Blood transfusion without reported diagnosis   . Cataract    beginning stage in lt. eye  . Chronic kidney disease   . Diverticulosis   . FIBROIDS, UTERUS    s/p fibroidectomy  . GERD   . HYPERLIPIDEMIA    intol of statins (myalgia, thumb pain)  . HYPERTENSION   . Intestinal ulcer 06/30/2019   2 large ulcerations in the mid to distal small bowel (seen on capsule endoscopy)  . OSTEOARTHRITIS   . Osteopenia 06/21/2014   DEXA@LB  06/14/14: -1.6  . 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 Tubular adenoma of colon     Past  Surgical History:  Procedure Laterality Date  . child birth  41 & 73  . COLONOSCOPY    . Fibroidectomy    . Low back surgery  01/1994   2 bulging disc  . POLYPECTOMY    . TONSILLECTOMY  1968  . UPPER GASTROINTESTINAL ENDOSCOPY      Social History   Socioeconomic History  . Marital status: Widowed    Spouse name: Not on file  . Number of children: 2  . Years of education: Not on file  . Highest education level: Not on file  Occupational History  . Occupation: retired  Tobacco Use  . Smoking status: Never Smoker  . Smokeless tobacco: Never Used  . 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 Alcohol use: No    Alcohol/week: 0.0 standard drinks  . Drug use: No  . Sexual activity: Not Currently    Other Topics Concern  . Not on file  Social History Narrative    2 children;   One in [place] and one is in [place]   Retired Pharmacist, hospital   Social Determinants of Radio broadcast assistant Strain:   . Difficulty of Paying Living Expenses: Not on file  Food Insecurity:   . Worried About Charity fundraiser in the Last Year: Not on file  . Ran Out of Food in the Last Year: Not on file  Transportation Needs:   . Lack of Transportation (Medical): Not on file  . Lack of Transportation (Non-Medical): Not on file  Physical Activity: Inactive  . Days of Exercise per Week: 0 days  . Minutes of Exercise per Session: 0 min  Stress: Stress Concern Present  . Feeling of Stress : To some extent  Social Connections:   . Frequency of Communication with Friends and Family: Not on file  . Frequency of Social Gatherings with Friends and Family: Not on file  . Attends Religious Services: Not on file  . Active Member of Clubs or Organizations: Not on file  . Attends Archivist Meetings: Not on file  . Marital Status: Not on file    Family History  Problem Relation Age of Onset  . Arthritis Mother   . Coronary artery disease Father   . Hyperlipidemia Brother   . Diabetes Maternal Aunt   . Hyperlipidemia Cousin   . Heart disease Cousin   . Diabetes Paternal Aunt   . Colon cancer Neg Hx   . Colon polyps Neg Hx   . Esophageal cancer Neg Hx   . Stomach cancer Neg Hx   . Rectal cancer Neg Hx     Review of Systems  Constitutional: Negative for chills and fever.  Eyes: Negative for visual disturbance.  Respiratory: Positive for cough (a little - allergy related) and shortness of breath (with moderate exertion). Negative for wheezing.   Cardiovascular: Positive for palpitations (anxiety related) and leg swelling (mild). Negative for chest pain.  Musculoskeletal: Positive for neck pain.  Neurological: Positive for light-headedness (at times) and headaches.       Objective:    Vitals:   02/02/20 1024  BP: 140/72  Pulse: 63  Resp: 16  Temp: 98.4 F (36.9 C)  SpO2: 97%   BP Readings from Last 3 Encounters:  02/02/20 140/72  08/15/19 136/70  07/29/19 (!) 144/82   Wt Readings from Last 3 Encounters:  02/02/20 185 lb 9.6 oz (84.2 kg)  08/15/19 186 lb (84.4 kg)  07/29/19 184 lb (83.5 kg)   Body mass index is 33.95 kg/m.   Physical Exam    Constitutional: Appears well-developed and well-nourished. No distress.  HENT:  Head: Normocephalic and atraumatic.  Neck: Neck supple. No tracheal deviation present. No thyromegaly present.  No cervical lymphadenopathy Cardiovascular: Normal rate, regular rhythm and normal heart sounds.   No murmur heard. No carotid bruit .  No edema Pulmonary/Chest: Effort normal and breath sounds normal. No respiratory distress. No has no wheezes. No rales.  Skin: Skin is warm and dry. Not diaphoretic.  Psychiatric: Normal mood and affect. Behavior is normal.      Assessment & Plan:    See Problem List for Assessment and Plan of chronic medical problems.    This visit occurred during the SARS-CoV-2 public health emergency.  Safety protocols were in place, including screening questions prior to the visit, additional usage of staff PPE, and extensive cleaning of exam room while observing appropriate contact time as indicated for disinfecting solutions.

## 2020-02-01 NOTE — Assessment & Plan Note (Addendum)
Chronic On fenofibrate acid Check lipid, cmp

## 2020-02-01 NOTE — Patient Instructions (Addendum)
  Blood work was ordered.     Medications reviewed and updated.  Changes include :   none  Your prescription(s) have been submitted to your pharmacy. Please take as directed and contact our office if you believe you are having problem(s) with the medication(s).   Please followup in 6 months   

## 2020-02-02 ENCOUNTER — Encounter: Payer: Self-pay | Admitting: Internal Medicine

## 2020-02-02 ENCOUNTER — Ambulatory Visit: Payer: Medicare PPO | Admitting: Internal Medicine

## 2020-02-02 ENCOUNTER — Other Ambulatory Visit: Payer: Self-pay

## 2020-02-02 VITALS — BP 140/72 | HR 63 | Temp 98.4°F | Resp 16 | Ht 62.0 in | Wt 185.6 lb

## 2020-02-02 DIAGNOSIS — R0600 Dyspnea, unspecified: Secondary | ICD-10-CM

## 2020-02-02 DIAGNOSIS — R7303 Prediabetes: Secondary | ICD-10-CM

## 2020-02-02 DIAGNOSIS — E7849 Other hyperlipidemia: Secondary | ICD-10-CM

## 2020-02-02 DIAGNOSIS — K2100 Gastro-esophageal reflux disease with esophagitis, without bleeding: Secondary | ICD-10-CM | POA: Diagnosis not present

## 2020-02-02 DIAGNOSIS — R519 Headache, unspecified: Secondary | ICD-10-CM | POA: Insufficient documentation

## 2020-02-02 DIAGNOSIS — I10 Essential (primary) hypertension: Secondary | ICD-10-CM

## 2020-02-02 DIAGNOSIS — R0609 Other forms of dyspnea: Secondary | ICD-10-CM

## 2020-02-02 LAB — CBC WITH DIFFERENTIAL/PLATELET
Basophils Absolute: 0 10*3/uL (ref 0.0–0.1)
Basophils Relative: 0.6 % (ref 0.0–3.0)
Eosinophils Absolute: 0.2 10*3/uL (ref 0.0–0.7)
Eosinophils Relative: 3.2 % (ref 0.0–5.0)
HCT: 42.6 % (ref 36.0–46.0)
Hemoglobin: 14.1 g/dL (ref 12.0–15.0)
Lymphocytes Relative: 33.5 % (ref 12.0–46.0)
Lymphs Abs: 2.2 10*3/uL (ref 0.7–4.0)
MCHC: 33 g/dL (ref 30.0–36.0)
MCV: 87.1 fl (ref 78.0–100.0)
Monocytes Absolute: 0.5 10*3/uL (ref 0.1–1.0)
Monocytes Relative: 8.3 % (ref 3.0–12.0)
Neutro Abs: 3.6 10*3/uL (ref 1.4–7.7)
Neutrophils Relative %: 54.4 % (ref 43.0–77.0)
Platelets: 198 10*3/uL (ref 150.0–400.0)
RBC: 4.89 Mil/uL (ref 3.87–5.11)
RDW: 14.7 % (ref 11.5–15.5)
WBC: 6.6 10*3/uL (ref 4.0–10.5)

## 2020-02-02 LAB — COMPREHENSIVE METABOLIC PANEL
ALT: 16 U/L (ref 0–35)
AST: 16 U/L (ref 0–37)
Albumin: 3.9 g/dL (ref 3.5–5.2)
Alkaline Phosphatase: 49 U/L (ref 39–117)
BUN: 21 mg/dL (ref 6–23)
CO2: 32 mEq/L (ref 19–32)
Calcium: 11.4 mg/dL — ABNORMAL HIGH (ref 8.4–10.5)
Chloride: 105 mEq/L (ref 96–112)
Creatinine, Ser: 0.89 mg/dL (ref 0.40–1.20)
GFR: 61.49 mL/min (ref >60.00)
Glucose, Bld: 104 mg/dL — ABNORMAL HIGH (ref 70–99)
Potassium: 4.5 mEq/L (ref 3.5–5.1)
Sodium: 140 mEq/L (ref 135–145)
Total Bilirubin: 0.5 mg/dL (ref 0.2–1.2)
Total Protein: 6.8 g/dL (ref 6.0–8.3)

## 2020-02-02 LAB — LIPID PANEL
Cholesterol: 185 mg/dL (ref 0–200)
HDL: 38.3 mg/dL — ABNORMAL LOW (ref >39.00)
NonHDL: 146.77
Total CHOL/HDL Ratio: 5
Triglycerides: 299 mg/dL — ABNORMAL HIGH (ref 0.0–149.0)
VLDL: 59.8 mg/dL — ABNORMAL HIGH (ref 0.0–40.0)

## 2020-02-02 LAB — HEMOGLOBIN A1C: Hgb A1c MFr Bld: 6.6 % — ABNORMAL HIGH (ref 4.6–6.5)

## 2020-02-02 LAB — LDL CHOLESTEROL, DIRECT: Direct LDL: 127 mg/dL

## 2020-02-02 MED ORDER — FENOFIBRIC ACID 135 MG PO CPDR: 1.0000 | DELAYED_RELEASE_CAPSULE | Freq: Every day | ORAL | 1 refills | Status: DC

## 2020-02-02 MED ORDER — METOPROLOL TARTRATE 50 MG PO TABS: 50.0000 mg | ORAL_TABLET | Freq: Two times a day (BID) | ORAL | 1 refills | Status: DC

## 2020-02-02 NOTE — Assessment & Plan Note (Signed)
DOE is chronic Reviewed possible causes again - had anemia in the past and has stopped the pantoprazole so will check cbc ? Cardiac in nature or pulmonary Encouraged regular exercise declined cardio referral

## 2020-02-02 NOTE — Assessment & Plan Note (Signed)
BP Readings from Last 3 Encounters:  02/02/20 140/72  08/15/19 136/70  07/29/19 (!) 144/82   Chronic BP well controlled Current regimen effective and well tolerated Continue current medications at current doses cmp

## 2020-02-02 NOTE — Assessment & Plan Note (Signed)
Frequent headaches on top of head -- likely related to chronic neck pain Following with chriopractor Advised trying heat, ice tylenol prn declined neuro evaluation

## 2020-02-02 NOTE — Assessment & Plan Note (Addendum)
Stopped pantoprazole Has GERD 2/week, occ dysphagia EGD over the summer - esophagitis, hiatal hernia Restart protonix and continue daily - discussed risk of not taking it

## 2020-02-02 NOTE — Assessment & Plan Note (Signed)
Chronic Check a1c Low sugar / carb diet Stressed regular exercise  

## 2020-02-13 ENCOUNTER — Telehealth: Payer: Self-pay | Admitting: Internal Medicine

## 2020-02-13 ENCOUNTER — Other Ambulatory Visit (INDEPENDENT_AMBULATORY_CARE_PROVIDER_SITE_OTHER): Payer: Medicare PPO

## 2020-02-13 DIAGNOSIS — D509 Iron deficiency anemia, unspecified: Secondary | ICD-10-CM | POA: Diagnosis not present

## 2020-02-13 LAB — CBC WITH DIFFERENTIAL/PLATELET
Basophils Absolute: 0.1 10*3/uL (ref 0.0–0.1)
Basophils Relative: 1 % (ref 0.0–3.0)
Eosinophils Absolute: 0.3 10*3/uL (ref 0.0–0.7)
Eosinophils Relative: 3.7 % (ref 0.0–5.0)
HCT: 42.3 % (ref 36.0–46.0)
Hemoglobin: 14.4 g/dL (ref 12.0–15.0)
Lymphocytes Relative: 36.7 % (ref 12.0–46.0)
Lymphs Abs: 2.9 10*3/uL (ref 0.7–4.0)
MCHC: 33.9 g/dL (ref 30.0–36.0)
MCV: 86.2 fl (ref 78.0–100.0)
Monocytes Absolute: 0.5 10*3/uL (ref 0.1–1.0)
Monocytes Relative: 5.9 % (ref 3.0–12.0)
Neutro Abs: 4.1 10*3/uL (ref 1.4–7.7)
Neutrophils Relative %: 52.7 % (ref 43.0–77.0)
Platelets: 210 10*3/uL (ref 150.0–400.0)
RBC: 4.91 Mil/uL (ref 3.87–5.11)
RDW: 14.6 % (ref 11.5–15.5)
WBC: 7.8 10*3/uL (ref 4.0–10.5)

## 2020-02-13 LAB — IBC + FERRITIN
Ferritin: 16 ng/mL (ref 10.0–291.0)
Iron: 78 ug/dL (ref 42–145)
Saturation Ratios: 16.2 % — ABNORMAL LOW (ref 20.0–50.0)
Transferrin: 344 mg/dL (ref 212.0–360.0)

## 2020-02-13 NOTE — Telephone Encounter (Signed)
The pt has been advised that IBC ferritin has not been done.  She will come in for labs as requested

## 2020-02-24 DIAGNOSIS — M1711 Unilateral primary osteoarthritis, right knee: Secondary | ICD-10-CM | POA: Diagnosis not present

## 2020-02-24 DIAGNOSIS — M25561 Pain in right knee: Secondary | ICD-10-CM | POA: Diagnosis not present

## 2020-03-05 DIAGNOSIS — M25561 Pain in right knee: Secondary | ICD-10-CM | POA: Diagnosis not present

## 2020-03-08 IMAGING — CR CHEST - 2 VIEW
2 series · 2 of 2 positions shown · non-contrast
Comparison: None.

CLINICAL DATA: Shortness of breath.  Recent toe injury.

EXAM:
CHEST - 2 VIEW

[w chest pa]
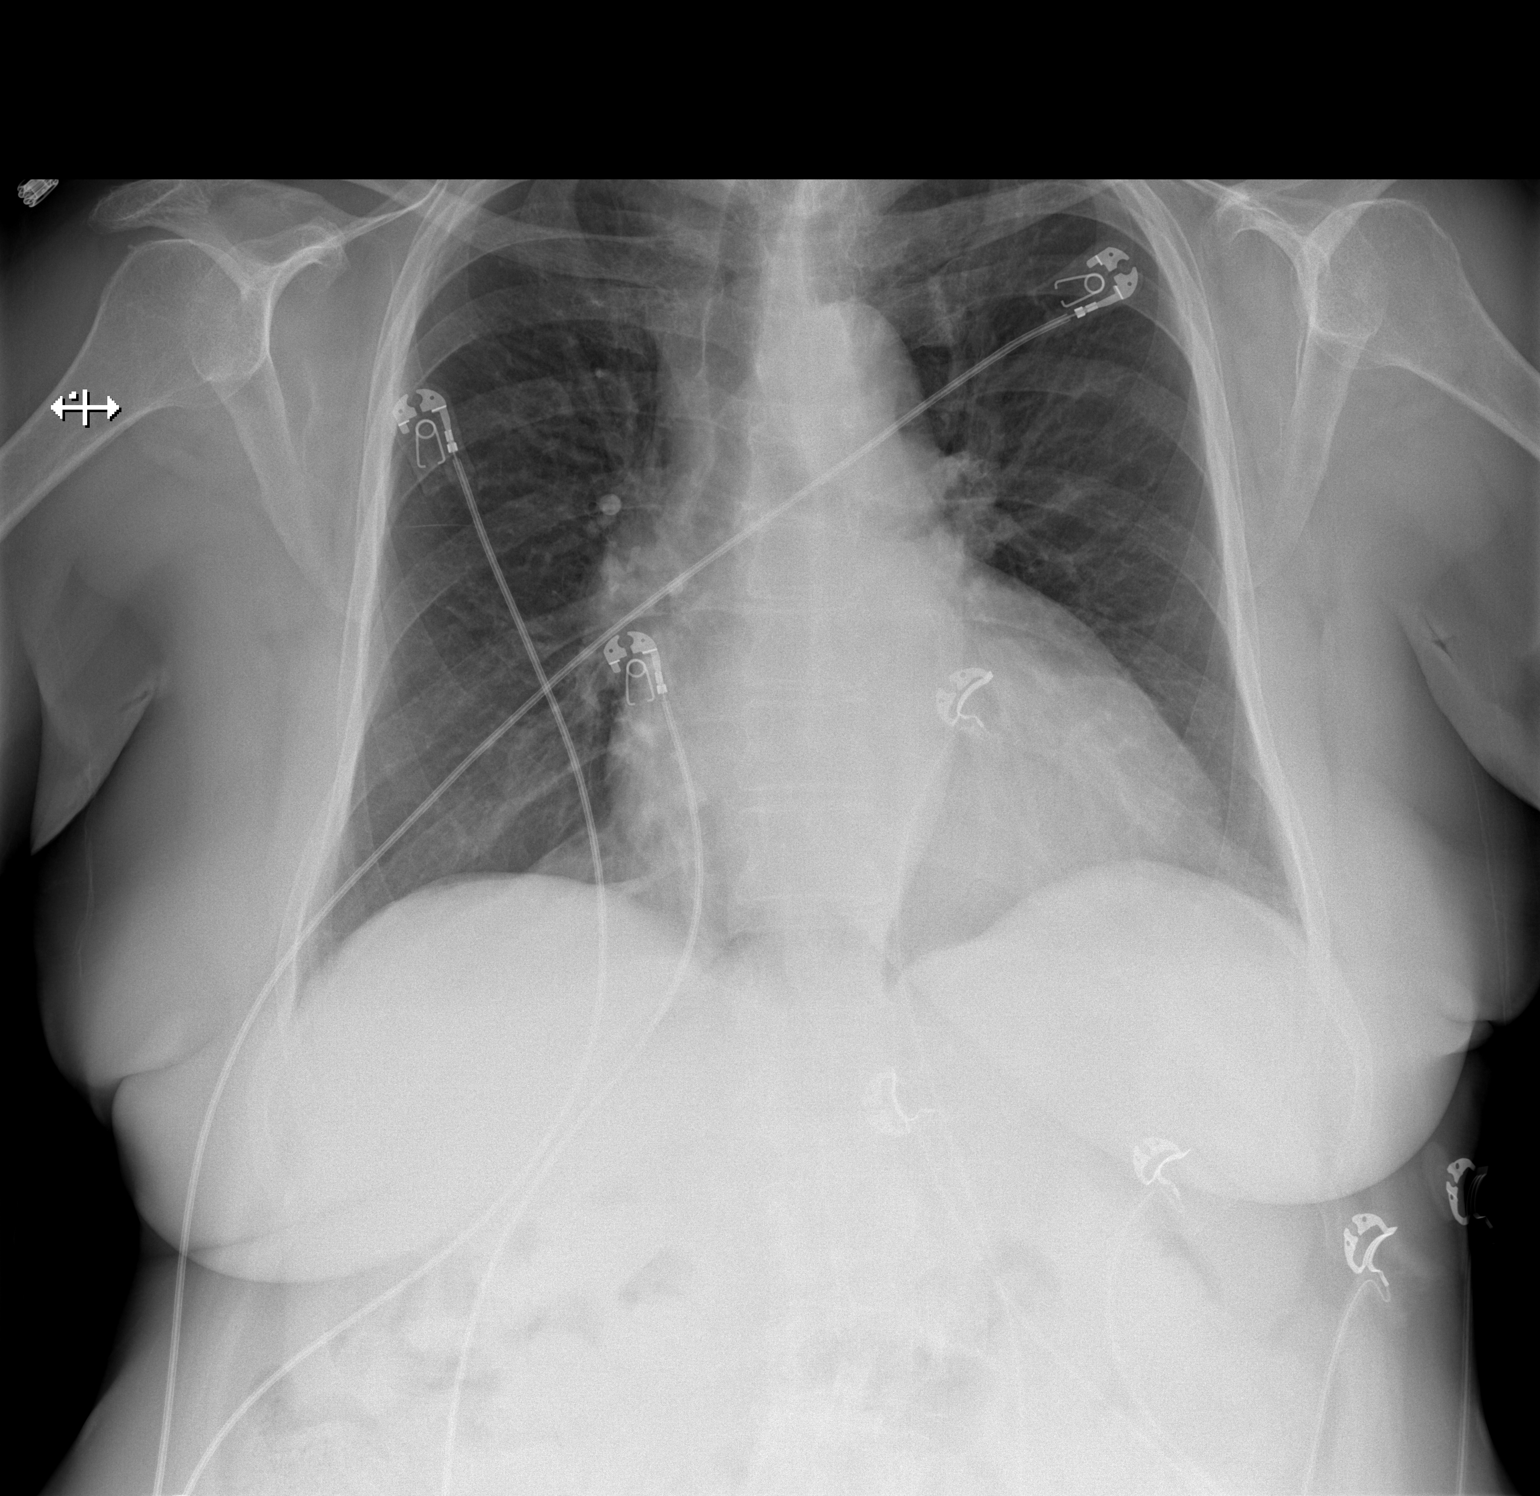

[w chest lat]
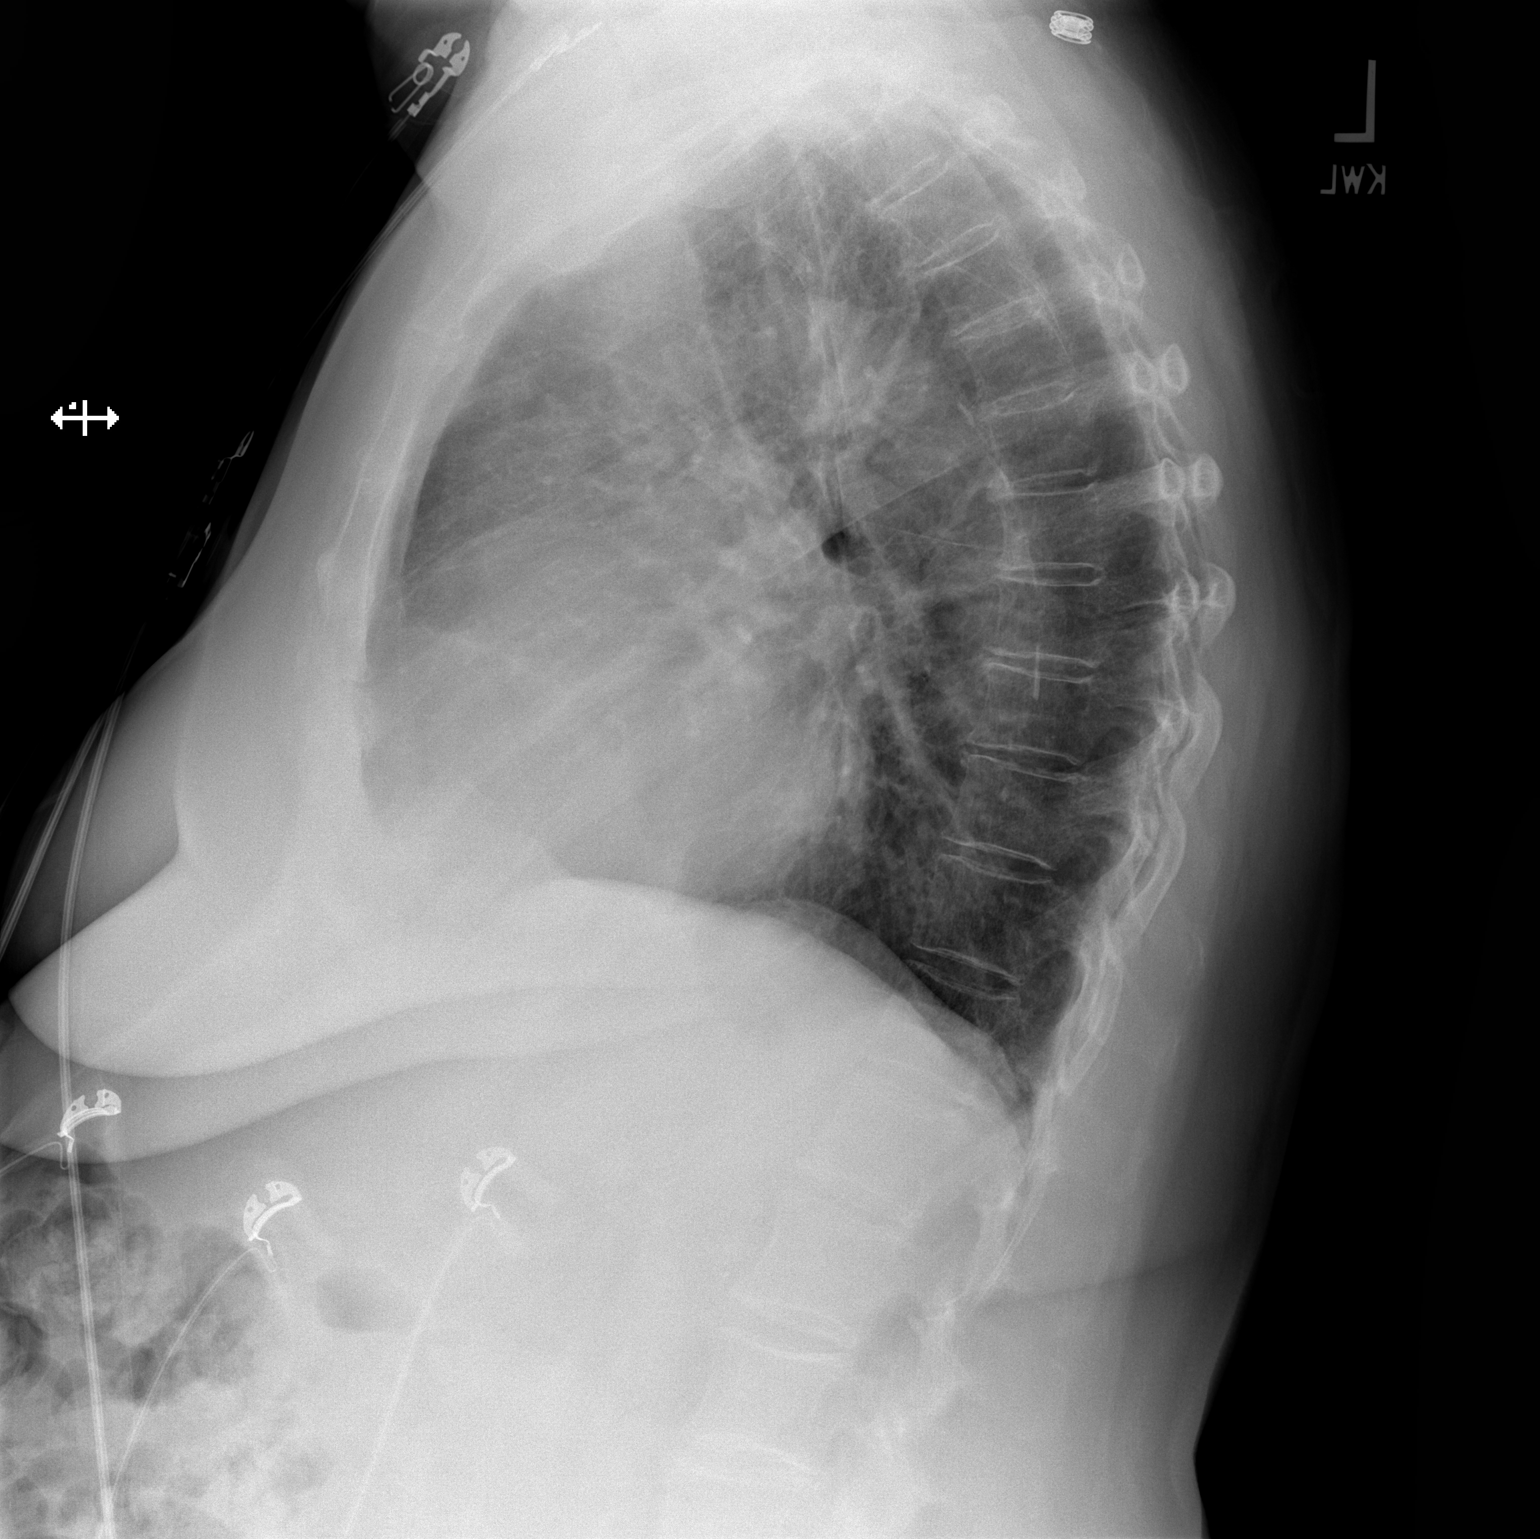

[2 of 2 positions shown; findings below may reference images not displayed]

FINDINGS: Heart size is mildly enlarged. Lungs are clear. No pulmonary edema.
There may be mild curvature in the thoracolumbar spine. No large
pleural effusions.
IMPRESSION: No active cardiopulmonary disease.

Heart size is mildly prominent.

## 2020-03-13 DIAGNOSIS — M25561 Pain in right knee: Secondary | ICD-10-CM | POA: Diagnosis not present

## 2020-03-20 DIAGNOSIS — M25561 Pain in right knee: Secondary | ICD-10-CM | POA: Diagnosis not present

## 2020-04-03 DIAGNOSIS — M25561 Pain in right knee: Secondary | ICD-10-CM | POA: Diagnosis not present

## 2020-06-14 DIAGNOSIS — D2272 Melanocytic nevi of left lower limb, including hip: Secondary | ICD-10-CM | POA: Diagnosis not present

## 2020-06-14 DIAGNOSIS — L814 Other melanin hyperpigmentation: Secondary | ICD-10-CM | POA: Diagnosis not present

## 2020-06-14 DIAGNOSIS — D2262 Melanocytic nevi of left upper limb, including shoulder: Secondary | ICD-10-CM | POA: Diagnosis not present

## 2020-06-14 DIAGNOSIS — L821 Other seborrheic keratosis: Secondary | ICD-10-CM | POA: Diagnosis not present

## 2020-06-14 DIAGNOSIS — D1801 Hemangioma of skin and subcutaneous tissue: Secondary | ICD-10-CM | POA: Diagnosis not present

## 2020-06-14 DIAGNOSIS — L578 Other skin changes due to chronic exposure to nonionizing radiation: Secondary | ICD-10-CM | POA: Diagnosis not present

## 2020-06-18 ENCOUNTER — Other Ambulatory Visit: Payer: Self-pay | Admitting: Gastroenterology

## 2020-06-18 DIAGNOSIS — K2970 Gastritis, unspecified, without bleeding: Secondary | ICD-10-CM

## 2020-07-23 DIAGNOSIS — H25013 Cortical age-related cataract, bilateral: Secondary | ICD-10-CM | POA: Diagnosis not present

## 2020-07-23 DIAGNOSIS — H35033 Hypertensive retinopathy, bilateral: Secondary | ICD-10-CM | POA: Diagnosis not present

## 2020-07-23 DIAGNOSIS — H2513 Age-related nuclear cataract, bilateral: Secondary | ICD-10-CM | POA: Diagnosis not present

## 2020-07-23 DIAGNOSIS — H04123 Dry eye syndrome of bilateral lacrimal glands: Secondary | ICD-10-CM | POA: Diagnosis not present

## 2020-07-23 LAB — HM DIABETES EYE EXAM

## 2020-07-25 ENCOUNTER — Encounter: Payer: Self-pay | Admitting: Internal Medicine

## 2020-07-25 DIAGNOSIS — H35039 Hypertensive retinopathy, unspecified eye: Secondary | ICD-10-CM | POA: Insufficient documentation

## 2020-08-01 ENCOUNTER — Ambulatory Visit (INDEPENDENT_AMBULATORY_CARE_PROVIDER_SITE_OTHER): Payer: Medicare PPO | Admitting: Internal Medicine

## 2020-08-01 ENCOUNTER — Other Ambulatory Visit: Payer: Self-pay

## 2020-08-01 ENCOUNTER — Encounter: Payer: Self-pay | Admitting: Internal Medicine

## 2020-08-01 VITALS — BP 128/76 | HR 63 | Temp 98.2°F | Ht 62.0 in | Wt 174.6 lb

## 2020-08-01 DIAGNOSIS — M419 Scoliosis, unspecified: Secondary | ICD-10-CM | POA: Insufficient documentation

## 2020-08-01 DIAGNOSIS — K2100 Gastro-esophageal reflux disease with esophagitis, without bleeding: Secondary | ICD-10-CM | POA: Diagnosis not present

## 2020-08-01 DIAGNOSIS — I10 Essential (primary) hypertension: Secondary | ICD-10-CM

## 2020-08-01 DIAGNOSIS — Z0000 Encounter for general adult medical examination without abnormal findings: Secondary | ICD-10-CM | POA: Diagnosis not present

## 2020-08-01 DIAGNOSIS — E7849 Other hyperlipidemia: Secondary | ICD-10-CM | POA: Diagnosis not present

## 2020-08-01 DIAGNOSIS — M8589 Other specified disorders of bone density and structure, multiple sites: Secondary | ICD-10-CM | POA: Diagnosis not present

## 2020-08-01 DIAGNOSIS — E119 Type 2 diabetes mellitus without complications: Secondary | ICD-10-CM

## 2020-08-01 NOTE — Assessment & Plan Note (Signed)
Chronic BP well controlled Current regimen effective and well tolerated Continue current medications at current doses cmp  

## 2020-08-01 NOTE — Assessment & Plan Note (Signed)
Chronic Intermittent back pain and pain right hip to right knee Improved with adustments See chiropractor q 3 week

## 2020-08-01 NOTE — Assessment & Plan Note (Signed)
Chronic GERD controlled Continue daily medication protonix 40 mg daily

## 2020-08-01 NOTE — Assessment & Plan Note (Addendum)
Chronic dexa due - ordered Stressed regular exercise Taking MVI Check vitamin d level

## 2020-08-01 NOTE — Progress Notes (Signed)
Subjective:    Patient ID: Virginia [name], female    DOB: September 09, 1941, 79 y.o.   MRN: [id number]  HPI She is here for a physical exam.   She denies changes in her history and has no concerns.   Medications and allergies reviewed with patient and updated if appropriate.  Patient Active Problem List   Diagnosis Date Noted  . Hypertensive retinopathy 07/25/2020  . Headache 02/02/2020  . Acute on chronic anemia 05/25/2019  . DOE (dyspnea on exertion) 05/24/2019  . Tinnitus 05/24/2019  . Pain of toe of right foot 05/24/2019  . Left foot pain 05/13/2017  . Diabetes mellitus without complication ([place]) 71/05/2693  . Constipation 04/16/2016  . Bilateral thumb pain 04/16/2016  . Dysphagia 10/17/2015  . Osteopenia 06/21/2014  . Back pain   . Sciatica of right side 05/12/2011  . 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 Osteoarthritis 01/01/2010  . FIBROIDS, UTERUS 12/13/2009  . Hyperlipidemia 12/13/2009  . Essential hypertension 12/13/2009  . ALLERGIC RHINITIS 12/13/2009  . GERD (gastroesophageal reflux disease) 12/13/2009  . COLONIC POLYPS, HX OF 12/13/2009    Current Outpatient Medications on File Prior to Visit  Medication Sig Dispense Refill  . Choline Fenofibrate (FENOFIBRIC ACID) 135 MG CPDR Take 1 capsule by mouth daily. 90 capsule 1  . loratadine (CLARITIN) 10 MG tablet Take 10 mg by mouth daily.    . metoprolol tartrate (LOPRESSOR) 50 MG tablet Take 1 tablet (50 mg total) by mouth 2 (two) times daily. 180 tablet 1  . Multiple Vitamins-Minerals (AIRBORNE GUMMIES PO) Take 2 each by mouth daily.    . Omega-3 Fatty Acids (FISH OIL) 1000 MG CAPS Take 1,000 mg by mouth daily. 2000 mg in the morning and 1000 mg at night    . pantoprazole (PROTONIX) 40 MG tablet Take 1 tablet (40 mg total) by mouth daily. 90 tablet 3   No current facility-administered medications on file prior to visit.    Past Medical History:  Diagnosis Date  . ALLERGIC RHINITIS   . Allergy   . Anemia   .  Angiectasia 06/30/2019   small- in the small bowel  . Blood transfusion without reported diagnosis   . Cataract    beginning stage in lt. eye  . Chronic kidney disease   . Diverticulosis   . FIBROIDS, UTERUS    s/p fibroidectomy  . GERD   . HYPERLIPIDEMIA    intol of statins (myalgia, thumb pain)  . HYPERTENSION   . Intestinal ulcer 06/30/2019   2 large ulcerations in the mid to distal small bowel (seen on capsule endoscopy)  . OSTEOARTHRITIS   . Osteopenia 06/21/2014   DEXA@LB  06/14/14: -1.6  . 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 Tubular adenoma of colon     Past Surgical History:  Procedure Laterality Date  . child birth  56 & 5  . COLONOSCOPY    . Fibroidectomy    . Low back surgery  01/1994   2 bulging disc  . POLYPECTOMY    . TONSILLECTOMY  1968  . UPPER GASTROINTESTINAL ENDOSCOPY      Social History   Socioeconomic History  . Marital status: Widowed    Spouse name: Not on file  . Number of children: 2  . Years of education: Not on file  . Highest education level: Not on file  Occupational History  . Occupation: retired  Tobacco Use  . Smoking status: Never Smoker  . Smokeless tobacco: Never Used  .  Tobacco comment: Married, lives with spouse. Retired Engineer, petroleum  . Vaping Use: Never used  Substance and Sexual Activity  . Alcohol use: No    Alcohol/week: 0.0 standard drinks  . Drug use: No  . Sexual activity: Not Currently  Other Topics Concern  . Not on file  Social History Narrative    2 children;   One in [place] and one is in [place]   Retired Pharmacist, hospital   Social Determinants of Radio broadcast assistant Strain:   . Difficulty of Paying Living Expenses: Not on file  Food Insecurity:   . Worried About Charity fundraiser in the Last Year: Not on file  . Ran Out of Food in the Last Year: Not on file  Transportation Needs:   . Lack of Transportation (Medical): Not on file  . Lack of Transportation (Non-Medical):  Not on file  Physical Activity:   . Days of Exercise per Week: Not on file  . Minutes of Exercise per Session: Not on file  Stress:   . Feeling of Stress : Not on file  Social Connections:   . Frequency of Communication with Friends and Family: Not on file  . Frequency of Social Gatherings with Friends and Family: Not on file  . Attends Religious Services: Not on file  . Active Member of Clubs or Organizations: Not on file  . Attends Archivist Meetings: Not on file  . Marital Status: Not on file    Family History  Problem Relation Age of Onset  . Arthritis Mother   . Coronary artery disease Father   . Hyperlipidemia Brother   . Diabetes Maternal Aunt   . Hyperlipidemia Cousin   . Heart disease Cousin   . Diabetes Paternal Aunt   . Colon cancer Neg Hx   . Colon polyps Neg Hx   . Esophageal cancer Neg Hx   . Stomach cancer Neg Hx   . Rectal cancer Neg Hx     Review of Systems  Constitutional: Negative for chills and fever.  Eyes: Negative for visual disturbance.  Respiratory: Negative for cough, shortness of breath and wheezing.   Cardiovascular: Positive for leg swelling (mild). Negative for chest pain and palpitations.  Gastrointestinal: Negative for abdominal pain, blood in stool (no melana), constipation, diarrhea and nausea.       Gerd controlled  Genitourinary: Negative for dysuria and hematuria.  Musculoskeletal: Positive for arthralgias (bl hands, R hip-knee with laying - ? scoliosis) and back pain (scoliosis).  Skin: Negative for rash.  Neurological: Negative for dizziness, light-headedness and headaches.  Psychiatric/Behavioral: Negative for dysphoric mood and sleep disturbance (sleeps less). The patient is not nervous/anxious.        Objective:   Vitals:   08/01/20 0959  BP: 128/76  Pulse: 63  Temp: 98.2 F (36.8 C)  SpO2: 96%   Filed Weights   08/01/20 0959  Weight: 174 lb 9.6 oz (79.2 kg)   Body mass index is 31.93 kg/m.  BP  Readings from Last 3 Encounters:  08/01/20 128/76  02/02/20 140/72  08/15/19 136/70    Wt Readings from Last 3 Encounters:  08/01/20 174 lb 9.6 oz (79.2 kg)  02/02/20 185 lb 9.6 oz (84.2 kg)  08/15/19 186 lb (84.4 kg)     Physical Exam Constitutional: She appears well-developed and well-nourished. No distress.  HENT:  Head: Normocephalic and atraumatic.  Right Ear: External ear normal. Normal ear canal and TM Left Ear:  External ear normal.  Normal ear canal and TM Mouth/Throat: Oropharynx is clear and moist.  Eyes: Conjunctivae and EOM are normal.  Neck: Neck supple. No tracheal deviation present. No thyromegaly present.  No carotid bruit  Cardiovascular: Normal rate, regular rhythm and normal heart sounds.   No murmur heard.  No edema. Pulmonary/Chest: Effort normal and breath sounds normal. No respiratory distress. She has no wheezes. She has no rales.  Breast: deferred   Abdominal: Soft. She exhibits no distension. There is no tenderness.  Lymphadenopathy: She has no cervical adenopathy.  Skin: Skin is warm and dry. She is not diaphoretic.  Psychiatric: She has a normal mood and affect. Her behavior is normal.        Assessment & Plan:   Physical exam: Screening blood work    ordered Immunizations  Advised flu, discussed tdap, shignrix Colonoscopy  Up to date  Mammogram  Up to date Gyn  N/a  Dexa   - due - ordered Eye exams  Up to date  Exercise  None, active Weight  Advised weight loss Substance abuse  none Sees derm  See Problem List for Assessment and Plan of chronic medical problems.   This visit occurred during the SARS-CoV-2 public health emergency.  Safety protocols were in place, including screening questions prior to the visit, additional usage of staff PPE, and extensive cleaning of exam room while observing appropriate contact time as indicated for disinfecting solutions.

## 2020-08-01 NOTE — Assessment & Plan Note (Signed)
Chronic Check lipid panel  Continue daily fenofibrate Regular exercise and healthy diet encouraged

## 2020-08-01 NOTE — Patient Instructions (Addendum)
Blood work was ordered.    All other Health Maintenance issues reviewed.   All recommended immunizations and age-appropriate screenings are up-to-date or discussed.  No immunization administered today.   Medications reviewed and updated.  Changes include :   none    Please followup in 6 months    Health Maintenance, Female Adopting a healthy lifestyle and getting preventive care are important in promoting health and wellness. Ask your health care provider about:  The right schedule for you to have regular tests and exams.  Things you can do on your own to prevent diseases and keep yourself healthy. What should I know about diet, weight, and exercise? Eat a healthy diet   Eat a diet that includes plenty of vegetables, fruits, low-fat dairy products, and lean protein.  Do not eat a lot of foods that are high in solid fats, added sugars, or sodium. Maintain a healthy weight Body mass index (BMI) is used to identify weight problems. It estimates body fat based on height and weight. Your health care provider can help determine your BMI and help you achieve or maintain a healthy weight. Get regular exercise Get regular exercise. This is one of the most important things you can do for your health. Most adults should:  Exercise for at least 150 minutes each week. The exercise should increase your heart rate and make you sweat (moderate-intensity exercise).  Do strengthening exercises at least twice a week. This is in addition to the moderate-intensity exercise.  Spend less time sitting. Even light physical activity can be beneficial. Watch cholesterol and blood lipids Have your blood tested for lipids and cholesterol at 77 years of age, then have this test every 5 years. Have your cholesterol levels checked more often if:  Your lipid or cholesterol levels are high.  You are older than 77 years of age.  You are at high risk for heart disease. What should I know about cancer  screening? Depending on your health history and family history, you may need to have cancer screening at various ages. This may include screening for:  Breast cancer.  Cervical cancer.  Colorectal cancer.  Skin cancer.  Lung cancer. What should I know about heart disease, diabetes, and high blood pressure? Blood pressure and heart disease  High blood pressure causes heart disease and increases the risk of stroke. This is more likely to develop in people who have high blood pressure readings, are of African descent, or are overweight.  Have your blood pressure checked: ? Every 3-5 years if you are 18-39 years of age. ? Every year if you are 40 years old or older. Diabetes Have regular diabetes screenings. This checks your fasting blood sugar level. Have the screening done:  Once every three years after age 40 if you are at a normal weight and have a low risk for diabetes.  More often and at a younger age if you are overweight or have a high risk for diabetes. What should I know about preventing infection? Hepatitis B If you have a higher risk for hepatitis B, you should be screened for this virus. Talk with your health care provider to find out if you are at risk for hepatitis B infection. Hepatitis C Testing is recommended for:  Everyone born from 1945 through 1965.  Anyone with known risk factors for hepatitis C. Sexually transmitted infections (STIs)  Get screened for STIs, including gonorrhea and chlamydia, if: ? You are sexually active and are younger than 77 years of   age. ? You are older than 77 years of age and your health care provider tells you that you are at risk for this type of infection. ? Your sexual activity has changed since you were last screened, and you are at increased risk for chlamydia or gonorrhea. Ask your health care provider if you are at risk.  Ask your health care provider about whether you are at high risk for HIV. Your health care provider may  recommend a prescription medicine to help prevent HIV infection. If you choose to take medicine to prevent HIV, you should first get tested for HIV. You should then be tested every 3 months for as long as you are taking the medicine. Pregnancy  If you are about to stop having your period (premenopausal) and you may become pregnant, seek counseling before you get pregnant.  Take 400 to 800 micrograms (mcg) of folic acid every day if you become pregnant.  Ask for birth control (contraception) if you want to prevent pregnancy. Osteoporosis and menopause Osteoporosis is a disease in which the bones lose minerals and strength with aging. This can result in bone fractures. If you are 65 years old or older, or if you are at risk for osteoporosis and fractures, ask your health care provider if you should:  Be screened for bone loss.  Take a calcium or vitamin D supplement to lower your risk of fractures.  Be given hormone replacement therapy (HRT) to treat symptoms of menopause. Follow these instructions at home: Lifestyle  Do not use any products that contain nicotine or tobacco, such as cigarettes, e-cigarettes, and chewing tobacco. If you need help quitting, ask your health care provider.  Do not use street drugs.  Do not share needles.  Ask your health care provider for help if you need support or information about quitting drugs. Alcohol use  Do not drink alcohol if: ? Your health care provider tells you not to drink. ? You are pregnant, may be pregnant, or are planning to become pregnant.  If you drink alcohol: ? Limit how much you use to 0-1 drink a day. ? Limit intake if you are breastfeeding.  Be aware of how much alcohol is in your drink. In the [place] one drink equals one 12 oz bottle of beer (355 mL), one 5 oz glass of wine (148 mL), or one 1 oz glass of hard liquor (44 mL). General instructions  Schedule regular health, dental, and eye exams.  Stay current with your  vaccines.  Tell your health care provider if: ? You often feel depressed. ? You have ever been abused or do not feel safe at home. Summary  Adopting a healthy lifestyle and getting preventive care are important in promoting health and wellness.  Follow your health care provider's instructions about healthy diet, exercising, and getting tested or screened for diseases.  Follow your health care provider's instructions on monitoring your cholesterol and blood pressure. This information is not intended to replace advice given to you by your health care provider. Make sure you discuss any questions you have with your health care provider. Document Revised: 11/10/2018 Document Reviewed: 11/10/2018 Elsevier Patient Education  2020 Elsevier Inc.  

## 2020-08-01 NOTE — Assessment & Plan Note (Signed)
Chronic Check a1c, urine micro Diet controlled Continue low sugar/carb diet  Stressed regular exercise

## 2020-08-02 LAB — COMPREHENSIVE METABOLIC PANEL
AG Ratio: 1.5 (calc) (ref 1.0–2.5)
ALT: 15 U/L (ref 6–29)
AST: 15 U/L (ref 10–35)
Albumin: 4.1 g/dL (ref 3.6–5.1)
Alkaline phosphatase (APISO): 51 U/L (ref 37–153)
BUN: 22 mg/dL (ref 7–25)
CO2: 27 mmol/L (ref 20–32)
Calcium: 11.1 mg/dL — ABNORMAL HIGH (ref 8.6–10.4)
Chloride: 105 mmol/L (ref 98–110)
Creat: 0.88 mg/dL (ref 0.60–0.93)
Globulin: 2.7 g/dL (calc) (ref 1.9–3.7)
Glucose, Bld: 96 mg/dL (ref 65–99)
Potassium: 4.5 mmol/L (ref 3.5–5.3)
Sodium: 139 mmol/L (ref 135–146)
Total Bilirubin: 0.6 mg/dL (ref 0.2–1.2)
Total Protein: 6.8 g/dL (ref 6.1–8.1)

## 2020-08-02 LAB — CBC WITH DIFFERENTIAL/PLATELET
Absolute Monocytes: 555 cells/uL (ref 200–950)
Basophils Absolute: 43 cells/uL (ref 0–200)
Basophils Relative: 0.7 %
Eosinophils Absolute: 207 cells/uL (ref 15–500)
Eosinophils Relative: 3.4 %
HCT: 42.3 % (ref 35.0–45.0)
Hemoglobin: 13.9 g/dL (ref 11.7–15.5)
Lymphs Abs: 2214 cells/uL (ref 850–3900)
MCH: 28.8 pg (ref 27.0–33.0)
MCHC: 32.9 g/dL (ref 32.0–36.0)
MCV: 87.8 fL (ref 80.0–100.0)
MPV: 11 fL (ref 7.5–12.5)
Monocytes Relative: 9.1 %
Neutro Abs: 3081 cells/uL (ref 1500–7800)
Neutrophils Relative %: 50.5 %
Platelets: 205 10*3/uL (ref 140–400)
RBC: 4.82 10*6/uL (ref 3.80–5.10)
RDW: 13.2 % (ref 11.0–15.0)
Total Lymphocyte: 36.3 %
WBC: 6.1 10*3/uL (ref 3.8–10.8)

## 2020-08-02 LAB — MICROALBUMIN / CREATININE URINE RATIO
Creatinine, Urine: 49 mg/dL (ref 20–275)
Microalb Creat Ratio: 12 mcg/mg creat (ref <30)
Microalb, Ur: 0.6 mg/dL

## 2020-08-02 LAB — LIPID PANEL
Cholesterol: 206 mg/dL — ABNORMAL HIGH (ref <200)
HDL: 45 mg/dL — ABNORMAL LOW (ref >=50)
LDL Cholesterol (Calc): 123 mg/dL (calc) — ABNORMAL HIGH
Non-HDL Cholesterol (Calc): 161 mg/dL (calc) — ABNORMAL HIGH (ref <130)
Total CHOL/HDL Ratio: 4.6 (calc) (ref <5.0)
Triglycerides: 236 mg/dL — ABNORMAL HIGH (ref <150)

## 2020-08-02 LAB — TSH: TSH: 2.76 mIU/L (ref 0.40–4.50)

## 2020-08-02 LAB — VITAMIN D 25 HYDROXY (VIT D DEFICIENCY, FRACTURES): Vit D, 25-Hydroxy: 33 ng/mL (ref 30–100)

## 2020-08-02 LAB — HEMOGLOBIN A1C
Hgb A1c MFr Bld: 6.1 % of total Hgb — ABNORMAL HIGH (ref <5.7)
Mean Plasma Glucose: 128 (calc)
eAG (mmol/L): 7.1 (calc)

## 2020-08-09 ENCOUNTER — Telehealth: Payer: Self-pay | Admitting: *Deleted

## 2020-08-09 DIAGNOSIS — D509 Iron deficiency anemia, unspecified: Secondary | ICD-10-CM

## 2020-08-09 NOTE — Telephone Encounter (Signed)
-----   Message from Larina Bras, [place] sent at 08/02/2020  2:13 PM EDT -----  ----- Message ----- From: Larina Bras, CMA Sent: 08/02/2020 To: Larina Bras, CMA  Needs cbc, ibc, ferritin around 08/15/20. See 02/13/20 lab result note

## 2020-08-09 NOTE — Telephone Encounter (Signed)
Called and reminded patient that she needs repeat labs around 08/15/20. Patient verbalizes understanding.

## 2020-08-15 ENCOUNTER — Other Ambulatory Visit (INDEPENDENT_AMBULATORY_CARE_PROVIDER_SITE_OTHER): Payer: Medicare PPO

## 2020-08-15 DIAGNOSIS — D509 Iron deficiency anemia, unspecified: Secondary | ICD-10-CM | POA: Diagnosis not present

## 2020-08-15 LAB — CBC WITH DIFFERENTIAL/PLATELET
Basophils Absolute: 0.1 10*3/uL (ref 0.0–0.1)
Basophils Relative: 0.8 % (ref 0.0–3.0)
Eosinophils Absolute: 0.2 10*3/uL (ref 0.0–0.7)
Eosinophils Relative: 3.3 % (ref 0.0–5.0)
HCT: 41.3 % (ref 36.0–46.0)
Hemoglobin: 13.8 g/dL (ref 12.0–15.0)
Lymphocytes Relative: 38.3 % (ref 12.0–46.0)
Lymphs Abs: 2.7 10*3/uL (ref 0.7–4.0)
MCHC: 33.5 g/dL (ref 30.0–36.0)
MCV: 87.9 fl (ref 78.0–100.0)
Monocytes Absolute: 0.5 10*3/uL (ref 0.1–1.0)
Monocytes Relative: 7.2 % (ref 3.0–12.0)
Neutro Abs: 3.6 10*3/uL (ref 1.4–7.7)
Neutrophils Relative %: 50.4 % (ref 43.0–77.0)
Platelets: 198 10*3/uL (ref 150.0–400.0)
RBC: 4.7 Mil/uL (ref 3.87–5.11)
RDW: 14.1 % (ref 11.5–15.5)
WBC: 7.1 10*3/uL (ref 4.0–10.5)

## 2020-08-15 LAB — IBC + FERRITIN
Ferritin: 19.7 ng/mL (ref 10.0–291.0)
Iron: 113 ug/dL (ref 42–145)
Saturation Ratios: 22.3 % (ref 20.0–50.0)
Transferrin: 362 mg/dL — ABNORMAL HIGH (ref 212.0–360.0)

## 2020-08-17 ENCOUNTER — Encounter: Payer: Self-pay | Admitting: Physician Assistant

## 2020-08-17 ENCOUNTER — Ambulatory Visit (INDEPENDENT_AMBULATORY_CARE_PROVIDER_SITE_OTHER): Payer: Medicare PPO | Admitting: Physician Assistant

## 2020-08-17 VITALS — BP 152/70 | HR 64 | Ht 62.0 in | Wt 177.0 lb

## 2020-08-17 DIAGNOSIS — K219 Gastro-esophageal reflux disease without esophagitis: Secondary | ICD-10-CM | POA: Diagnosis not present

## 2020-08-17 DIAGNOSIS — D509 Iron deficiency anemia, unspecified: Secondary | ICD-10-CM | POA: Diagnosis not present

## 2020-08-17 DIAGNOSIS — Z86010 Personal history of colonic polyps: Secondary | ICD-10-CM | POA: Diagnosis not present

## 2020-08-17 MED ORDER — PANTOPRAZOLE SODIUM 40 MG PO TBEC
40.0000 mg | DELAYED_RELEASE_TABLET | Freq: Every day | ORAL | 3 refills | Status: DC
Start: 2020-08-17 — End: 2021-08-22

## 2020-08-17 NOTE — Patient Instructions (Addendum)
If you are age 77 or older, your body mass index should be between 23-30. Your Body mass index is 32.37 kg/m. If this is out of the aforementioned range listed, please consider follow up with your Primary Care Provider.  If you are age 29 or younger, your body mass index should be between 19-25. Your Body mass index is 32.37 kg/m. If this is out of the aformentioned range listed, please consider follow up with your Primary Care Provider.   Your provider has requested that you go to the basement level for lab work in January 2022. Press "B" on the elevator. The lab is located at the first door on the left as you exit the elevator. Your orders have already been placed and you will receive a reminder when you are due.  Continue Pantoprazole 40 mg 1 tablet every morning before breakfast. Refills have been sent to your pharmacy.  Remember that you will be due for you next Colonoscopy in September of 2022.  Follow up with Dr. Hilarie Fredrickson or [facility] PA-C as needed.

## 2020-08-17 NOTE — Progress Notes (Signed)
Subjective:    Patient ID: Virginia [name], female    DOB: 16-Jun-1941, 79 y.o.   MRN: [id number]  HPI Virginia [name] is a pleasant 79 year old white female, established with Dr. Hilarie Fredrickson, who comes in today for follow-up of iron deficiency anemia. Patient was last seen in September 2020.  She had undergone extensive work-up prior to that time for iron deficiency anemia with hemoglobin of 5.4 in May 2020.  She had EGD at that time showing gastric polyps and mild duodenitis, and then capsule endoscopy July 2020 with finding of 1 mid small bowel angio ectasia and 2 more distal small bowel ulcers.  Colonoscopy had been done in June 2019 with removal of 3 polyps which were tubular adenomas and also noted diverticulosis.  She is indicated for 3-year interval follow-up. Patient had transfusions and iron infusion May 2020.  Hemoglobin in August 2020 stable at 12.3.  Follow-up labs 08/01/2020 showing WBC 6.1/hemoglobin 13.9/platelets 205/c-Met unremarkable Serum iron 113 TIBC 362 ferritin 19.7.  Patient says she has been feeling well and has no current GI symptoms.  She is on Protonix 40 mg once daily with good control of GERD symptoms.  No complaints of dysphagia.  She has no complaints of abdominal discomfort, bowel movements have been normal and has not noted any melena or hematochezia. She is not taking any aspirin or NSAIDs.  When she had initially presented with the anemia she had been on a baby aspirin daily which she discontinued. She has not taking any oral iron supplementation as she did not tolerate well.    Review of Systems Pertinent positive and negative review of systems were noted in the above HPI section.  All other review of systems was otherwise negative.  Outpatient Encounter Medications as of 08/17/2020  Medication Sig  . Cholecalciferol (D3 HIGH POTENCY) 50 MCG (2000 UT) CAPS Take by mouth daily.  . Choline Fenofibrate (FENOFIBRIC ACID) 135 MG CPDR Take 1 capsule by mouth daily.  .  Loratadine (KS ALLERCLEAR PO) Take 1 tablet by mouth daily.  . metoprolol tartrate (LOPRESSOR) 50 MG tablet Take 1 tablet (50 mg total) by mouth 2 (two) times daily.  . Multiple Vitamins-Minerals (AIRBORNE GUMMIES PO) Take 2 each by mouth daily.  . Omega-3 Fatty Acids (FISH OIL) 1000 MG CAPS Take 1,000 mg by mouth daily. 2000 mg in the morning and 1000 mg at night  . pantoprazole (PROTONIX) 40 MG tablet Take 1 tablet (40 mg total) by mouth daily.  . [DISCONTINUED] pantoprazole (PROTONIX) 40 MG tablet Take 1 tablet (40 mg total) by mouth daily.  . [DISCONTINUED] loratadine (CLARITIN) 10 MG tablet Take 10 mg by mouth daily.   No facility-administered encounter medications on file as of 08/17/2020.   Allergies  Allergen Reactions  . Penicillins Hives    Did it involve swelling of the face/tongue/throat, SOB, or low BP? No Did it involve sudden or severe rash/hives, skin peeling, or any reaction on the inside of your mouth or nose? No Did you need to seek medical attention at a hospital or doctor's office? No When did it last happen?many years ago If all above answers are "NO", may proceed with cephalosporin use.   . Statins Other (See Comments)    pain in base of thumbs   Patient Active Problem List   Diagnosis Date Noted  . Scoliosis w/ chronic back pain, chiropractor 08/01/2020  . Hypertensive retinopathy 07/25/2020  . Headache 02/02/2020  . Tinnitus 05/24/2019  . Pain of toe of right foot 05/24/2019  .  Left foot pain 05/13/2017  . Diabetes mellitus without complication ([place]) 58/85/0277  . Constipation 04/16/2016  . Bilateral thumb pain 04/16/2016  . Dysphagia 10/17/2015  . Osteopenia 06/21/2014  . Sciatica of right side 05/12/2011  . 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 Osteoarthritis 01/01/2010  . FIBROIDS, UTERUS 12/13/2009  . Hyperlipidemia 12/13/2009  . Essential hypertension 12/13/2009  . ALLERGIC RHINITIS 12/13/2009  . GERD (gastroesophageal reflux disease) 12/13/2009   . COLONIC POLYPS, HX OF 12/13/2009   Social History   Socioeconomic History  . Marital status: Widowed    Spouse name: Not on file  . Number of children: 2  . Years of education: Not on file  . Highest education level: Not on file  Occupational History  . Occupation: retired  Tobacco Use  . Smoking status: Never Smoker  . Smokeless tobacco: Never Used  . Tobacco comment: Married, lives with spouse. Retired Engineer, petroleum  . Vaping Use: Never used  Substance and Sexual Activity  . Alcohol use: No    Alcohol/week: 0.0 standard drinks  . Drug use: No  . Sexual activity: Not Currently  Other Topics Concern  . Not on file  Social History Narrative    2 children;   One in [place] and one is in [place]   Retired Pharmacist, hospital   Social Determinants of Radio broadcast assistant Strain:   . Difficulty of Paying Living Expenses: Not on file  Food Insecurity:   . Worried About Charity fundraiser in the Last Year: Not on file  . Ran Out of Food in the Last Year: Not on file  Transportation Needs:   . Lack of Transportation (Medical): Not on file  . Lack of Transportation (Non-Medical): Not on file  Physical Activity:   . Days of Exercise per Week: Not on file  . Minutes of Exercise per Session: Not on file  Stress:   . Feeling of Stress : Not on file  Social Connections:   . Frequency of Communication with Friends and Family: Not on file  . Frequency of Social Gatherings with Friends and Family: Not on file  . Attends Religious Services: Not on file  . Active Member of Clubs or Organizations: Not on file  . Attends Archivist Meetings: Not on file  . Marital Status: Not on file  Intimate Partner Violence:   . Fear of Current or Ex-Partner: Not on file  . Emotionally Abused: Not on file  . Physically Abused: Not on file  . Sexually Abused: Not on file    [name]'s family history includes Arthritis in her mother; Coronary artery disease in her  father; Diabetes in her maternal aunt and paternal aunt; Heart disease in her cousin; Hyperlipidemia in her brother and cousin.      Objective:    Vitals:   08/17/20 1101  BP: (!) 152/70  Pulse: 64    Physical Exam Well-developed well-nourished  Elderly WF in no acute distress.  Height, AJOINO,676 BMI 32.3  HEENT; nontraumatic normocephalic, EOMI, PER R LA, sclera anicteric.  Neuro/Psych; alert and oriented x4, grossly nonfocal mood and affect appropriate       Assessment & Plan:   #83 79 year old white female with history of profound anemia and iron deficiency May 2020.  Work-up at that time with EGD and capsule endoscopy most pertinent for 1 mid small bowel AVM and a couple of more distal small bowel nonspecific ulcers.  Patient's hemoglobin has been very stable  over the past year, and most recent iron studies within normal limits.  Suspect she had had chronic or subacute GI blood loss causing the anemia previously and this may have been secondary to either AVMs or the small bowel ulcers.  No current aspirin or NSAID use. 2.  Chronic GERD stable on Protonix  #3 history of adenomatous colon polyps will be due for follow-up colonoscopy June 2022  #4 hypertension 5.  Adult onset diabetes mellitus 6.  Osteoarthritis  Plan; Continue avoidance of aspirin and NSAIDs We will plan repeat CBC and iron studies January 2022.  Patient advised to call in the interim for any problems, development of weakness shortness of breath dark stools etc. We will plan for follow-up colonoscopy September 2022 with Dr. Hilarie Fredrickson Continue Protonix 40 mg p.o. every morning AC breakfast.   Jasai Sorg Genia Harold PA-C 08/17/2020   Cc: [name], MD

## 2020-08-20 NOTE — Progress Notes (Signed)
Addendum: Reviewed and agree with assessment and management plan. Genni Buske M, MD  

## 2020-08-31 DIAGNOSIS — M1990 Unspecified osteoarthritis, unspecified site: Secondary | ICD-10-CM | POA: Diagnosis not present

## 2020-08-31 DIAGNOSIS — K219 Gastro-esophageal reflux disease without esophagitis: Secondary | ICD-10-CM | POA: Diagnosis not present

## 2020-08-31 DIAGNOSIS — Z880 Allergy status to penicillin: Secondary | ICD-10-CM | POA: Diagnosis not present

## 2020-08-31 DIAGNOSIS — Z6831 Body mass index (BMI) 31.0-31.9, adult: Secondary | ICD-10-CM | POA: Diagnosis not present

## 2020-08-31 DIAGNOSIS — I10 Essential (primary) hypertension: Secondary | ICD-10-CM | POA: Diagnosis not present

## 2020-08-31 DIAGNOSIS — E669 Obesity, unspecified: Secondary | ICD-10-CM | POA: Diagnosis not present

## 2020-08-31 DIAGNOSIS — Z833 Family history of diabetes mellitus: Secondary | ICD-10-CM | POA: Diagnosis not present

## 2020-08-31 DIAGNOSIS — E785 Hyperlipidemia, unspecified: Secondary | ICD-10-CM | POA: Diagnosis not present

## 2020-08-31 DIAGNOSIS — Z8249 Family history of ischemic heart disease and other diseases of the circulatory system: Secondary | ICD-10-CM | POA: Diagnosis not present

## 2020-09-26 ENCOUNTER — Ambulatory Visit (INDEPENDENT_AMBULATORY_CARE_PROVIDER_SITE_OTHER)
Admission: RE | Admit: 2020-09-26 | Discharge: 2020-09-26 | Disposition: A | Discharge status: Home / Self Care | Payer: Medicare PPO | Source: Ambulatory Visit | Attending: Internal Medicine | Admitting: Internal Medicine

## 2020-09-26 ENCOUNTER — Other Ambulatory Visit: Payer: Self-pay

## 2020-09-26 DIAGNOSIS — M8589 Other specified disorders of bone density and structure, multiple sites: Secondary | ICD-10-CM | POA: Diagnosis not present

## 2020-09-27 ENCOUNTER — Encounter: Payer: Self-pay | Admitting: Internal Medicine

## 2020-10-16 ENCOUNTER — Other Ambulatory Visit: Payer: Self-pay | Admitting: Internal Medicine

## 2020-11-23 DIAGNOSIS — U071 COVID-19: Secondary | ICD-10-CM | POA: Diagnosis not present

## 2020-12-05 ENCOUNTER — Other Ambulatory Visit (INDEPENDENT_AMBULATORY_CARE_PROVIDER_SITE_OTHER): Payer: Medicare PPO

## 2020-12-05 DIAGNOSIS — K219 Gastro-esophageal reflux disease without esophagitis: Secondary | ICD-10-CM | POA: Diagnosis not present

## 2020-12-05 DIAGNOSIS — D509 Iron deficiency anemia, unspecified: Secondary | ICD-10-CM | POA: Diagnosis not present

## 2020-12-05 LAB — CBC WITH DIFFERENTIAL/PLATELET
Basophils Absolute: 0.1 10*3/uL (ref 0.0–0.1)
Basophils Relative: 1.3 % (ref 0.0–3.0)
Eosinophils Absolute: 0.3 10*3/uL (ref 0.0–0.7)
Eosinophils Relative: 3.3 % (ref 0.0–5.0)
HCT: 42.7 % (ref 36.0–46.0)
Hemoglobin: 14.4 g/dL (ref 12.0–15.0)
Lymphocytes Relative: 34.1 % (ref 12.0–46.0)
Lymphs Abs: 2.7 10*3/uL (ref 0.7–4.0)
MCHC: 33.8 g/dL (ref 30.0–36.0)
MCV: 86.5 fl (ref 78.0–100.0)
Monocytes Absolute: 0.6 10*3/uL (ref 0.1–1.0)
Monocytes Relative: 7.5 % (ref 3.0–12.0)
Neutro Abs: 4.2 10*3/uL (ref 1.4–7.7)
Neutrophils Relative %: 53.8 % (ref 43.0–77.0)
Platelets: 228 10*3/uL (ref 150.0–400.0)
RBC: 4.93 Mil/uL (ref 3.87–5.11)
RDW: 14.3 % (ref 11.5–15.5)
WBC: 7.9 10*3/uL (ref 4.0–10.5)

## 2020-12-06 LAB — IRON,TIBC AND FERRITIN PANEL
%SAT: 23 % (calc) (ref 16–45)
Ferritin: 26 ng/mL (ref 16–288)
Iron: 91 ug/dL (ref 45–160)
TIBC: 391 mcg/dL (calc) (ref 250–450)

## 2021-01-29 ENCOUNTER — Ambulatory Visit: Payer: Medicare PPO | Admitting: Internal Medicine

## 2021-02-12 NOTE — Progress Notes (Signed)
Subjective:    Patient ID: Virginia [name], female    DOB: 06-10-1941, 80 y.o.   MRN: [id number]  HPI The patient is here for follow up of their chronic medical problems, including htn, DM, hyperlipidemia   She is active.      She is in the process of buying a house. There has been some anxiety with that.   She has had heart racing that will last a couple of minutes.  She does feels anxious at the time.  No associated with cp or SOB.  It does not feel like an irregular rhythm.     Diarrhea - having some diarrhea and lower abdominal pain after a BM.  This started about two weeks.  She has diarrhea - about 3/day they occur right after each other.  No blood in the stool.  No change in meds or diet.    Medications and allergies reviewed with patient and updated if appropriate.  Patient Active Problem List   Diagnosis Date Noted  . Scoliosis w/ chronic back pain, chiropractor 08/01/2020  . Hypertensive retinopathy 07/25/2020  . Headache 02/02/2020  . Tinnitus 05/24/2019  . Pain of toe of right foot 05/24/2019  . Left foot pain 05/13/2017  . Diabetes mellitus without complication ([place]) 25/04/3975  . Constipation 04/16/2016  . Bilateral thumb pain 04/16/2016  . Dysphagia 10/17/2015  . Osteopenia 06/21/2014  . Sciatica of right side 05/12/2011  . 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 Osteoarthritis 01/01/2010  . FIBROIDS, UTERUS 12/13/2009  . Hyperlipidemia 12/13/2009  . Essential hypertension 12/13/2009  . ALLERGIC RHINITIS 12/13/2009  . GERD (gastroesophageal reflux disease) 12/13/2009  . COLONIC POLYPS, HX OF 12/13/2009    Current Outpatient Medications on File Prior to Visit  Medication Sig Dispense Refill  . Cholecalciferol (D3 HIGH POTENCY) 50 MCG (2000 UT) CAPS Take by mouth daily.    . Choline Fenofibrate (FENOFIBRIC ACID) 135 MG CPDR TAKE 1 CAPSULE BY MOUTH EVERY DAY 90 capsule 1  . Loratadine (KS ALLERCLEAR PO) Take 1 tablet by mouth daily.    . metoprolol tartrate  (LOPRESSOR) 50 MG tablet TAKE 1 TABLET BY MOUTH TWICE A DAY 180 tablet 1  . Multiple Vitamins-Minerals (AIRBORNE GUMMIES PO) Take 2 each by mouth daily.    . Omega-3 Fatty Acids (FISH OIL) 1000 MG CAPS Take 1,000 mg by mouth daily. 2000 mg in the morning and 1000 mg at night    . pantoprazole (PROTONIX) 40 MG tablet Take 1 tablet (40 mg total) by mouth daily. 90 tablet 3   No current facility-administered medications on file prior to visit.    Past Medical History:  Diagnosis Date  . ALLERGIC RHINITIS   . Allergy   . Anemia   . Angiectasia 06/30/2019   small- in the small bowel  . Blood transfusion without reported diagnosis   . Cataract    beginning stage in lt. eye  . Chronic kidney disease   . Diverticulosis   . FIBROIDS, UTERUS    s/p fibroidectomy  . GERD   . HYPERLIPIDEMIA    intol of statins (myalgia, thumb pain)  . HYPERTENSION   . Intestinal ulcer 06/30/2019   2 large ulcerations in the mid to distal small bowel (seen on capsule endoscopy)  . OSTEOARTHRITIS   . Osteopenia 06/21/2014   DEXA@LB  06/14/14: -1.6  . 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 Tubular adenoma of colon     Past Surgical History:  Procedure Laterality Date  .  child birth  80 & 22  . COLONOSCOPY    . Fibroidectomy    . Low back surgery  01/1994   2 bulging disc  . POLYPECTOMY    . TONSILLECTOMY  1968  . UPPER GASTROINTESTINAL ENDOSCOPY      Social History   Socioeconomic History  . Marital status: Widowed    Spouse name: Not on file  . Number of children: 2  . Years of education: Not on file  . Highest education level: Not on file  Occupational History  . Occupation: retired  Tobacco Use  . Smoking status: Never Smoker  . Smokeless tobacco: Never Used  . Tobacco comment: Married, lives with spouse. Retired Engineer, petroleum  . Vaping Use: Never used  Substance and Sexual Activity  . Alcohol use: No    Alcohol/week: 0.0 standard drinks  . Drug use: No   . Sexual activity: Not Currently  Other Topics Concern  . Not on file  Social History Narrative    2 children;   One in [place] and one is in [place]   Retired Pharmacist, hospital   Social Determinants of Radio broadcast assistant Strain: Not on [employer] Insecurity: Not on file  Transportation Needs: Not on file  Physical Activity: Not on file  Stress: Not on file  Social Connections: Not on file    Family History  Problem Relation Age of Onset  . Arthritis Mother   . Coronary artery disease Father   . Hyperlipidemia Brother   . Diabetes Maternal Aunt   . Hyperlipidemia Cousin   . Heart disease Cousin   . Diabetes Paternal Aunt   . Colon cancer Neg Hx   . Colon polyps Neg Hx   . Esophageal cancer Neg Hx   . Stomach cancer Neg Hx   . Rectal cancer Neg Hx     Review of Systems  Constitutional: Negative for fever.  HENT: Negative for trouble swallowing.   Respiratory: Negative for cough, shortness of breath and wheezing.   Cardiovascular: Positive for palpitations (heart racing). Negative for chest pain and leg swelling.  Gastrointestinal: Positive for abdominal pain (lower abdomen after diarrhea) and diarrhea. Negative for blood in stool.       Gerd controlled  Musculoskeletal: Positive for back pain.  Neurological: Negative for light-headedness and headaches.       Objective:   Vitals:   02/13/21 0932  BP: 138/64  Pulse: 73  Temp: 98.2 F (36.8 C)  SpO2: 95%   BP Readings from Last 3 Encounters:  02/13/21 138/64  08/17/20 (!) 152/70  08/01/20 128/76   Wt Readings from Last 3 Encounters:  02/13/21 183 lb (83 kg)  08/17/20 177 lb (80.3 kg)  08/01/20 174 lb 9.6 oz (79.2 kg)   Body mass index is 33.47 kg/m.  Depression screen [facility] 2/9 02/13/2021 07/29/2019 01/26/2019 05/19/2018 12/16/2017  Decreased Interest 0 0 0 0 0  Down, Depressed, Hopeless 0 1 0 1 0  PHQ - 2 Score 0 1 0 1 0  Altered sleeping 0 - - 0 -  Tired, decreased energy 1 - - 1 -  Change in appetite 1  - - 0 -  Feeling bad or failure about yourself  0 - - 0 -  Trouble concentrating 0 - - 0 -  Moving slowly or fidgety/restless 0 - - 0 -  Suicidal thoughts 0 - - 0 -  PHQ-9 Score 2 - - 2 -  Difficult  doing work/chores Not difficult at all - - Not difficult at all -     GAD 7 : Generalized Anxiety Score 02/13/2021  Nervous, Anxious, on Edge 1  Control/stop worrying 0  Worry too much - different things 1  Trouble relaxing 0  Restless 0  Easily annoyed or irritable 0  Afraid - awful might happen 1  Total GAD 7 Score 3  Anxiety Difficulty Not difficult at all       Physical Exam    Constitutional: Appears well-developed and well-nourished. No distress.  HENT:  Head: Normocephalic and atraumatic.  Neck: Neck supple. No tracheal deviation present. No thyromegaly present.  No cervical lymphadenopathy Cardiovascular: Normal rate, regular rhythm and normal heart sounds.   No murmur heard. No carotid bruit .  No edema Pulmonary/Chest: Effort normal and breath sounds normal. No respiratory distress. No has no wheezes. No rales.  Skin: Skin is warm and dry. Not diaphoretic.  Psychiatric: Normal mood and affect. Behavior is normal.      Assessment & Plan:    Screened for depression using the PHQ 9 scale.  No evidence of depression.   Anxiety screening using the GAD7 score reveals mild anxiety, which is agrees with. She feels this is circumstantial and does not need anything for it.      See Problem List for Assessment and Plan of chronic medical problems.    This visit occurred during the SARS-CoV-2 public health emergency.  Safety protocols were in place, including screening questions prior to the visit, additional usage of staff PPE, and extensive cleaning of exam room while observing appropriate contact time as indicated for disinfecting solutions.

## 2021-02-12 NOTE — Patient Instructions (Addendum)
°  Blood work was ordered.      Medications changes include :   none     Please followup in 6 months

## 2021-02-13 ENCOUNTER — Encounter: Payer: Self-pay | Admitting: Internal Medicine

## 2021-02-13 ENCOUNTER — Other Ambulatory Visit: Payer: Self-pay

## 2021-02-13 ENCOUNTER — Ambulatory Visit: Payer: Medicare PPO | Admitting: Internal Medicine

## 2021-02-13 VITALS — BP 138/64 | HR 73 | Temp 98.2°F | Ht 62.0 in | Wt 183.0 lb

## 2021-02-13 DIAGNOSIS — K2100 Gastro-esophageal reflux disease with esophagitis, without bleeding: Secondary | ICD-10-CM | POA: Diagnosis not present

## 2021-02-13 DIAGNOSIS — E119 Type 2 diabetes mellitus without complications: Secondary | ICD-10-CM

## 2021-02-13 DIAGNOSIS — E7849 Other hyperlipidemia: Secondary | ICD-10-CM | POA: Diagnosis not present

## 2021-02-13 DIAGNOSIS — R000 Tachycardia, unspecified: Secondary | ICD-10-CM | POA: Insufficient documentation

## 2021-02-13 DIAGNOSIS — R197 Diarrhea, unspecified: Secondary | ICD-10-CM

## 2021-02-13 DIAGNOSIS — Z1159 Encounter for screening for other viral diseases: Secondary | ICD-10-CM

## 2021-02-13 DIAGNOSIS — R195 Other fecal abnormalities: Secondary | ICD-10-CM | POA: Insufficient documentation

## 2021-02-13 DIAGNOSIS — I10 Essential (primary) hypertension: Secondary | ICD-10-CM

## 2021-02-13 LAB — LIPID PANEL
Cholesterol: 193 mg/dL (ref 0–200)
HDL: 40.4 mg/dL (ref >39.00)
NonHDL: 153.03
Total CHOL/HDL Ratio: 5
Triglycerides: 241 mg/dL — ABNORMAL HIGH (ref 0.0–149.0)
VLDL: 48.2 mg/dL — ABNORMAL HIGH (ref 0.0–40.0)

## 2021-02-13 LAB — COMPREHENSIVE METABOLIC PANEL
ALT: 17 U/L (ref 0–35)
AST: 15 U/L (ref 0–37)
Albumin: 3.9 g/dL (ref 3.5–5.2)
Alkaline Phosphatase: 47 U/L (ref 39–117)
BUN: 19 mg/dL (ref 6–23)
CO2: 32 mEq/L (ref 19–32)
Calcium: 10.8 mg/dL — ABNORMAL HIGH (ref 8.4–10.5)
Chloride: 103 mEq/L (ref 96–112)
Creatinine, Ser: 0.8 mg/dL (ref 0.40–1.20)
GFR: 70.73 mL/min (ref >60.00)
Glucose, Bld: 129 mg/dL — ABNORMAL HIGH (ref 70–99)
Potassium: 4.1 mEq/L (ref 3.5–5.1)
Sodium: 140 mEq/L (ref 135–145)
Total Bilirubin: 0.7 mg/dL (ref 0.2–1.2)
Total Protein: 6.8 g/dL (ref 6.0–8.3)

## 2021-02-13 LAB — HEMOGLOBIN A1C: Hgb A1c MFr Bld: 6.7 % — ABNORMAL HIGH (ref 4.6–6.5)

## 2021-02-13 LAB — LDL CHOLESTEROL, DIRECT: Direct LDL: 137 mg/dL

## 2021-02-13 NOTE — Assessment & Plan Note (Signed)
Chronic Diet controlled a1c today

## 2021-02-13 NOTE — Assessment & Plan Note (Signed)
Chronic BP well controlled Continue metoprolol 50 mg BID cmp

## 2021-02-13 NOTE — Assessment & Plan Note (Signed)
New problem She has had a few episodes of heart racing-it does not feel like an abnormal rhythm, just a fast rhythm.  Typically lasts a couple of minutes She does feel anxious when this happens and has had increased anxiety recently No associated chest pain or shortness of breath Sinus rhythm on exam She deferred referral to cardiology at this time-she feels this is likely related to stress and would like to just monitor for now.  Advised to call if she continues to have the symptoms or if it worsens Discussed stress management

## 2021-02-13 NOTE — Assessment & Plan Note (Signed)
Chronic GERD controlled, no dysphagia Continue protonix 40 mg daily

## 2021-02-13 NOTE — Assessment & Plan Note (Signed)
Chronic Check lipid panel  Continue fenofibrate 135 mg daily Regular exercise and healthy diet encouraged

## 2021-02-13 NOTE — Assessment & Plan Note (Signed)
Acute Started two weeks ago - has diarrhea x 3 - episodes in a row right after each other and then fine the rest of the day Has some assoc lower abdominal cramping before and after BM No blood, weight loss Has increased anxiety  - likely IBS

## 2021-02-14 LAB — HEPATITIS C ANTIBODY
Hepatitis C Ab: NONREACTIVE
SIGNAL TO CUT-OFF: 0.01 (ref <1.00)

## 2021-02-18 DIAGNOSIS — Z1231 Encounter for screening mammogram for malignant neoplasm of breast: Secondary | ICD-10-CM | POA: Diagnosis not present

## 2021-04-21 ENCOUNTER — Other Ambulatory Visit: Payer: Self-pay | Admitting: Internal Medicine

## 2021-07-29 DIAGNOSIS — H2513 Age-related nuclear cataract, bilateral: Secondary | ICD-10-CM | POA: Diagnosis not present

## 2021-07-29 DIAGNOSIS — H35033 Hypertensive retinopathy, bilateral: Secondary | ICD-10-CM | POA: Diagnosis not present

## 2021-07-29 DIAGNOSIS — H524 Presbyopia: Secondary | ICD-10-CM | POA: Diagnosis not present

## 2021-07-29 DIAGNOSIS — H25013 Cortical age-related cataract, bilateral: Secondary | ICD-10-CM | POA: Diagnosis not present

## 2021-07-29 DIAGNOSIS — H04123 Dry eye syndrome of bilateral lacrimal glands: Secondary | ICD-10-CM | POA: Diagnosis not present

## 2021-07-31 ENCOUNTER — Telehealth: Payer: Self-pay | Admitting: Internal Medicine

## 2021-07-31 NOTE — Telephone Encounter (Signed)
Left message for patient to call me back at [phone] to schedule Medicare Annual Wellness Visit   Last AWV  07/29/19  Please schedule at anytime with LB Craig if patient calls the office back.    40 Minutes appointment   Any questions, please call me at [phone]

## 2021-08-18 NOTE — Progress Notes (Signed)
Subjective:    Patient ID: Virginia [name], female    DOB: 03/01/1941, 80 y.o.   MRN: [id number]   This visit occurred during the SARS-CoV-2 public health emergency.  Safety protocols were in place, including screening questions prior to the visit, additional usage of staff PPE, and extensive cleaning of exam room while observing appropriate contact time as indicated for disinfecting solutions.    HPI She is here for a physical exam.   Increased back pain -- chronic lower back pain - has been seeing a chiropractor.  Pain has increased.  No pain, N/T, weakness in legs.  Tylenol as needed helped.      Medications and allergies reviewed with patient and updated if appropriate.  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     Loratadine ([place] ALLERCLEAR PO) Take 1 tablet by mouth daily.     Multiple Vitamins-Minerals (AIRBORNE GUMMIES PO) Take 2 each by mouth daily.     Omega-3 Fatty Acids (FISH OIL) 1000 MG CAPS Take 1,000 mg by mouth daily. 2000 mg in the morning and 1000 mg at  night     pantoprazole (PROTONIX) 40 MG tablet Take 1 tablet (40 mg total) by mouth daily. 90 tablet 3   No current facility-administered medications on file prior to visit.    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   s/p dilation   Tubular adenoma of colon     Past Surgical History:  Procedure Laterality Date   child birth  107 & 10   COLONOSCOPY     Fibroidectomy     Low back surgery  01/1994   2 bulging disc   POLYPECTOMY     TONSILLECTOMY  1968   UPPER GASTROINTESTINAL ENDOSCOPY      Social History   Socioeconomic History   Marital status: Widowed    Spouse name: Not on file  Number of children: 2   Years of education: Not on file   Highest education level: Not on file  Occupational History   Occupation: retired  Tobacco Use   Smoking status: Never   Smokeless tobacco: Never   Tobacco comments:    Married, 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  Substance and Sexual Activity   Alcohol use: No    Alcohol/week: 0.0 standard drinks   Drug use: No   Sexual activity: Not Currently  Other Topics Concern   Not on file  Social History Narrative    2 children;   One in [place] and one is in [place]   Retired Pharmacist, hospital   Social Determinants of Radio broadcast assistant Strain: Not on file  Food Insecurity: Not on file  Transportation Needs: Not on file   Physical Activity: Not on file  Stress: Not on file  Social Connections: Not on file    Family History  Problem Relation Age of Onset   Arthritis Mother    Coronary artery disease Father    Hyperlipidemia Brother    Diabetes Maternal Aunt    Hyperlipidemia Cousin    Heart disease Cousin    Diabetes Paternal Aunt    Colon cancer Neg Hx    Colon polyps Neg Hx    Esophageal cancer Neg Hx    Stomach cancer Neg Hx    Rectal cancer Neg Hx     Review of Systems  Constitutional:  Negative for chills and fever.  Eyes:  Negative for visual disturbance.  Respiratory:  Negative for cough, shortness of breath and wheezing.   Cardiovascular:  Negative for chest pain, palpitations and leg swelling.  Gastrointestinal:  Negative for abdominal pain (some abd cramping after a BM sometimes), blood in stool, constipation, diarrhea and nausea.       No gerd  Genitourinary:  Positive for frequency (mild). Negative for difficulty urinating, dysuria and hematuria.       Bladder discomfort before urinating often  Musculoskeletal:  Positive for arthralgias (thumbs, knees) and back pain.  Skin:  Negative for color change and rash.  Neurological:  Negative for light-headedness and headaches.  Psychiatric/Behavioral:  Negative for dysphoric mood. The patient is not nervous/anxious.       Objective:   Vitals:   08/19/21 1015  BP: 140/70  Pulse: 64  Temp: 98.1 F (36.7 C)  SpO2: 97%   Filed Weights   08/19/21 1015  Weight: 183 lb (83 kg)   Body mass index is 33.47 kg/m.  BP Readings from Last 3 Encounters:  08/19/21 140/70  02/13/21 138/64  08/17/20 (!) 152/70    Wt Readings from Last 3 Encounters:  08/19/21 183 lb (83 kg)  02/13/21 183 lb (83 kg)  08/17/20 177 lb (80.3 kg)     Physical Exam Constitutional: She appears well-developed and well-nourished. No distress.  HENT:  Head: Normocephalic and atraumatic.  Right Ear: External ear normal. Normal ear canal and TM Left Ear:  External ear normal.  Normal ear canal and TM Mouth/Throat: Oropharynx is clear and moist.  Eyes: Conjunctivae and EOM are normal.  Neck: Neck supple. No tracheal deviation present. No thyromegaly present.  No carotid bruit  Cardiovascular: Normal rate, regular rhythm and normal heart sounds.   No murmur heard.  No edema. Pulmonary/Chest: Effort normal and breath sounds normal. No respiratory distress. She has no wheezes. She has no rales.  Breast: deferred  Abdominal: Soft. She exhibits no distension. There is no tenderness.  Lymphadenopathy: She has no cervical adenopathy.  Skin: Skin is warm and dry. She is not diaphoretic.  Psychiatric: She has a normal mood and affect. Her behavior is normal.     Lab Results  Component Value Date   WBC 7.9 12/05/2020   HGB 14.4 12/05/2020   HCT 42.7 12/05/2020   PLT 228.0 12/05/2020   GLUCOSE 129 (H) 02/13/2021   CHOL 193 02/13/2021   TRIG 241.0 (H) 02/13/2021   HDL 40.40 02/13/2021   LDLDIRECT 137.0 02/13/2021   LDLCALC 123 (H) 08/01/2020   ALT 17 02/13/2021   AST 15 02/13/2021   NA 140 02/13/2021   K 4.1 02/13/2021   CL 103 02/13/2021   CREATININE 0.80 02/13/2021   BUN 19 02/13/2021   CO2 32 02/13/2021   TSH 2.76 08/01/2020   INR 1.0 05/25/2019   HGBA1C 6.7 (H) 02/13/2021   MICROALBUR 0.6 08/01/2020    The 10-year ASCVD risk score (Arnett DK, et al., 2019) is: 52.2%   Values used to calculate the score:     Age: 44 years     Sex: Female     Is Non-Hispanic African American: No     Diabetic: Yes     Tobacco smoker: No     Systolic Blood Pressure: [id number] mmHg     Is BP treated: Yes     HDL Cholesterol: 40.4 mg/dL     Total Cholesterol: 193 mg/dL      Assessment & Plan:   Physical exam: Screening blood work  ordered Exercise  active, no formal exercise Weight  encouraged weight loss Substance abuse  none Dr weaver - eye - up to date  Reviewed recommended immunizations.  Flu vaccine today   Health Maintenance   Topic Date Due   FOOT EXAM  Never done   COVID-19 Vaccine (3 - Booster for Pfizer series) 07/03/2020   COLONOSCOPY (Pts 45-22yr Insurance coverage will need to be confirmed)  05/24/2021   OPHTHALMOLOGY EXAM  07/23/2021   URINE MICROALBUMIN  08/01/2021   HEMOGLOBIN A1C  08/16/2021   TETANUS/TDAP  08/19/2022 (Originally 10/01/2016)   DEXA SCAN  09/26/2022   INFLUENZA VACCINE  Completed   Hepatitis C Screening  Completed   Zoster Vaccines- Shingrix  Completed   HPV VACCINES  Aged Out          See Problem List for Assessment and Plan of chronic medical problems.

## 2021-08-18 NOTE — Patient Instructions (Addendum)
Blood work was ordered.  Have this done in 6 weeks.    Flu immunization administered today.     Medications changes include :   stop fenofibrate and start pitavastatin.  Your prescription(s) have been submitted to your pharmacy. Please take as directed and contact our office if you believe you are having problem(s) with the medication(s).   Please followup in 6 months    Health Maintenance, Female Adopting a healthy lifestyle and getting preventive care are important in promoting health and wellness. Ask your health care provider about: The right schedule for you to have regular tests and exams. Things you can do on your own to prevent diseases and keep yourself healthy. What should I know about diet, weight, and exercise? Eat a healthy diet  Eat a diet that includes plenty of vegetables, fruits, low-fat dairy products, and lean protein. Do not eat a lot of foods that are high in solid fats, added sugars, or sodium. Maintain a healthy weight Body mass index (BMI) is used to identify weight problems. It estimates body fat based on height and weight. Your health care provider can help determine your BMI and help you achieve or maintain a healthy weight. Get regular exercise Get regular exercise. This is one of the most important things you can do for your health. Most adults should: Exercise for at least 150 minutes each week. The exercise should increase your heart rate and make you sweat (moderate-intensity exercise). Do strengthening exercises at least twice a week. This is in addition to the moderate-intensity exercise. Spend less time sitting. Even light physical activity can be beneficial. Watch cholesterol and blood lipids Have your blood tested for lipids and cholesterol at 78 years of age, then have this test every 5 years. Have your cholesterol levels checked more often if: Your lipid or cholesterol levels are high. You are older than 78 years of age. You are at high  risk for heart disease. What should I know about cancer screening? Depending on your health history and family history, you may need to have cancer screening at various ages. This may include screening for: Breast cancer. Cervical cancer. Colorectal cancer. Skin cancer. Lung cancer. What should I know about heart disease, diabetes, and high blood pressure? Blood pressure and heart disease High blood pressure causes heart disease and increases the risk of stroke. This is more likely to develop in people who have high blood pressure readings, are of African descent, or are overweight. Have your blood pressure checked: Every 3-5 years if you are 48-34 years of age. Every year if you are 55 years old or older. Diabetes Have regular diabetes screenings. This checks your fasting blood sugar level. Have the screening done: Once every three years after age 67 if you are at a normal weight and have a low risk for diabetes. More often and at a younger age if you are overweight or have a high risk for diabetes. What should I know about preventing infection? Hepatitis B If you have a higher risk for hepatitis B, you should be screened for this virus. Talk with your health care provider to find out if you are at risk for hepatitis B infection. Hepatitis C Testing is recommended for: Everyone born from [birth year] through 1965. Anyone with known risk factors for hepatitis C. Sexually transmitted infections (STIs) Get screened for STIs, including gonorrhea and chlamydia, if: You are sexually active and are younger than 78 years of age. You are older than 78 years of age  and your health care provider tells you that you are at risk for this type of infection. Your sexual activity has changed since you were last screened, and you are at increased risk for chlamydia or gonorrhea. Ask your health care provider if you are at risk. Ask your health care provider about whether you are at high risk for HIV. Your  health care provider may recommend a prescription medicine to help prevent HIV infection. If you choose to take medicine to prevent HIV, you should first get tested for HIV. You should then be tested every 3 months for as long as you are taking the medicine. Pregnancy If you are about to stop having your period (premenopausal) and you may become pregnant, seek counseling before you get pregnant. Take 400 to 800 micrograms (mcg) of folic acid every day if you become pregnant. Ask for birth control (contraception) if you want to prevent pregnancy. Osteoporosis and menopause Osteoporosis is a disease in which the bones lose minerals and strength with aging. This can result in bone fractures. If you are 16 years old or older, or if you are at risk for osteoporosis and fractures, ask your health care provider if you should: Be screened for bone loss. Take a calcium or vitamin D supplement to lower your risk of fractures. Be given hormone replacement therapy (HRT) to treat symptoms of menopause. Follow these instructions at home: Lifestyle Do not use any products that contain nicotine or tobacco, such as cigarettes, e-cigarettes, and chewing tobacco. If you need help quitting, ask your health care provider. Do not use street drugs. Do not share needles. Ask your health care provider for help if you need support or information about quitting drugs. Alcohol use Do not drink alcohol if: Your health care provider tells you not to drink. You are pregnant, may be pregnant, or are planning to become pregnant. If you drink alcohol: Limit how much you use to 0-1 drink a day. Limit intake if you are breastfeeding. Be aware of how much alcohol is in your drink. In the [place] one drink equals one 12 oz bottle of beer (355 mL), one 5 oz glass of wine (148 mL), or one 1 oz glass of hard liquor (44 mL). General instructions Schedule regular health, dental, and eye exams. Stay current with your vaccines. Tell  your health care provider if: You often feel depressed. You have ever been abused or do not feel safe at home. Summary Adopting a healthy lifestyle and getting preventive care are important in promoting health and wellness. Follow your health care provider's instructions about healthy diet, exercising, and getting tested or screened for diseases. Follow your health care provider's instructions on monitoring your cholesterol and blood pressure. This information is not intended to replace advice given to you by your health care provider. Make sure you discuss any questions you have with your health care provider. Document Revised: 01/25/2021 Document Reviewed: 11/10/2018 Elsevier Patient Education  2022 [employer].

## 2021-08-19 ENCOUNTER — Ambulatory Visit (INDEPENDENT_AMBULATORY_CARE_PROVIDER_SITE_OTHER): Payer: Medicare PPO | Admitting: Internal Medicine

## 2021-08-19 ENCOUNTER — Encounter: Payer: Self-pay | Admitting: Internal Medicine

## 2021-08-19 ENCOUNTER — Other Ambulatory Visit: Payer: Self-pay

## 2021-08-19 VITALS — BP 140/70 | HR 64 | Temp 98.1°F | Ht 62.0 in | Wt 183.0 lb

## 2021-08-19 DIAGNOSIS — K2100 Gastro-esophageal reflux disease with esophagitis, without bleeding: Secondary | ICD-10-CM

## 2021-08-19 DIAGNOSIS — E7849 Other hyperlipidemia: Secondary | ICD-10-CM

## 2021-08-19 DIAGNOSIS — Z23 Encounter for immunization: Secondary | ICD-10-CM

## 2021-08-19 DIAGNOSIS — E119 Type 2 diabetes mellitus without complications: Secondary | ICD-10-CM | POA: Diagnosis not present

## 2021-08-19 DIAGNOSIS — K635 Polyp of colon: Secondary | ICD-10-CM

## 2021-08-19 DIAGNOSIS — I10 Essential (primary) hypertension: Secondary | ICD-10-CM | POA: Diagnosis not present

## 2021-08-19 DIAGNOSIS — Z0000 Encounter for general adult medical examination without abnormal findings: Secondary | ICD-10-CM | POA: Diagnosis not present

## 2021-08-19 DIAGNOSIS — M8589 Other specified disorders of bone density and structure, multiple sites: Secondary | ICD-10-CM

## 2021-08-19 MED ORDER — METOPROLOL TARTRATE 50 MG PO TABS: 50.0000 mg | ORAL_TABLET | Freq: Two times a day (BID) | ORAL | 1 refills | Status: DC

## 2021-08-19 MED ORDER — PITAVASTATIN CALCIUM 1 MG PO TABS: 1.0000 mg | ORAL_TABLET | Freq: Every day | ORAL | 5 refills | Status: DC

## 2021-08-19 NOTE — Assessment & Plan Note (Signed)
Chronic BP well controlled Continue metoprolol 50 mg twice daily cmp  

## 2021-08-19 NOTE — Assessment & Plan Note (Signed)
Chronic Not ideally controlled I do not think the fenofibrate is helping-we will discontinue She has tried more than 1 statin in the past, but it has been years We will try pitavastatin 1 mg daily-if tolerated will increase Discussed the benefits of being on a statin Encouraged regular exercise, weight loss and healthy diet Will check lipid panel, CMP 6 weeks after starting medication

## 2021-08-19 NOTE — Assessment & Plan Note (Signed)
Chronic GERD controlled Continue pantoprazole 40 mg qd

## 2021-08-19 NOTE — Assessment & Plan Note (Signed)
Chronic Diet controlled Lab Results  Component Value Date   HGBA1C 6.7 (H) 02/13/2021  Check A1c and urine microalbumin  Encourage regular exercise, weight loss Low sugar/carbohydrate diet

## 2021-08-19 NOTE — Assessment & Plan Note (Signed)
Chronic DEXA up-to-date Encouraged regular exercise Taking vitamin D

## 2021-08-21 DIAGNOSIS — M1990 Unspecified osteoarthritis, unspecified site: Secondary | ICD-10-CM | POA: Diagnosis not present

## 2021-08-21 DIAGNOSIS — K219 Gastro-esophageal reflux disease without esophagitis: Secondary | ICD-10-CM | POA: Diagnosis not present

## 2021-08-21 DIAGNOSIS — J309 Allergic rhinitis, unspecified: Secondary | ICD-10-CM | POA: Diagnosis not present

## 2021-08-21 DIAGNOSIS — I10 Essential (primary) hypertension: Secondary | ICD-10-CM | POA: Diagnosis not present

## 2021-08-21 DIAGNOSIS — G8929 Other chronic pain: Secondary | ICD-10-CM | POA: Diagnosis not present

## 2021-08-21 DIAGNOSIS — M8580 Other specified disorders of bone density and structure, unspecified site: Secondary | ICD-10-CM | POA: Diagnosis not present

## 2021-08-21 DIAGNOSIS — Z6833 Body mass index (BMI) 33.0-33.9, adult: Secondary | ICD-10-CM | POA: Diagnosis not present

## 2021-08-21 DIAGNOSIS — E785 Hyperlipidemia, unspecified: Secondary | ICD-10-CM | POA: Diagnosis not present

## 2021-08-21 DIAGNOSIS — E669 Obesity, unspecified: Secondary | ICD-10-CM | POA: Diagnosis not present

## 2021-08-22 ENCOUNTER — Other Ambulatory Visit: Payer: Self-pay | Admitting: Physician Assistant

## 2021-09-23 ENCOUNTER — Other Ambulatory Visit (INDEPENDENT_AMBULATORY_CARE_PROVIDER_SITE_OTHER): Payer: Medicare PPO

## 2021-09-23 DIAGNOSIS — I10 Essential (primary) hypertension: Secondary | ICD-10-CM

## 2021-09-23 DIAGNOSIS — E7849 Other hyperlipidemia: Secondary | ICD-10-CM | POA: Diagnosis not present

## 2021-09-23 DIAGNOSIS — E119 Type 2 diabetes mellitus without complications: Secondary | ICD-10-CM | POA: Diagnosis not present

## 2021-09-23 DIAGNOSIS — Z0000 Encounter for general adult medical examination without abnormal findings: Secondary | ICD-10-CM

## 2021-09-23 LAB — CBC WITH DIFFERENTIAL/PLATELET
Basophils Absolute: 0 10*3/uL (ref 0.0–0.1)
Basophils Relative: 0.7 % (ref 0.0–3.0)
Eosinophils Absolute: 0.2 10*3/uL (ref 0.0–0.7)
Eosinophils Relative: 3.2 % (ref 0.0–5.0)
HCT: 44.7 % (ref 36.0–46.0)
Hemoglobin: 14.8 g/dL (ref 12.0–15.0)
Lymphocytes Relative: 38.1 % (ref 12.0–46.0)
Lymphs Abs: 1.9 10*3/uL (ref 0.7–4.0)
MCHC: 33.2 g/dL (ref 30.0–36.0)
MCV: 86.2 fl (ref 78.0–100.0)
Monocytes Absolute: 0.3 10*3/uL (ref 0.1–1.0)
Monocytes Relative: 5.6 % (ref 3.0–12.0)
Neutro Abs: 2.7 10*3/uL (ref 1.4–7.7)
Neutrophils Relative %: 52.4 % (ref 43.0–77.0)
Platelets: 191 10*3/uL (ref 150.0–400.0)
RBC: 5.18 Mil/uL — ABNORMAL HIGH (ref 3.87–5.11)
RDW: 14.6 % (ref 11.5–15.5)
WBC: 5.1 10*3/uL (ref 4.0–10.5)

## 2021-09-23 LAB — COMPREHENSIVE METABOLIC PANEL
ALT: 19 U/L (ref 0–35)
AST: 19 U/L (ref 0–37)
Albumin: 4.3 g/dL (ref 3.5–5.2)
Alkaline Phosphatase: 50 U/L (ref 39–117)
BUN: 22 mg/dL (ref 6–23)
CO2: 27 mEq/L (ref 19–32)
Calcium: 10.5 mg/dL (ref 8.4–10.5)
Chloride: 104 mEq/L (ref 96–112)
Creatinine, Ser: 0.86 mg/dL (ref 0.40–1.20)
GFR: 64.57 mL/min (ref >60.00)
Glucose, Bld: 135 mg/dL — ABNORMAL HIGH (ref 70–99)
Potassium: 4 mEq/L (ref 3.5–5.1)
Sodium: 140 mEq/L (ref 135–145)
Total Bilirubin: 0.7 mg/dL (ref 0.2–1.2)
Total Protein: 7.2 g/dL (ref 6.0–8.3)

## 2021-09-23 LAB — HEMOGLOBIN A1C: Hgb A1c MFr Bld: 6.8 % — ABNORMAL HIGH (ref 4.6–6.5)

## 2021-09-23 LAB — LIPID PANEL
Cholesterol: 219 mg/dL — ABNORMAL HIGH (ref 0–200)
HDL: 43.2 mg/dL (ref >39.00)
NonHDL: 176.06
Total CHOL/HDL Ratio: 5
Triglycerides: 201 mg/dL — ABNORMAL HIGH (ref 0.0–149.0)
VLDL: 40.2 mg/dL — ABNORMAL HIGH (ref 0.0–40.0)

## 2021-09-23 LAB — MICROALBUMIN / CREATININE URINE RATIO
Creatinine,U: 76.5 mg/dL
Microalb Creat Ratio: 0.9 mg/g (ref 0.0–30.0)
Microalb, Ur: <0.7 mg/dL (ref 0.0–1.9)

## 2021-09-23 LAB — TSH: TSH: 3.05 u[IU]/mL (ref 0.35–5.50)

## 2021-09-23 LAB — LDL CHOLESTEROL, DIRECT: Direct LDL: 163 mg/dL

## 2021-09-24 ENCOUNTER — Ambulatory Visit (INDEPENDENT_AMBULATORY_CARE_PROVIDER_SITE_OTHER): Payer: Medicare PPO | Admitting: Physician Assistant

## 2021-09-24 ENCOUNTER — Encounter: Payer: Self-pay | Admitting: Physician Assistant

## 2021-09-24 VITALS — BP 130/68 | HR 62 | Ht 62.0 in | Wt 180.5 lb

## 2021-09-24 DIAGNOSIS — Z86010 Personal history of colonic polyps: Secondary | ICD-10-CM | POA: Diagnosis not present

## 2021-09-24 DIAGNOSIS — R1031 Right lower quadrant pain: Secondary | ICD-10-CM

## 2021-09-24 DIAGNOSIS — Z1211 Encounter for screening for malignant neoplasm of colon: Secondary | ICD-10-CM | POA: Diagnosis not present

## 2021-09-24 NOTE — Progress Notes (Signed)
Subjective:    Patient ID: Virginia [name], female    DOB: [birth year], 80 y.o.   MRN: [id number]  HPI Virginia [name] is a pleasant 80 year old white female, established with Dr. Hilarie Fredrickson who comes in to discuss recall colonoscopy and also with complaints of right lower quadrant pain.  Patient last had colonoscopy in June 2019 due to history of adenomatous polyp with villous component.  She was found to have 3 polyps, the largest was 5 mm and by path all were tubular adenomas with no high-grade dysplasia.  Also with multiple diverticuli and small internal hemorrhoids. Patient says she has been having problems with right lower quadrant pain which has become fairly chronic and present over the past year or so.  He says at times this pain will shift over to the left side of her back.  She has some discomfort radiating from the back on the right into the right lower quadrant and describes the pain as a jabbing type of pain that is present most days.  She has not noticed any change with bowel movements and bowel movements have been normal, no melena or hematochezia.  No change with p.o. intake.  She says she recently went on a trip to [place], had a long flight etc. and says that pain has been increased since that time. She does have history of scoliosis and lumbar disc disease and is status post a prior laminectomy.  She has not had any imaging of her back in a few years.  She says she definitely feels better when she lies down but it does not completely alleviate the pain.  She is not being awakened from sleep by pain. Patient had routine labs done yesterday showing a hemoglobin 14.8 hematocrit of 44.7  Other medical problems include hypertension, GERD, and adult onset diabetes mellitus  Patient says she is not excited about having a follow-up colonoscopy but is concerned about the right lower quadrant pain and wants to have what ever evaluation is necessary to sort that out.  Review of Systems. Pertinent  positive and negative review of systems were noted in the above HPI section.  All other review of systems was otherwise negative.   Outpatient Encounter Medications as of 09/24/2021  Medication Sig   Cholecalciferol (D3 HIGH POTENCY) 50 MCG (2000 UT) CAPS Take by mouth daily.   Loratadine (KS ALLERCLEAR PO) Take 1 tablet by mouth daily.   metoprolol tartrate (LOPRESSOR) 50 MG tablet Take 1 tablet (50 mg total) by mouth 2 (two) times daily.   Multiple Vitamins-Minerals (AIRBORNE GUMMIES PO) Take 2 each by mouth daily.   Omega-3 Fatty Acids (FISH OIL) 1000 MG CAPS Take 1,000 mg by mouth daily. 2000 mg in the morning and 1000 mg at night   pantoprazole (PROTONIX) 40 MG tablet Take 1 tablet (40 mg total) by mouth daily. **PLEASE CALL THE OFFICE TO SCHEDULE APPOINTMENT   [DISCONTINUED] Pitavastatin Calcium 1 MG TABS Take 1 tablet (1 mg total) by mouth daily.   No facility-administered encounter medications on file as of 09/24/2021.   Allergies  Allergen Reactions   Penicillins Hives    Did it involve swelling of the face/tongue/throat, SOB, or low BP? No Did it involve sudden or severe rash/hives, skin peeling, or any reaction on the inside of your mouth or nose? No Did you need to seek medical attention at a hospital or doctor's office? No When did it last happen? many years ago      If all above answers are "NO",  may proceed with cephalosporin use.    Statins Other (See Comments)    pain in base of thumbs   Patient Active Problem List   Diagnosis Date Noted   Diarrhea 02/13/2021   Racing heart beat 02/13/2021   Scoliosis w/ chronic back pain, chiropractor 08/01/2020   Hypertensive retinopathy 07/25/2020   Headache 02/02/2020   Tinnitus 05/24/2019   Pain of toe of right foot 05/24/2019   Left foot pain 05/13/2017   Diabetes mellitus without complication ([place]) 41/66/0630   Constipation 04/16/2016   Bilateral thumb pain 04/16/2016   Dysphagia 10/17/2015   Osteopenia 06/21/2014    Sciatica of right side 05/12/2011   SCHATZKI'S RING 01/01/2010   Osteoarthritis 01/01/2010   FIBROIDS, UTERUS 12/13/2009   Hyperlipidemia 12/13/2009   Essential hypertension 12/13/2009   ALLERGIC RHINITIS 12/13/2009   GERD (gastroesophageal reflux disease) 12/13/2009   COLONIC POLYPS, HX OF 12/13/2009   Social History   Socioeconomic History   Marital status: Widowed    Spouse name: Not on file   Number of children: 2   Years of education: Not on file   Highest education level: Not on file  Occupational History   Occupation: retired  Tobacco Use   Smoking status: Never   Smokeless tobacco: Never   Tobacco comments:    Married, 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  Substance and Sexual Activity   Alcohol use: No    Alcohol/week: 0.0 standard drinks   Drug use: No   Sexual activity: Not Currently  Other Topics Concern   Not on file  Social History Narrative    2 children;   One in [place] and one is in [place]   Retired Pharmacist, hospital   Social Determinants of Radio broadcast assistant Strain: Not on [employer] Insecurity: Not on file  Transportation Needs: Not on file  Physical Activity: Not on file  Stress: Not on file  Social Connections: Not on file  Intimate Partner Violence: Not on file    Virginia [name]'s family history includes Arthritis in her mother; Coronary artery disease in her father; Diabetes in her maternal aunt and paternal aunt; Heart disease in her cousin; Hyperlipidemia in her brother and cousin.      Objective:    Vitals:   09/24/21 1029  BP: 130/68  Pulse: 62    Physical Exam Well-developed well-nourished elderly white female in no acute distress.  Height, Weight, 180 BMI 33.0  HEENT; nontraumatic normocephalic, EOMI, PE R LA, sclera anicteric. Oropharynx; not examined today Neck; supple, no JVD Cardiovascular; regular rate and rhythm with S1-S2, no murmur rub or gallop Pulmonary; Clear  bilaterally Abdomen; soft, nondistended, she has some mild tenderness in the right mid and right lower quadrant, no guarding or rebound -she also feels some discomfort laterally and around into the back on the right no palpable mass or hepatosplenomegaly, bowel sounds are active Rectal; not done today Skin; benign exam, no jaundice rash or appreciable lesions Extremities; no clubbing cyanosis or edema skin warm and dry Neuro/Psych; alert and oriented x4, grossly nonfocal mood and affect appropriate        Assessment & Plan:   #28 80 year old white female with history of adenomatous colon polyps and prior adenoma with villous component, due for follow-up colonoscopy  #2 right mid/right lower quadrant pain-present fairly constantly over the past many months, some exacerbation recently after a trip to [place].  Pain partially alleviated by lying  down, no relationship to p.o. intake or bowel movements that she is aware of.  Pain is described as a sharp or jabbing type pain  #3 history of scoliosis and prior lumbar laminectomy #4 history of GERD 5.  Hypertension 6.  Adult onset diabetes mellitus  Plan; Will proceed with CT of the abdomen and pelvis as initial work-up.  If this is unrevealing then patient will be willing to proceed with follow-up colonoscopy which would be scheduled with Dr. Hilarie Fredrickson. If above GI evaluation is negative she will need imaging of her lower back, probably via Dr. Quay Burow.  Zaryan Yakubov Genia Harold PA-C 09/24/2021   Cc: [name], MD

## 2021-09-24 NOTE — Patient Instructions (Signed)
If you are age 78 or older, your body mass index should be between 23-30. Your Body mass index is 33.01 kg/m. If this is out of the aforementioned range listed, please consider follow up with your Primary Care Provider.  ________________________________________________________  The [facility] providers would like to encourage you to use [facility] to communicate with providers for non-urgent requests or questions.  Due to long hold times on the telephone, sending your provider a message by [facility] may be a faster and more efficient way to get a response.  Please allow 48 business hours for a response.  Please remember that this is for non-urgent requests.   You have been scheduled for a CT scan of the abdomen and pelvis at [facility] ([address]---this is in the same building as [employer]).   You are scheduled on 10/02/2021 at 10:30 am. You should arrive 15 minutes prior to your appointment time for registration. Please follow the written instructions below on the day of your exam:  WARNING: IF YOU ARE ALLERGIC TO IODINE/X-RAY DYE, PLEASE NOTIFY RADIOLOGY IMMEDIATELY AT [phone]! YOU WILL BE GIVEN A 13 HOUR PREMEDICATION PREP.  1) Do not eat anything after 6:30 am (4 hours prior to your test) 2) You have been given 2 bottles of oral contrast to drink. The solution may taste better if refrigerated, but do NOT add ice or any other liquid to this solution. Shake well before drinking.    Drink 1 bottle of contrast @ 8:30 am (2 hours prior to your exam)  Drink 1 bottle of contrast @ 9:30 am (1 hour prior to your exam)  You may take any medications as prescribed with a small amount of water, if necessary. If you take any of the following medications: METFORMIN, GLUCOPHAGE, GLUCOVANCE, AVANDAMET, RIOMET, FORTAMET, [place] MET, JANUMET, GLUMETZA or METAGLIP, you MAY be asked to HOLD this medication 48 hours AFTER the exam.  The purpose of you drinking the oral contrast is  to aid in the visualization of your intestinal tract. The contrast solution may cause some diarrhea. Depending on your individual set of symptoms, you may also receive an intravenous injection of x-ray contrast/dye. Plan on being at [facility] for 30 minutes or longer, depending on the type of exam you are having performed.  This test typically takes 30-45 minutes to complete.  If you have any questions regarding your exam or if you need to reschedule, you may call the CT department at [phone] between the hours of 8:00 am and 5:00 pm, Monday-Friday.  Follow up pending the results of your CT.

## 2021-09-25 ENCOUNTER — Encounter: Payer: Self-pay | Admitting: Internal Medicine

## 2021-09-25 NOTE — Progress Notes (Signed)
Addendum: Reviewed and agree with assessment and management plan. Ming Kunka M, MD  

## 2021-09-27 MED ORDER — ROSUVASTATIN CALCIUM 5 MG PO TABS: 5.0000 mg | ORAL_TABLET | Freq: Every day | ORAL | 3 refills | Status: DC

## 2021-10-02 ENCOUNTER — Other Ambulatory Visit: Payer: Medicare PPO

## 2021-10-19 ENCOUNTER — Other Ambulatory Visit: Payer: Self-pay | Admitting: Internal Medicine

## 2021-11-05 DIAGNOSIS — M5441 Lumbago with sciatica, right side: Secondary | ICD-10-CM | POA: Diagnosis not present

## 2021-11-05 DIAGNOSIS — M9903 Segmental and somatic dysfunction of lumbar region: Secondary | ICD-10-CM | POA: Diagnosis not present

## 2021-11-08 DIAGNOSIS — M5441 Lumbago with sciatica, right side: Secondary | ICD-10-CM | POA: Diagnosis not present

## 2021-11-08 DIAGNOSIS — M9903 Segmental and somatic dysfunction of lumbar region: Secondary | ICD-10-CM | POA: Diagnosis not present

## 2021-11-14 DIAGNOSIS — M5441 Lumbago with sciatica, right side: Secondary | ICD-10-CM | POA: Diagnosis not present

## 2021-11-14 DIAGNOSIS — M9903 Segmental and somatic dysfunction of lumbar region: Secondary | ICD-10-CM | POA: Diagnosis not present

## 2021-11-19 ENCOUNTER — Other Ambulatory Visit: Payer: Self-pay | Admitting: Physician Assistant

## 2021-11-20 DIAGNOSIS — M5441 Lumbago with sciatica, right side: Secondary | ICD-10-CM | POA: Diagnosis not present

## 2021-11-20 DIAGNOSIS — M9903 Segmental and somatic dysfunction of lumbar region: Secondary | ICD-10-CM | POA: Diagnosis not present

## 2021-11-28 DIAGNOSIS — M9903 Segmental and somatic dysfunction of lumbar region: Secondary | ICD-10-CM | POA: Diagnosis not present

## 2021-11-28 DIAGNOSIS — M5441 Lumbago with sciatica, right side: Secondary | ICD-10-CM | POA: Diagnosis not present

## 2021-12-12 DIAGNOSIS — M9903 Segmental and somatic dysfunction of lumbar region: Secondary | ICD-10-CM | POA: Diagnosis not present

## 2021-12-12 DIAGNOSIS — M5441 Lumbago with sciatica, right side: Secondary | ICD-10-CM | POA: Diagnosis not present

## 2021-12-13 ENCOUNTER — Other Ambulatory Visit: Payer: Medicare PPO

## 2021-12-18 DIAGNOSIS — M9903 Segmental and somatic dysfunction of lumbar region: Secondary | ICD-10-CM | POA: Diagnosis not present

## 2021-12-18 DIAGNOSIS — M5441 Lumbago with sciatica, right side: Secondary | ICD-10-CM | POA: Diagnosis not present

## 2021-12-19 ENCOUNTER — Telehealth: Payer: Self-pay | Admitting: Physician Assistant

## 2021-12-19 NOTE — Telephone Encounter (Signed)
Patient states she is seeing a chiropractor weekly for her scoliosis. She feels her back is the cause for the RLQ pain she had experienced. She tells me this is"not really an issue now" and does not want to have the CT scan. The patient was seen for this 09/24/21. She does want to have the colonoscopy for screening purposes. Please advise. Thanks

## 2021-12-19 NOTE — Telephone Encounter (Signed)
Inbound call from patient states she does not feel the CT is necessary because she is doing better. She would like a call back to discuss if she should go through with the CT

## 2021-12-24 ENCOUNTER — Telehealth: Payer: Self-pay

## 2021-12-24 NOTE — Telephone Encounter (Signed)
Is the patient okay to be scheduled for a colonoscopy with Dr Hilarie Fredrickson?

## 2021-12-24 NOTE — Telephone Encounter (Signed)
Scheduled colonoscopy on 01/21/22 in [place] with Dr. Hilarie Fredrickson. Scheduled Pre-visit on 01/14/22 @ 1:30 pm on the phone. Patient aware of these appts. And agrees.

## 2021-12-25 ENCOUNTER — Other Ambulatory Visit: Payer: Medicare PPO

## 2021-12-30 DIAGNOSIS — M5441 Lumbago with sciatica, right side: Secondary | ICD-10-CM | POA: Diagnosis not present

## 2021-12-30 DIAGNOSIS — M9903 Segmental and somatic dysfunction of lumbar region: Secondary | ICD-10-CM | POA: Diagnosis not present

## 2022-01-06 DIAGNOSIS — M9903 Segmental and somatic dysfunction of lumbar region: Secondary | ICD-10-CM | POA: Diagnosis not present

## 2022-01-06 DIAGNOSIS — M5441 Lumbago with sciatica, right side: Secondary | ICD-10-CM | POA: Diagnosis not present

## 2022-01-13 DIAGNOSIS — M9903 Segmental and somatic dysfunction of lumbar region: Secondary | ICD-10-CM | POA: Diagnosis not present

## 2022-01-13 DIAGNOSIS — M5441 Lumbago with sciatica, right side: Secondary | ICD-10-CM | POA: Diagnosis not present

## 2022-01-14 ENCOUNTER — Other Ambulatory Visit: Payer: Self-pay

## 2022-01-14 ENCOUNTER — Other Ambulatory Visit: Payer: Self-pay | Admitting: Internal Medicine

## 2022-01-14 ENCOUNTER — Ambulatory Visit (AMBULATORY_SURGERY_CENTER): Payer: Self-pay

## 2022-01-14 VITALS — Ht 62.0 in | Wt 180.8 lb

## 2022-01-14 DIAGNOSIS — Z86010 Personal history of colonic polyps: Secondary | ICD-10-CM

## 2022-01-14 MED ORDER — PLENVU 140 G PO SOLR: 1.0000 | Freq: Once | ORAL | 0 refills | Status: AC

## 2022-01-14 NOTE — Progress Notes (Signed)
.  extr

## 2022-01-17 ENCOUNTER — Encounter: Payer: Self-pay | Admitting: Internal Medicine

## 2022-01-21 ENCOUNTER — Encounter: Payer: Self-pay | Admitting: Internal Medicine

## 2022-01-21 ENCOUNTER — Ambulatory Visit (AMBULATORY_SURGERY_CENTER): Payer: Medicare PPO | Admitting: Internal Medicine

## 2022-01-21 VITALS — BP 167/69 | HR 55 | Temp 97.5°F | Resp 14 | Ht 62.0 in | Wt 180.0 lb

## 2022-01-21 DIAGNOSIS — D123 Benign neoplasm of transverse colon: Secondary | ICD-10-CM | POA: Diagnosis not present

## 2022-01-21 DIAGNOSIS — Z860100 Personal history of colon polyps, unspecified: Secondary | ICD-10-CM

## 2022-01-21 DIAGNOSIS — D122 Benign neoplasm of ascending colon: Secondary | ICD-10-CM | POA: Diagnosis not present

## 2022-01-21 DIAGNOSIS — D125 Benign neoplasm of sigmoid colon: Secondary | ICD-10-CM | POA: Diagnosis not present

## 2022-01-21 DIAGNOSIS — D120 Benign neoplasm of cecum: Secondary | ICD-10-CM

## 2022-01-21 MED ORDER — SODIUM CHLORIDE 0.9 % IV SOLN
500.0000 mL | Freq: Once | INTRAVENOUS | Status: DC
Start: 2022-01-21 — End: 2022-01-21

## 2022-01-21 NOTE — Progress Notes (Signed)
GASTROENTEROLOGY PROCEDURE H&P NOTE   Primary Care Physician: [name], MD    Reason for Procedure:  History of colon polyps  Plan:    Colonoscopy  Patient is appropriate for endoscopic procedure(s) in the ambulatory ([facility]) setting.  The nature of the procedure, as well as the risks, benefits, and alternatives were carefully and thoroughly reviewed with the patient. Ample time for discussion and questions allowed. The patient understood, was satisfied, and agreed to proceed.     HPI: Virginia [name] is a 81 y.o. female who presents for surveillance colonoscopy.  Medical history as below.  Tolerated the prep.  No recent chest pain or shortness of breath.  No abdominal pain today.  Past Medical History:  Diagnosis Date   ALLERGIC RHINITIS    Allergy    Anemia    Angiectasia 06/30/2019   small- in the small bowel   Anxiety    Blood transfusion without reported diagnosis    Cataract    beginning stage in lt. eye   Chronic kidney disease    Diverticulosis    FIBROIDS, UTERUS    s/p fibroidectomy   GERD    HYPERLIPIDEMIA    intol of statins (myalgia, thumb pain)   HYPERTENSION    Intestinal ulcer 06/30/2019   2 large ulcerations in the mid to distal small bowel (seen on capsule endoscopy)   OSTEOARTHRITIS    Osteopenia 06/21/2014   DEXA@LB  06/14/14: -1.6   SCHATZKI'S RING 11/1999, 08/2009   s/p dilation   Scoliosis    Tubular adenoma of colon     Past Surgical History:  Procedure Laterality Date   child birth  88 & 53   COLONOSCOPY     ESOPHAGEAL DILATION N/A    Fibroidectomy     Low back surgery  01/1994   2 bulging disc   POLYPECTOMY     TONSILLECTOMY  1968   UPPER GASTROINTESTINAL ENDOSCOPY      Prior to Admission medications   Medication Sig Start Date End Date Taking? Authorizing Provider  Cholecalciferol (D3 HIGH POTENCY) 50 MCG (2000 UT) CAPS Take by mouth daily.   Yes [provider]  Loratadine (KS ALLERCLEAR PO) Take 1 tablet  by mouth daily.   Yes [provider]  metoprolol tartrate (LOPRESSOR) 50 MG tablet Take 1 tablet (50 mg total) by mouth 2 (two) times daily. 08/19/21  Yes Burns, Claudina Lick, MD  Multiple Vitamins-Minerals (AIRBORNE GUMMIES PO) Take 2 each by mouth daily.   Yes [provider]  Omega-3 Fatty Acids (FISH OIL) 1000 MG CAPS Take 1,000 mg by mouth daily. 2000 mg in the morning and 1000 mg at night   Yes [provider]  pantoprazole (PROTONIX) 40 MG tablet Take 1 tablet (40 mg total) by mouth daily. **PLEASE CALL THE OFFICE TO SCHEDULE APPOINTMENT 11/19/21  Yes Esterwood, Amy S, PA-C  rosuvastatin (CRESTOR) 5 MG tablet Take 1 tablet (5 mg total) by mouth daily. Patient taking differently: Take 5 mg by mouth daily. Takes only 3 days a week 09/27/21  Yes Burns, Claudina Lick, MD    Current Outpatient Medications  Medication Sig Dispense Refill   Cholecalciferol (D3 HIGH POTENCY) 50 MCG (2000 UT) CAPS Take by mouth daily.     Loratadine (KS ALLERCLEAR PO) Take 1 tablet by mouth daily.     metoprolol tartrate (LOPRESSOR) 50 MG tablet Take 1 tablet (50 mg total) by mouth 2 (two) times daily. 180 tablet 1   Multiple Vitamins-Minerals (AIRBORNE GUMMIES  PO) Take 2 each by mouth daily.     Omega-3 Fatty Acids (FISH OIL) 1000 MG CAPS Take 1,000 mg by mouth daily. 2000 mg in the morning and 1000 mg at night     pantoprazole (PROTONIX) 40 MG tablet Take 1 tablet (40 mg total) by mouth daily. **PLEASE CALL THE OFFICE TO SCHEDULE APPOINTMENT 90 tablet 3   rosuvastatin (CRESTOR) 5 MG tablet Take 1 tablet (5 mg total) by mouth daily. (Patient taking differently: Take 5 mg by mouth daily. Takes only 3 days a week) 13 tablet 3   Current Facility-Administered Medications  Medication Dose Route Frequency Provider Last Rate Last Admin   0.9 %  sodium chloride infusion  500 mL Intravenous Once Inessa Wardrop, Virginia Lines, MD        Allergies as of 01/21/2022 - Review Complete 01/21/2022  Allergen Reaction Noted    Penicillins Hives    Statins Other (See Comments) 12/13/2009    Family History  Problem Relation Age of Onset   Arthritis Mother    Coronary artery disease Father    Hyperlipidemia Brother    Diabetes Maternal Aunt    Hyperlipidemia Cousin    Heart disease Cousin    Diabetes Paternal Aunt    Colon cancer Neg Hx    Colon polyps Neg Hx    Esophageal cancer Neg Hx    Stomach cancer Neg Hx    Rectal cancer Neg Hx     Social History   Socioeconomic History   Marital status: Widowed    Spouse name: Not on file   Number of children: 2   Years of education: Not on file   Highest education level: Not on file  Occupational History   Occupation: retired  Tobacco Use   Smoking status: Never   Smokeless tobacco: Never   Tobacco comments:    Married, 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  Substance and Sexual Activity   Alcohol use: No    Alcohol/week: 0.0 standard drinks   Drug use: No   Sexual activity: Not Currently  Other Topics Concern   Not on file  Social History Narrative    2 children;   One in [place] and one is in [place]   Retired Pharmacist, hospital   Social Determinants of Radio broadcast assistant Strain: Not on file  Food Insecurity: Not on file  Transportation Needs: Not on file  Physical Activity: Not on file  Stress: Not on file  Social Connections: Not on file  Intimate Partner Violence: Not on file    Physical Exam: Vital signs in last 24 hours: @BP  (!) 158/79    Pulse 63    Temp (!) 97.5 F (36.4 C) (Temporal)    Ht 5\' 2"  (1.575 m)    Wt 180 lb (81.6 kg)    SpO2 96%    BMI 32.92 kg/m  GEN: NAD EYE: Sclerae anicteric ENT: MMM CV: Non-tachycardic Pulm: CTA b/l GI: Soft, NT/ND NEURO:  Alert & Oriented x 3   Zenovia Jarred, MD [name]  01/21/2022 10:23 AM

## 2022-01-21 NOTE — Progress Notes (Signed)
A and O x3. Report to RN. Tolerated MAC anesthesia well.

## 2022-01-21 NOTE — Op Note (Signed)
[facility] Patient Name: Virginia [name] Procedure Date: 01/21/2022 10:19 AM MRN: [id number] Endoscopist: Jerene Bears , MD Age: 81 Referring MD:  Date of Birth: 08-27-1941 Gender: Female Account #: [id number] Procedure:                Colonoscopy Indications:              High risk colon cancer surveillance: Personal                            history of multiple adenomas including those with                            villous architecture, Last colonoscopy: June 2019 Medicines:                Monitored Anesthesia Care Procedure:                Pre-Anesthesia Assessment:                           - Prior to the procedure, a History and Physical                            was performed, and patient medications and                            allergies were reviewed. The patient's tolerance of                            previous anesthesia was also reviewed. The risks                            and benefits of the procedure and the sedation                            options and risks were discussed with the patient.                            All questions were answered, and informed consent                            was obtained. Prior Anticoagulants: The patient has                            taken no previous anticoagulant or antiplatelet                            agents. ASA Grade Assessment: II - A patient with                            mild systemic disease. After reviewing the risks                            and benefits, the patient was deemed in  satisfactory condition to undergo the procedure.                           After obtaining informed consent, the colonoscope                            was passed under direct vision. Throughout the                            procedure, the patient's blood pressure, pulse, and                            oxygen saturations were monitored continuously. The                            Olympus  PCF-H190DL ([id number]) Colonoscope was                            introduced through the anus and advanced to the                            cecum. The colonoscopy was performed without                            difficulty. The patient tolerated the procedure                            well. The quality of the bowel preparation was                            good. The ileocecal valve, appendiceal orifice, and                            rectum were photographed. Scope In: 10:34:32 AM Scope Out: 51:76:16 AM Scope Withdrawal Time: 0 hours 13 minutes 1 second  Total Procedure Duration: 0 hours 21 minutes 46 seconds  Findings:                 The digital rectal exam was normal.                           A 3 mm polyp was found in the cecum. The polyp was                            sessile. The polyp was removed with a cold snare.                            Resection and retrieval were complete.                           A 4 mm polyp was found in the ascending colon. The                            polyp was sessile. The polyp was removed  with a                            cold snare. Resection and retrieval were complete.                           A 8 mm polyp was found in the descending colon. The                            polyp was sessile. The polyp was removed with a                            cold snare. Resection and retrieval were complete.                           A 3 mm polyp was found in the sigmoid colon. The                            polyp was sessile. The polyp was removed with a                            cold biopsy forceps. Resection and retrieval were                            complete.                           Multiple small and large-mouthed diverticula were                            found in the sigmoid colon and descending colon.                           Internal hemorrhoids were found during                            retroflexion. The hemorrhoids were  small. Complications:            No immediate complications. Estimated Blood Loss:     Estimated blood loss was minimal. Impression:               - One 3 mm polyp in the cecum, removed with a cold                            snare. Resected and retrieved.                           - One 4 mm polyp in the ascending colon, removed                            with a cold snare. Resected and retrieved.                           - One 8 mm polyp in the descending colon, removed  with a cold snare. Resected and retrieved.                           - One 3 mm polyp in the sigmoid colon, removed with                            a cold biopsy forceps. Resected and retrieved.                           - Diverticulosis in the sigmoid colon and in the                            descending colon.                           - Small internal hemorrhoids. Recommendation:           - Patient has a contact number available for                            emergencies. The signs and symptoms of potential                            delayed complications were discussed with the                            patient. Return to normal activities tomorrow.                            Written discharge instructions were provided to the                            patient.                           - Resume previous diet.                           - Continue present medications.                           - Await pathology results.                           - No repeat colonoscopy due to age. Jerene Bears, MD 01/21/2022 11:05:31 AM This report has been signed electronically.

## 2022-01-21 NOTE — Progress Notes (Signed)
Called to room to assist during endoscopic procedure.  Patient ID and intended procedure confirmed with present staff. Received instructions for my participation in the procedure from the performing physician.  

## 2022-01-21 NOTE — Patient Instructions (Signed)
.  Handout on polyps, diverticulosis given.    YOU HAD AN ENDOSCOPIC PROCEDURE TODAY AT THE [facility]:   Refer to the procedure report that was given to you for any specific questions about what was found during the examination.  If the procedure report does not answer your questions, please call your gastroenterologist to clarify.  If you requested that your care partner not be given the details of your procedure findings, then the procedure report has been included in a sealed envelope for you to review at your convenience later.  YOU SHOULD EXPECT: Some feelings of bloating in the abdomen. Passage of more gas than usual.  Walking can help get rid of the air that was put into your GI tract during the procedure and reduce the bloating. If you had a lower endoscopy (such as a colonoscopy or flexible sigmoidoscopy) you may notice spotting of blood in your stool or on the toilet paper. If you underwent a bowel prep for your procedure, you may not have a normal bowel movement for a few days.  Please Note:  You might notice some irritation and congestion in your nose or some drainage.  This is from the oxygen used during your procedure.  There is no need for concern and it should clear up in a day or so.  SYMPTOMS TO REPORT IMMEDIATELY:   Following lower endoscopy (colonoscopy or flexible sigmoidoscopy):  Excessive amounts of blood in the stool  Significant tenderness or worsening of abdominal pains  Swelling of the abdomen that is new, acute  Fever of 100F or higher   For urgent or emergent issues, a gastroenterologist can be reached at any hour by calling [phone]. Do not use MyChart messaging for urgent concerns.    DIET:  We do recommend a small meal at first, but then you may proceed to your regular diet.  Drink plenty of fluids but you should avoid alcoholic beverages for 24 hours.  ACTIVITY:  You should plan to take it easy for the rest of today and you should NOT  DRIVE or use heavy machinery until tomorrow (because of the sedation medicines used during the test).    FOLLOW UP: Our staff will call the number listed on your records 48-72 hours following your procedure to check on you and address any questions or concerns that you may have regarding the information given to you following your procedure. If we do not reach you, we will leave a message.  We will attempt to reach you two times.  During this call, we will ask if you have developed any symptoms of COVID 19. If you develop any symptoms (ie: fever, flu-like symptoms, shortness of breath, cough etc.) before then, please call [phone].  If you test positive for Covid 19 in the 2 weeks post procedure, please call and report this information to us.    If any biopsies were taken you will be contacted by phone or by letter within the next 1-3 weeks.  Please call us at [phone] if you have not heard about the biopsies in 3 weeks.    SIGNATURES/CONFIDENTIALITY: You and/or your care partner have signed paperwork which will be entered into your electronic medical record.  These signatures attest to the fact that that the information above on your After Visit Summary has been reviewed and is understood.  Full responsibility of the confidentiality of this discharge information lies with you and/or your care-partner. 

## 2022-01-21 NOTE — Progress Notes (Signed)
VS  DT ? ?Pt's states no medical or surgical changes since previsit or office visit. ? ?

## 2022-01-22 ENCOUNTER — Other Ambulatory Visit: Payer: Self-pay | Admitting: Internal Medicine

## 2022-01-23 ENCOUNTER — Telehealth: Payer: Self-pay

## 2022-01-23 NOTE — Telephone Encounter (Signed)
°  Follow up Call-  Call back number 01/21/2022 06/24/2019  Post procedure Call Back phone  # [phone]  Permission to leave phone message Yes Yes  Some recent data might be hidden     Patient questions:  Do you have a fever, pain , or abdominal swelling? No. Pain Score  0 *  Have you tolerated food without any problems? Yes.    Have you been able to return to your normal activities? Yes.    Do you have any questions about your discharge instructions: Diet   No. Medications  No. Follow up visit  No.  Do you have questions or concerns about your Care? No.  Actions: * If pain score is 4 or above: No action needed, pain <4. Have you developed a fever since your procedure? no  2.   Have you had an respiratory symptoms (SOB or cough) since your procedure? no  3.   Have you tested positive for COVID 19 since your procedure no  4.   Have you had any family members/close contacts diagnosed with the COVID 19 since your procedure?  no   If yes to any of these questions please route to Joylene John, RN and Joella Prince, RN

## 2022-01-27 ENCOUNTER — Encounter: Payer: Self-pay | Admitting: Internal Medicine

## 2022-01-27 DIAGNOSIS — M9903 Segmental and somatic dysfunction of lumbar region: Secondary | ICD-10-CM | POA: Diagnosis not present

## 2022-01-27 DIAGNOSIS — M5441 Lumbago with sciatica, right side: Secondary | ICD-10-CM | POA: Diagnosis not present

## 2022-02-10 DIAGNOSIS — M9903 Segmental and somatic dysfunction of lumbar region: Secondary | ICD-10-CM | POA: Diagnosis not present

## 2022-02-10 DIAGNOSIS — M5441 Lumbago with sciatica, right side: Secondary | ICD-10-CM | POA: Diagnosis not present

## 2022-02-16 ENCOUNTER — Encounter: Payer: Self-pay | Admitting: Internal Medicine

## 2022-02-16 NOTE — Progress Notes (Signed)
? ? ? ? ?Subjective:  ? ? Patient ID: Virginia [name], female    DOB: Jan 03, 1941, 81 y.o.   MRN: [id number] ? ?This visit occurred during the SARS-CoV-2 public health emergency.  Safety protocols were in place, including screening questions prior to the visit, additional usage of staff PPE, and extensive cleaning of exam room while observing appropriate contact time as indicated for disinfecting solutions.   ? ? ?HPI ?Afra is here for follow up of her chronic medical problems, including htn, DM, hld ? ? ?Feels foggy headed at times-not sure why.  Wondered if it was related to the Crestor. ? ?Snoring, had a witnessed apnea recently at a friend's house.   ? ?BM's loose most of the time.  One BM a day.  She wonders if it is related to some of the things that she eats.  Had recent colonoscopy. ? ?Medications and allergies reviewed with patient and updated if appropriate. ? ?Current Outpatient Medications on File Prior to Visit  ?Medication Sig Dispense Refill  ? Cholecalciferol (D3 HIGH POTENCY) 50 MCG (2000 UT) CAPS Take by mouth daily.    ? Loratadine (KS ALLERCLEAR PO) Take 1 tablet by mouth daily.    ? metoprolol tartrate (LOPRESSOR) 50 MG tablet Take 1 tablet (50 mg total) by mouth 2 (two) times daily. 180 tablet 1  ? Multiple Vitamins-Minerals (AIRBORNE GUMMIES PO) Take 2 each by mouth daily.    ? Omega-3 Fatty Acids (FISH OIL) 1000 MG CAPS Take 1,000 mg by mouth daily. 2000 mg in the morning and 1000 mg at night    ? pantoprazole (PROTONIX) 40 MG tablet Take 1 tablet (40 mg total) by mouth daily. **PLEASE CALL THE OFFICE TO SCHEDULE APPOINTMENT 90 tablet 3  ? ?No current facility-administered medications on file prior to visit.  ? ? ? ?Review of Systems  ?Constitutional:  Negative for chills and fever.  ?HENT:  Positive for sinus pressure (slight on right side). Negative for congestion, sinus pain and sore throat.   ?Eyes:  Positive for itching (allergy related).  ?Respiratory:  Positive for cough (occ if  outside - allergies). Negative for shortness of breath and wheezing.   ?Cardiovascular:  Negative for chest pain, palpitations and leg swelling.  ?Neurological:  Positive for light-headedness (at times). Negative for dizziness and headaches.  ?     Feels foggy at times  ? ?   ?Objective:  ? ?Vitals:  ? 02/17/22 1025  ?BP: 136/70  ?Pulse: 77  ?Temp: 98 ?F (36.7 ?C)  ?SpO2: 97%  ? ?BP Readings from Last 3 Encounters:  ?02/17/22 136/70  ?01/21/22 (!) 167/69  ?09/24/21 130/68  ? ?Wt Readings from Last 3 Encounters:  ?02/17/22 181 lb (82.1 kg)  ?01/21/22 180 lb (81.6 kg)  ?01/14/22 180 lb 12.8 oz (82 kg)  ? ?Body mass index is 33.11 kg/m?. ? ?  ?Physical Exam ?Constitutional:   ?   General: She is not in acute distress. ?   Appearance: Normal appearance.  ?HENT:  ?   Head: Normocephalic and atraumatic.  ?Eyes:  ?   Conjunctiva/sclera: Conjunctivae normal.  ?Cardiovascular:  ?   Rate and Rhythm: Normal rate and regular rhythm.  ?   Heart sounds: Normal heart sounds. No murmur heard. ?Pulmonary:  ?   Effort: Pulmonary effort is normal. No respiratory distress.  ?   Breath sounds: Normal breath sounds. No wheezing.  ?Musculoskeletal:  ?   Cervical back: Neck supple.  ?   Right lower leg: No edema.  ?  Left lower leg: No edema.  ?Lymphadenopathy:  ?   Cervical: No cervical adenopathy.  ?Skin: ?   Findings: No rash.  ?Neurological:  ?   Mental Status: She is alert. Mental status is at baseline.  ?Psychiatric:     ?   Mood and Affect: Mood normal.     ?   Behavior: Behavior normal.  ? ?   ? ?Lab Results  ?Component Value Date  ? WBC 5.1 09/23/2021  ? HGB 14.8 09/23/2021  ? HCT 44.7 09/23/2021  ? PLT 191.0 09/23/2021  ? GLUCOSE 135 (H) 09/23/2021  ? CHOL 219 (H) 09/23/2021  ? TRIG 201.0 (H) 09/23/2021  ? HDL 43.20 09/23/2021  ? LDLDIRECT 163.0 09/23/2021  ? LDLCALC 123 (H) 08/01/2020  ? ALT 19 09/23/2021  ? AST 19 09/23/2021  ? NA 140 09/23/2021  ? K 4.0 09/23/2021  ? CL 104 09/23/2021  ? CREATININE 0.86 09/23/2021  ? BUN 22  09/23/2021  ? CO2 27 09/23/2021  ? TSH 3.05 09/23/2021  ? INR 1.0 05/25/2019  ? HGBA1C 6.8 (H) 09/23/2021  ? MICROALBUR <0.7 09/23/2021  ? ? ? ?Assessment & Plan:  ? ? ?See Problem List for Assessment and Plan of chronic medical problems.  ? ? ?

## 2022-02-16 NOTE — Patient Instructions (Addendum)
? ? ? ?  Blood work was ordered.   ? ? ?Medications changes include :   None ? ? ?A referral was ordered for [facility] to rule out sleep apnea.  ? ? ?Return in about 6 months (around 08/20/2022) for CPE. ? ?

## 2022-02-17 ENCOUNTER — Other Ambulatory Visit: Payer: Self-pay

## 2022-02-17 ENCOUNTER — Ambulatory Visit: Payer: Medicare PPO | Admitting: Internal Medicine

## 2022-02-17 VITALS — BP 136/70 | HR 77 | Temp 98.0°F | Ht 62.0 in | Wt 181.0 lb

## 2022-02-17 DIAGNOSIS — K2100 Gastro-esophageal reflux disease with esophagitis, without bleeding: Secondary | ICD-10-CM | POA: Diagnosis not present

## 2022-02-17 DIAGNOSIS — R195 Other fecal abnormalities: Secondary | ICD-10-CM

## 2022-02-17 DIAGNOSIS — R0683 Snoring: Secondary | ICD-10-CM

## 2022-02-17 DIAGNOSIS — E7849 Other hyperlipidemia: Secondary | ICD-10-CM

## 2022-02-17 DIAGNOSIS — R4189 Other symptoms and signs involving cognitive functions and awareness: Secondary | ICD-10-CM | POA: Insufficient documentation

## 2022-02-17 DIAGNOSIS — I10 Essential (primary) hypertension: Secondary | ICD-10-CM | POA: Diagnosis not present

## 2022-02-17 DIAGNOSIS — R0681 Apnea, not elsewhere classified: Secondary | ICD-10-CM

## 2022-02-17 DIAGNOSIS — E119 Type 2 diabetes mellitus without complications: Secondary | ICD-10-CM

## 2022-02-17 LAB — COMPREHENSIVE METABOLIC PANEL
ALT: 16 U/L (ref 0–35)
AST: 15 U/L (ref 0–37)
Albumin: 3.9 g/dL (ref 3.5–5.2)
Alkaline Phosphatase: 51 U/L (ref 39–117)
BUN: 19 mg/dL (ref 6–23)
CO2: 32 mEq/L (ref 19–32)
Calcium: 10.5 mg/dL (ref 8.4–10.5)
Chloride: 102 mEq/L (ref 96–112)
Creatinine, Ser: 0.7 mg/dL (ref 0.40–1.20)
GFR: 82.43 mL/min (ref >60.00)
Glucose, Bld: 146 mg/dL — ABNORMAL HIGH (ref 70–99)
Potassium: 4.5 mEq/L (ref 3.5–5.1)
Sodium: 139 mEq/L (ref 135–145)
Total Bilirubin: 0.7 mg/dL (ref 0.2–1.2)
Total Protein: 6.4 g/dL (ref 6.0–8.3)

## 2022-02-17 LAB — LIPID PANEL
Cholesterol: 223 mg/dL — ABNORMAL HIGH (ref 0–200)
HDL: 41.5 mg/dL (ref >39.00)
NonHDL: 181.15
Total CHOL/HDL Ratio: 5
Triglycerides: 344 mg/dL — ABNORMAL HIGH (ref 0.0–149.0)
VLDL: 68.8 mg/dL — ABNORMAL HIGH (ref 0.0–40.0)

## 2022-02-17 LAB — HEMOGLOBIN A1C: Hgb A1c MFr Bld: 6.9 % — ABNORMAL HIGH (ref 4.6–6.5)

## 2022-02-17 LAB — LDL CHOLESTEROL, DIRECT: Direct LDL: 149 mg/dL

## 2022-02-17 MED ORDER — ROSUVASTATIN CALCIUM 5 MG PO TABS: 5.0000 mg | ORAL_TABLET | ORAL | 3 refills | Status: DC

## 2022-02-17 NOTE — Assessment & Plan Note (Signed)
Chronic ?History of snoring ?Recent episode of witnessed apnea by her friend ?Refer to neurology to rule out OSA ?

## 2022-02-17 NOTE — Assessment & Plan Note (Signed)
Acute ?States that she feels little foggy ?She felt it could be related to the Crestor, but from me that seems less likely, but okay to stop for couple weeks to see if that improves ??  Allergies ??  Sleep apnea-was referred to neurology to have that evaluated ?Getting blood work done today ?

## 2022-02-17 NOTE — Assessment & Plan Note (Signed)
Chronic ?Lab Results  ?Component Value Date  ? HGBA1C 6.8 (H) 09/23/2021  ? ?Sugars controlled ?Check A1c, urine microalbumin today ?Continue lifestyle control ?Stressed regular exercise, diabetic diet ?Will add medication as needed ? ?

## 2022-02-17 NOTE — Assessment & Plan Note (Signed)
Acute ?States loose stools-but only 1 BM a day ?May be related to what she eats ?Advised that colonoscopy recently showed some polyps, but nothing else concerning therefore likely related to diet ?Can try increasing fiber or adjusting diet to see if that helps ?

## 2022-02-17 NOTE — Assessment & Plan Note (Signed)
Chronic Blood pressure well controlled CMP Continue metoprolol 50 mg twice daily 

## 2022-02-17 NOTE — Assessment & Plan Note (Signed)
Acute ?Her friend witnessed apnea while she was sleeping ?She also snores ?Refer to neurology to rule out OSA ?

## 2022-02-17 NOTE — Assessment & Plan Note (Signed)
Chronic GERD controlled Continue protonix 40 mg daily  

## 2022-02-17 NOTE — Assessment & Plan Note (Addendum)
Chronic ?Regular exercise and healthy diet encouraged ?Check lipid panel  ?Continue rosuvastatin 5 mg 3 times weekly ?

## 2022-02-19 DIAGNOSIS — M9903 Segmental and somatic dysfunction of lumbar region: Secondary | ICD-10-CM | POA: Diagnosis not present

## 2022-02-19 DIAGNOSIS — M5441 Lumbago with sciatica, right side: Secondary | ICD-10-CM | POA: Diagnosis not present

## 2022-02-24 DIAGNOSIS — Z1231 Encounter for screening mammogram for malignant neoplasm of breast: Secondary | ICD-10-CM | POA: Diagnosis not present

## 2022-02-24 LAB — HM MAMMOGRAPHY

## 2022-02-26 DIAGNOSIS — M5441 Lumbago with sciatica, right side: Secondary | ICD-10-CM | POA: Diagnosis not present

## 2022-02-26 DIAGNOSIS — M9903 Segmental and somatic dysfunction of lumbar region: Secondary | ICD-10-CM | POA: Diagnosis not present

## 2022-02-28 ENCOUNTER — Encounter: Payer: Self-pay | Admitting: Internal Medicine

## 2022-02-28 NOTE — Progress Notes (Signed)
Outside notes received. Information abstracted. Notes sent to scan.  

## 2022-03-12 DIAGNOSIS — M9903 Segmental and somatic dysfunction of lumbar region: Secondary | ICD-10-CM | POA: Diagnosis not present

## 2022-03-12 DIAGNOSIS — M5441 Lumbago with sciatica, right side: Secondary | ICD-10-CM | POA: Diagnosis not present

## 2022-03-24 DIAGNOSIS — M5441 Lumbago with sciatica, right side: Secondary | ICD-10-CM | POA: Diagnosis not present

## 2022-03-24 DIAGNOSIS — M9903 Segmental and somatic dysfunction of lumbar region: Secondary | ICD-10-CM | POA: Diagnosis not present

## 2022-04-04 DIAGNOSIS — M5441 Lumbago with sciatica, right side: Secondary | ICD-10-CM | POA: Diagnosis not present

## 2022-04-04 DIAGNOSIS — M9903 Segmental and somatic dysfunction of lumbar region: Secondary | ICD-10-CM | POA: Diagnosis not present

## 2022-04-10 ENCOUNTER — Other Ambulatory Visit: Payer: Self-pay | Admitting: Internal Medicine

## 2022-04-29 DIAGNOSIS — M9903 Segmental and somatic dysfunction of lumbar region: Secondary | ICD-10-CM | POA: Diagnosis not present

## 2022-04-29 DIAGNOSIS — M5441 Lumbago with sciatica, right side: Secondary | ICD-10-CM | POA: Diagnosis not present

## 2022-05-13 DIAGNOSIS — M9903 Segmental and somatic dysfunction of lumbar region: Secondary | ICD-10-CM | POA: Diagnosis not present

## 2022-05-13 DIAGNOSIS — M5441 Lumbago with sciatica, right side: Secondary | ICD-10-CM | POA: Diagnosis not present

## 2022-05-27 DIAGNOSIS — M9903 Segmental and somatic dysfunction of lumbar region: Secondary | ICD-10-CM | POA: Diagnosis not present

## 2022-05-27 DIAGNOSIS — M5441 Lumbago with sciatica, right side: Secondary | ICD-10-CM | POA: Diagnosis not present

## 2022-06-16 DIAGNOSIS — M5441 Lumbago with sciatica, right side: Secondary | ICD-10-CM | POA: Diagnosis not present

## 2022-06-16 DIAGNOSIS — M9903 Segmental and somatic dysfunction of lumbar region: Secondary | ICD-10-CM | POA: Diagnosis not present

## 2022-07-01 DIAGNOSIS — M5441 Lumbago with sciatica, right side: Secondary | ICD-10-CM | POA: Diagnosis not present

## 2022-07-01 DIAGNOSIS — M9903 Segmental and somatic dysfunction of lumbar region: Secondary | ICD-10-CM | POA: Diagnosis not present

## 2022-07-16 DIAGNOSIS — M5441 Lumbago with sciatica, right side: Secondary | ICD-10-CM | POA: Diagnosis not present

## 2022-07-16 DIAGNOSIS — M9903 Segmental and somatic dysfunction of lumbar region: Secondary | ICD-10-CM | POA: Diagnosis not present

## 2022-07-22 ENCOUNTER — Telehealth: Payer: Self-pay | Admitting: Internal Medicine

## 2022-07-22 NOTE — Telephone Encounter (Signed)
Left message for patient to call back to schedule Medicare Annual Wellness Visit   Last AWV  07/29/19  Please schedule at anytime with [facility] if patient calls the office back.     Any questions, please call me at [phone]

## 2022-07-30 DIAGNOSIS — M9903 Segmental and somatic dysfunction of lumbar region: Secondary | ICD-10-CM | POA: Diagnosis not present

## 2022-07-30 DIAGNOSIS — M5441 Lumbago with sciatica, right side: Secondary | ICD-10-CM | POA: Diagnosis not present

## 2022-07-31 DIAGNOSIS — H25813 Combined forms of age-related cataract, bilateral: Secondary | ICD-10-CM | POA: Diagnosis not present

## 2022-07-31 DIAGNOSIS — H43811 Vitreous degeneration, right eye: Secondary | ICD-10-CM | POA: Diagnosis not present

## 2022-07-31 DIAGNOSIS — H04123 Dry eye syndrome of bilateral lacrimal glands: Secondary | ICD-10-CM | POA: Diagnosis not present

## 2022-07-31 DIAGNOSIS — H524 Presbyopia: Secondary | ICD-10-CM | POA: Diagnosis not present

## 2022-08-12 DIAGNOSIS — M9903 Segmental and somatic dysfunction of lumbar region: Secondary | ICD-10-CM | POA: Diagnosis not present

## 2022-08-12 DIAGNOSIS — M5441 Lumbago with sciatica, right side: Secondary | ICD-10-CM | POA: Diagnosis not present

## 2022-08-19 ENCOUNTER — Encounter: Payer: Self-pay | Admitting: Internal Medicine

## 2022-08-19 NOTE — Progress Notes (Unsigned)
Subjective:    Patient ID: Virginia [name], female    DOB: 10-24-1941, 81 y.o.   MRN: [id number]      HPI Shea is here for a Physical exam.   Nothing new.  Has chronic back pain from scoliosis.  Marland Kitchen    Her sister died in the past year.  She thinks it was related to heart disease, but is not sure   Medications and allergies reviewed with patient and updated if appropriate.  Current Outpatient Medications on File Prior to Visit  Medication Sig Dispense Refill   Cholecalciferol (D3 HIGH POTENCY) 50 MCG (2000 UT) CAPS Take by mouth daily.     Loratadine (KS ALLERCLEAR PO) Take 1 tablet by mouth daily.     metoprolol tartrate (LOPRESSOR) 50 MG tablet TAKE 1 TABLET BY MOUTH TWICE A DAY 180 tablet 1   Multiple Vitamins-Minerals (AIRBORNE GUMMIES PO) Take 2 each by mouth daily.     Omega-3 Fatty Acids (FISH OIL) 1000 MG CAPS Take 1,000 mg by mouth daily. 2000 mg in the morning and 1000 mg at night     pantoprazole (PROTONIX) 40 MG tablet Take 1 tablet (40 mg total) by mouth daily. **PLEASE CALL THE OFFICE TO SCHEDULE APPOINTMENT 90 tablet 3   rosuvastatin (CRESTOR) 5 MG tablet Take 1 tablet (5 mg total) by mouth 3 (three) times a week. 13 tablet 3   No current facility-administered medications on file prior to visit.    Review of Systems  Constitutional:  Negative for fever.  HENT:  Positive for sinus pressure (right maxillary - chronic - years). Negative for congestion and trouble swallowing.   Eyes:  Negative for visual disturbance.  Respiratory:  Negative for cough, shortness of breath and wheezing.   Cardiovascular:  Negative for chest pain, palpitations and leg swelling.  Gastrointestinal:  Negative for abdominal pain, blood in stool, constipation, diarrhea and nausea.       No gerd  Genitourinary:  Negative for dysuria.  Musculoskeletal:  Positive for arthralgias and back pain.  Skin:  Negative for rash.  Neurological:  Negative for light-headedness and headaches.   Psychiatric/Behavioral:  Negative for dysphoric mood. The patient is not nervous/anxious.        Objective:   Vitals:   08/20/22 1113  BP: 120/78  Pulse: 65  Temp: (!) 97.5 F (36.4 C)  SpO2: 97%   Filed Weights   08/20/22 1113  Weight: 184 lb (83.5 kg)   Body mass index is 33.65 kg/m.  BP Readings from Last 3 Encounters:  08/20/22 120/78  08/20/22 120/78  02/17/22 136/70    Wt Readings from Last 3 Encounters:  08/20/22 184 lb (83.5 kg)  08/20/22 184 lb (83.5 kg)  02/17/22 181 lb (82.1 kg)       Physical Exam Constitutional: She appears well-developed and well-nourished. No distress.  HENT:  Head: Normocephalic and atraumatic.  Right Ear: External ear normal. Normal ear canal and TM Left Ear: External ear normal.  Normal ear canal and TM Mouth/Throat: Oropharynx is clear and moist.  Eyes: Conjunctivae normal.  Neck: Neck supple. No tracheal deviation present. No thyromegaly present.  No carotid bruit  Cardiovascular: Normal rate, regular rhythm and normal heart sounds.   No murmur heard.  No edema. Pulmonary/Chest: Effort normal and breath sounds normal. No respiratory distress. She has no wheezes. She has no rales.  Breast: deferred   Abdominal: Soft. She exhibits no distension. There is no tenderness.  Lymphadenopathy: She has no cervical adenopathy.  Skin: Skin is warm and dry. She is not diaphoretic.  Psychiatric: She has a normal mood and affect. Her behavior is normal.     Lab Results  Component Value Date   WBC 5.1 09/23/2021   HGB 14.8 09/23/2021   HCT 44.7 09/23/2021   PLT 191.0 09/23/2021   GLUCOSE 146 (H) 02/17/2022   CHOL 223 (H) 02/17/2022   TRIG 344.0 (H) 02/17/2022   HDL 41.50 02/17/2022   LDLDIRECT 149.0 02/17/2022   LDLCALC 123 (H) 08/01/2020   ALT 16 02/17/2022   AST 15 02/17/2022   NA 139 02/17/2022   K 4.5 02/17/2022   CL 102 02/17/2022   CREATININE 0.70 02/17/2022   BUN 19 02/17/2022   CO2 32 02/17/2022   TSH 3.05  09/23/2021   INR 1.0 05/25/2019   HGBA1C 6.9 (H) 02/17/2022   MICROALBUR <0.7 09/23/2021         Assessment & Plan:   Physical exam: Screening blood work  ordered Exercise  no formal exercise, active Weight  encouraged weight loss Substance abuse  none   Reviewed recommended immunizations.   Health Maintenance  Topic Date Due   FOOT EXAM  Never done   TETANUS/TDAP  10/01/2016   OPHTHALMOLOGY EXAM  07/23/2021   COVID-19 Vaccine (4 - Pfizer series) 03/29/2022   HEMOGLOBIN A1C  08/20/2022   Diabetic kidney evaluation - Urine ACR  09/23/2022   DEXA SCAN  09/26/2022   Diabetic kidney evaluation - GFR measurement  02/18/2023   Pneumonia Vaccine 78+ Years old  Completed   INFLUENZA VACCINE  Completed   Hepatitis C Screening  Completed   Zoster Vaccines- Shingrix  Completed   HPV VACCINES  Aged Out   COLONOSCOPY (Pts 45-20yr Insurance coverage will need to be confirmed)  Discontinued          See Problem List for Assessment and Plan of chronic medical problems.

## 2022-08-19 NOTE — Patient Instructions (Addendum)
[facility] [phone]   For your knee arthritis - voltaren gel, bio-freeze, capsaicin cream   Blood work was ordered.     Medications changes include :  start zetia 10 mg daily for your cholesterol - come back in 6-8 weeks to recheck your blood work.   Start rybelsus 3 mg daily   Your prescription(s) have been sent to your pharmacy.     Return in about 6 months (around 02/18/2023) for follow up.   Health Maintenance, Female Adopting a healthy lifestyle and getting preventive care are important in promoting health and wellness. Ask your health care provider about: The right schedule for you to have regular tests and exams. Things you can do on your own to prevent diseases and keep yourself healthy. What should I know about diet, weight, and exercise? Eat a healthy diet  Eat a diet that includes plenty of vegetables, fruits, low-fat dairy products, and lean protein. Do not eat a lot of foods that are high in solid fats, added sugars, or sodium. Maintain a healthy weight Body mass index (BMI) is used to identify weight problems. It estimates body fat based on height and weight. Your health care provider can help determine your BMI and help you achieve or maintain a healthy weight. Get regular exercise Get regular exercise. This is one of the most important things you can do for your health. Most adults should: Exercise for at least 150 minutes each week. The exercise should increase your heart rate and make you sweat (moderate-intensity exercise). Do strengthening exercises at least twice a week. This is in addition to the moderate-intensity exercise. Spend less time sitting. Even light physical activity can be beneficial. Watch cholesterol and blood lipids Have your blood tested for lipids and cholesterol at 79 years of age, then have this test every 5 years. Have your cholesterol levels checked more often if: Your lipid or cholesterol levels are  high. You are older than 79 years of age. You are at high risk for heart disease. What should I know about cancer screening? Depending on your health history and family history, you may need to have cancer screening at various ages. This may include screening for: Breast cancer. Cervical cancer. Colorectal cancer. Skin cancer. Lung cancer. What should I know about heart disease, diabetes, and high blood pressure? Blood pressure and heart disease High blood pressure causes heart disease and increases the risk of stroke. This is more likely to develop in people who have high blood pressure readings or are overweight. Have your blood pressure checked: Every 3-5 years if you are 52-24 years of age. Every year if you are 32 years old or older. Diabetes Have regular diabetes screenings. This checks your fasting blood sugar level. Have the screening done: Once every three years after age 64 if you are at a normal weight and have a low risk for diabetes. More often and at a younger age if you are overweight or have a high risk for diabetes. What should I know about preventing infection? Hepatitis B If you have a higher risk for hepatitis B, you should be screened for this virus. Talk with your health care provider to find out if you are at risk for hepatitis B infection. Hepatitis C Testing is recommended for: Everyone born from [birth year] through 1963. Anyone with known risk factors for hepatitis C. Sexually transmitted infections (STIs) Get screened for STIs, including gonorrhea and chlamydia, if: You are sexually active and are younger than  79 years of age. You are older than 79 years of age and your health care provider tells you that you are at risk for this type of infection. Your sexual activity has changed since you were last screened, and you are at increased risk for chlamydia or gonorrhea. Ask your health care provider if you are at risk. Ask your health care provider about whether you  are at high risk for HIV. Your health care provider may recommend a prescription medicine to help prevent HIV infection. If you choose to take medicine to prevent HIV, you should first get tested for HIV. You should then be tested every 3 months for as long as you are taking the medicine. Pregnancy If you are about to stop having your period (premenopausal) and you may become pregnant, seek counseling before you get pregnant. Take 400 to 800 micrograms (mcg) of folic acid every day if you become pregnant. Ask for birth control (contraception) if you want to prevent pregnancy. Osteoporosis and menopause Osteoporosis is a disease in which the bones lose minerals and strength with aging. This can result in bone fractures. If you are 92 years old or older, or if you are at risk for osteoporosis and fractures, ask your health care provider if you should: Be screened for bone loss. Take a calcium or vitamin D supplement to lower your risk of fractures. Be given hormone replacement therapy (HRT) to treat symptoms of menopause. Follow these instructions at home: Alcohol use Do not drink alcohol if: Your health care provider tells you not to drink. You are pregnant, may be pregnant, or are planning to become pregnant. If you drink alcohol: Limit how much you have to: 0-1 drink a day. Know how much alcohol is in your drink. In the [place] one drink equals one 12 oz bottle of beer (355 mL), one 5 oz glass of wine (148 mL), or one 1 oz glass of hard liquor (44 mL). Lifestyle Do not use any products that contain nicotine or tobacco. These products include cigarettes, chewing tobacco, and vaping devices, such as e-cigarettes. If you need help quitting, ask your health care provider. Do not use street drugs. Do not share needles. Ask your health care provider for help if you need support or information about quitting drugs. General instructions Schedule regular health, dental, and eye exams. Stay current  with your vaccines. Tell your health care provider if: You often feel depressed. You have ever been abused or do not feel safe at home. Summary Adopting a healthy lifestyle and getting preventive care are important in promoting health and wellness. Follow your health care provider's instructions about healthy diet, exercising, and getting tested or screened for diseases. Follow your health care provider's instructions on monitoring your cholesterol and blood pressure. This information is not intended to replace advice given to you by your health care provider. Make sure you discuss any questions you have with your health care provider. Document Revised: 04/08/2021 Document Reviewed: 04/08/2021 Elsevier Patient Education  [place].

## 2022-08-20 ENCOUNTER — Encounter: Payer: Self-pay | Admitting: Internal Medicine

## 2022-08-20 ENCOUNTER — Ambulatory Visit (INDEPENDENT_AMBULATORY_CARE_PROVIDER_SITE_OTHER): Payer: Medicare PPO | Admitting: Internal Medicine

## 2022-08-20 ENCOUNTER — Ambulatory Visit (INDEPENDENT_AMBULATORY_CARE_PROVIDER_SITE_OTHER): Payer: Medicare PPO

## 2022-08-20 VITALS — BP 120/78 | HR 65 | Temp 97.5°F | Resp 16 | Ht 62.0 in | Wt 184.0 lb

## 2022-08-20 VITALS — BP 120/78 | HR 65 | Temp 97.5°F | Ht 62.0 in | Wt 184.0 lb

## 2022-08-20 DIAGNOSIS — M8589 Other specified disorders of bone density and structure, multiple sites: Secondary | ICD-10-CM | POA: Diagnosis not present

## 2022-08-20 DIAGNOSIS — E119 Type 2 diabetes mellitus without complications: Secondary | ICD-10-CM

## 2022-08-20 DIAGNOSIS — I10 Essential (primary) hypertension: Secondary | ICD-10-CM

## 2022-08-20 DIAGNOSIS — Z789 Other specified health status: Secondary | ICD-10-CM | POA: Insufficient documentation

## 2022-08-20 DIAGNOSIS — E7849 Other hyperlipidemia: Secondary | ICD-10-CM | POA: Diagnosis not present

## 2022-08-20 DIAGNOSIS — Z0000 Encounter for general adult medical examination without abnormal findings: Secondary | ICD-10-CM

## 2022-08-20 DIAGNOSIS — K2100 Gastro-esophageal reflux disease with esophagitis, without bleeding: Secondary | ICD-10-CM | POA: Diagnosis not present

## 2022-08-20 DIAGNOSIS — M419 Scoliosis, unspecified: Secondary | ICD-10-CM

## 2022-08-20 DIAGNOSIS — T466X5A Adverse effect of antihyperlipidemic and antiarteriosclerotic drugs, initial encounter: Secondary | ICD-10-CM | POA: Insufficient documentation

## 2022-08-20 DIAGNOSIS — Z23 Encounter for immunization: Secondary | ICD-10-CM

## 2022-08-20 LAB — COMPREHENSIVE METABOLIC PANEL
ALT: 21 U/L (ref 0–35)
AST: 16 U/L (ref 0–37)
Albumin: 3.8 g/dL (ref 3.5–5.2)
Alkaline Phosphatase: 57 U/L (ref 39–117)
BUN: 18 mg/dL (ref 6–23)
CO2: 30 mEq/L (ref 19–32)
Calcium: 10.9 mg/dL — ABNORMAL HIGH (ref 8.4–10.5)
Chloride: 102 mEq/L (ref 96–112)
Creatinine, Ser: 0.68 mg/dL (ref 0.40–1.20)
GFR: 82.72 mL/min (ref >60.00)
Glucose, Bld: 102 mg/dL — ABNORMAL HIGH (ref 70–99)
Potassium: 4.5 mEq/L (ref 3.5–5.1)
Sodium: 139 mEq/L (ref 135–145)
Total Bilirubin: 0.9 mg/dL (ref 0.2–1.2)
Total Protein: 6.8 g/dL (ref 6.0–8.3)

## 2022-08-20 LAB — CBC WITH DIFFERENTIAL/PLATELET
Basophils Absolute: 0.1 10*3/uL (ref 0.0–0.1)
Basophils Relative: 0.8 % (ref 0.0–3.0)
Eosinophils Absolute: 0.2 10*3/uL (ref 0.0–0.7)
Eosinophils Relative: 3.1 % (ref 0.0–5.0)
HCT: 45.3 % (ref 36.0–46.0)
Hemoglobin: 15.2 g/dL — ABNORMAL HIGH (ref 12.0–15.0)
Lymphocytes Relative: 35 % (ref 12.0–46.0)
Lymphs Abs: 2.4 10*3/uL (ref 0.7–4.0)
MCHC: 33.5 g/dL (ref 30.0–36.0)
MCV: 88.2 fl (ref 78.0–100.0)
Monocytes Absolute: 0.5 10*3/uL (ref 0.1–1.0)
Monocytes Relative: 7.3 % (ref 3.0–12.0)
Neutro Abs: 3.6 10*3/uL (ref 1.4–7.7)
Neutrophils Relative %: 53.8 % (ref 43.0–77.0)
Platelets: 175 10*3/uL (ref 150.0–400.0)
RBC: 5.14 Mil/uL — ABNORMAL HIGH (ref 3.87–5.11)
RDW: 13.9 % (ref 11.5–15.5)
WBC: 6.7 10*3/uL (ref 4.0–10.5)

## 2022-08-20 LAB — MICROALBUMIN / CREATININE URINE RATIO
Creatinine,U: 72 mg/dL
Microalb Creat Ratio: 1.3 mg/g (ref 0.0–30.0)
Microalb, Ur: 0.9 mg/dL (ref 0.0–1.9)

## 2022-08-20 LAB — HEMOGLOBIN A1C: Hgb A1c MFr Bld: 7.1 % — ABNORMAL HIGH (ref 4.6–6.5)

## 2022-08-20 LAB — LIPID PANEL
Cholesterol: 272 mg/dL — ABNORMAL HIGH (ref 0–200)
HDL: 40.4 mg/dL (ref >39.00)
Total CHOL/HDL Ratio: 7
Triglycerides: 499 mg/dL — ABNORMAL HIGH (ref 0.0–149.0)

## 2022-08-20 LAB — LDL CHOLESTEROL, DIRECT: Direct LDL: 225 mg/dL

## 2022-08-20 MED ORDER — EZETIMIBE 10 MG PO TABS: 10.0000 mg | ORAL_TABLET | Freq: Every day | ORAL | 3 refills | Status: DC

## 2022-08-20 MED ORDER — RYBELSUS 3 MG PO TABS: 3.0000 mg | ORAL_TABLET | Freq: Every day | ORAL | 0 refills | Status: DC

## 2022-08-20 NOTE — Assessment & Plan Note (Addendum)
Chronic Regular exercise and healthy diet encouraged Check lipid panel today Statin intolerant-did not tolerate Crestor 5 mg 3 times weekly due to joint pain Trial of Zetia 10 mg daily Recheck lipids, hepatic function in 6-8 weeks

## 2022-08-20 NOTE — Assessment & Plan Note (Signed)
Chronic GERD controlled Continue pantoprazole 40 mg daily 

## 2022-08-20 NOTE — Assessment & Plan Note (Addendum)
Chronic  Lab Results  Component Value Date   HGBA1C 6.9 (H) 02/17/2022   Sugars controlled Check A1c, urine microalbumin Encouraged weight loss Stressed regular exercise, diabetic diet She is open to medication, especially would help her lose weight because she knows it would likely help her knee and her back pain Start Rybelsus 3 mg daily

## 2022-08-20 NOTE — Assessment & Plan Note (Signed)
Chronic Sees chiropractor Q 2 weeks which helps some Has pain getting out of bed is painful.  She denies pain while sleeping.  Some activities cause pain Would like to see ortho - [facility]

## 2022-08-20 NOTE — Assessment & Plan Note (Signed)
Chronic DEXA due next month Continue vitamin D, calcium supplementation Stressed regular exercise DEXA ordered

## 2022-08-20 NOTE — Progress Notes (Signed)
Subjective:   Ernest Orr is a 81 y.o. female who presents for Medicare Annual (Subsequent) preventive examination.  Review of Systems     Cardiac Risk Factors include: advanced age (>82mn, >>13women);hypertension;obesity (BMI >30kg/m2);dyslipidemia     Objective:    Today's Vitals   08/20/22 1006  BP: 120/78  Pulse: 65  Resp: 16  Temp: (!) 97.5 F (36.4 C)  TempSrc: Temporal  SpO2: 97%  Weight: 184 lb (83.5 kg)  Height: '5\' 2"'$  (1.575 m)  PainSc: 0-No pain   Body mass index is 33.65 kg/m.     08/20/2022   10:25 AM 07/29/2019   12:19 PM 05/25/2019    8:42 PM 05/25/2019    2:35 PM 05/24/2018   10:47 AM 05/19/2018   11:16 AM 05/03/2015   11:03 AM  Advanced Directives  Does Patient Have a Medical Advance Directive? Yes Yes Yes Yes Yes Yes No  Type of AParamedicof [place]Living will [place]Living will Healthcare Power of [place]Living will [place]Living will [place]Living will   Does patient want to make changes to medical advance directive?   No - Patient declined      Copy of [place]in Chart? No - copy requested No - copy requested No - copy requested      Would patient like information on creating a medical advance directive?       Yes - Educational materials given    Current Medications (verified) Outpatient Encounter Medications as of 08/20/2022  Medication Sig   Cholecalciferol (D3 HIGH POTENCY) 50 MCG (2000 UT) CAPS Take by mouth daily.   Loratadine (KS ALLERCLEAR PO) Take 1 tablet by mouth daily.   metoprolol tartrate (LOPRESSOR) 50 MG tablet TAKE 1 TABLET BY MOUTH TWICE A DAY   Multiple Vitamins-Minerals (AIRBORNE GUMMIES PO) Take 2 each by mouth daily.   Omega-3 Fatty Acids (FISH OIL) 1000 MG CAPS Take 1,000 mg by mouth daily. 2000 mg in the morning and 1000 mg at night   pantoprazole (PROTONIX) 40 MG tablet Take 1  tablet (40 mg total) by mouth daily. **PLEASE CALL THE OFFICE TO SCHEDULE APPOINTMENT   rosuvastatin (CRESTOR) 5 MG tablet Take 1 tablet (5 mg total) by mouth 3 (three) times a week.   No facility-administered encounter medications on file as of 08/20/2022.    Allergies (verified) Penicillins and Statins   History: Past Medical History:  Diagnosis Date   ALLERGIC RHINITIS    Allergy    Anemia    Angiectasia 06/30/2019   small- in the small bowel   Anxiety    Blood transfusion without reported diagnosis    Cataract    beginning stage in lt. 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   s/p dilation   Scoliosis    Tubular adenoma of colon    Past Surgical History:  Procedure Laterality Date   child birth  710& [id number]  COLONOSCOPY     ESOPHAGEAL DILATION N/A  Fibroidectomy     Low back surgery  01/1994   2 bulging disc   POLYPECTOMY     TONSILLECTOMY  1968   UPPER GASTROINTESTINAL ENDOSCOPY     Family History  Problem Relation Age of Onset   Arthritis Mother    Coronary artery disease Father    Hyperlipidemia Brother    Diabetes Maternal Aunt    Hyperlipidemia Cousin    Heart disease Cousin    Diabetes Paternal Aunt    Colon cancer Neg Hx    Colon polyps Neg Hx    Esophageal cancer Neg Hx    Stomach cancer Neg Hx    Rectal cancer Neg Hx    Social History   Socioeconomic History   Marital status: Widowed    Spouse name: Not on file   Number of children: 2   Years of education: Not on file   Highest education level: Not on file  Occupational History   Occupation: retired  Tobacco Use   Smoking status: Never    Smokeless tobacco: Never   Tobacco comments:    Married, 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  Substance and Sexual Activity   Alcohol use: No    Alcohol/week: 0.0 standard drinks of alcohol   Drug use: No   Sexual activity: Not Currently  Other Topics Concern   Not on file  Social History Narrative    2 children;   One in [place] and one is in [place]   Retired Pharmacist, hospital   Social Determinants of [place] Strain: [place]  (08/20/2022)   Overall Financial Resource Strain (CARDIA)    Difficulty of Paying Living Expenses: Not hard at all  Food Insecurity: No Food Insecurity (08/20/2022)   Hunger Vital Sign    Worried About Running Out of Food in the Last Year: Never true    Ran Out of Food in the Last Year: Never true  Transportation Needs: No Transportation Needs (08/20/2022)   PRAPARE - Hydrologist (Medical): No    Lack of Transportation (Non-Medical): No  Physical Activity: Sufficiently Active (08/20/2022)   Exercise Vital Sign    Days of Exercise per Week: 5 days    Minutes of Exercise per Session: 30 min  Recent Concern: Physical Activity - Inactive (08/20/2022)   Exercise Vital Sign    Days of Exercise per Week: 0 days    Minutes of Exercise per Session: 0 min  Stress: No Stress Concern Present (08/20/2022)   [place]    Feeling of Stress : Not at all  Social Connections: [place] (08/20/2022)   Social Connection and Isolation Panel [NHANES]    Frequency of Communication with Friends and Family: More than three times a week    Frequency of Social Gatherings with Friends and Family: More than three times a week    Attends Religious Services: More than 4 times per year    Active Member of [employer] or Organizations: Yes    Attends Archivist Meetings: 1 to 4 times per year    Marital Status: Married     Tobacco Counseling Counseling given: Not Answered Tobacco comments: Married, lives with spouse. Retired Curator   Clinical Intake:  Pre-visit preparation completed: Yes  Pain : No/denies pain Pain Score: 0-No pain     BMI - recorded: 33.65 Nutritional Status: BMI >  30  Obese Nutritional Risks: None Diabetes: No  How often do you need to have someone help you when you read instructions, pamphlets, or other written materials from your doctor or pharmacy?: 1 - Never What is the last grade level you completed in school?: HSG; Bachelor's Degree; Retired Statistician  Diabetic? no  Interpreter Needed?: No  Information entered by :: [employer], LPN.   Activities of Daily Living    08/20/2022   10:44 AM  In your present state of health, do you have any difficulty performing the following activities:  Hearing? 0  Vision? 0  Difficulty concentrating or making decisions? 0  Walking or climbing stairs? 0  Dressing or bathing? 0  Doing errands, shopping? 0  Preparing Food and eating ? N  Using the Toilet? N  In the past six months, have you accidently leaked urine? N  Do you have problems with loss of bowel control? N  Managing your Medications? N  Managing your Finances? N  Housekeeping or managing your Housekeeping? N    Patient Care Team: [name], MD as PCP - General (Internal Medicine) Wylene Simmer, MD as Consulting Physician (Orthopedic Surgery) Pyrtle, Lajuan Lines, MD as Consulting Physician (Gastroenterology) Monna Fam, MD as Consulting Physician (Ophthalmology)  Indicate any recent Medical Services you may have received from other than [facility] providers in the past year (date may be approximate).     Assessment:   This is a routine wellness examination for Anaissa.  Hearing/Vision screen Hearing Screening - Comments:: Some hearing difficulties; no hearing aids.   Vision Screening - Comments:: Wears rx glasses - up to date  with routine eye exams with [facility].   Dietary issues and exercise activities discussed: Current Exercise Habits: Home exercise routine, Type of exercise: walking, Time (Minutes): 30, Frequency (Times/Week): 5, Weekly Exercise (Minutes/Week): 150, Intensity: Moderate, Exercise limited by: orthopedic condition(s)   Goals Addressed             This Visit's Progress    My goal is to lose weight.  My ideal weight would be 155 pounds.        Depression Screen    08/20/2022   10:07 AM 02/17/2022   10:32 AM 02/13/2021    9:51 AM 07/29/2019   12:20 PM 01/26/2019   11:01 AM 05/19/2018   11:18 AM 12/16/2017   11:21 AM  PHQ 2/9 Scores  PHQ - 2 Score 0 0 0 1 0 1 0  PHQ- 9 Score   2   2     Fall Risk    08/20/2022   10:44 AM 02/17/2022   10:32 AM 02/13/2021    9:34 AM 02/02/2020   10:27 AM 07/29/2019   12:20 PM  Fall Risk   Falls in the past year? 0 0 0 1 0  Number falls in past yr: 0 0 0 1 0  Injury with Fall? 0 0 0 0 0  Risk for fall due to : No Fall Risks No Fall Risks No Fall Risks    Follow up Falls prevention discussed Falls evaluation completed Falls evaluation completed      FALL RISK PREVENTION PERTAINING TO THE HOME:  Any stairs in or around the home? No  If so, are there any without handrails? No  Home free of loose throw rugs in walkways, pet beds, electrical cords, etc? Yes  Adequate lighting in your home to reduce risk of falls? Yes   ASSISTIVE DEVICES UTILIZED TO PREVENT FALLS:  Life alert? No  Use of a cane, walker or w/c? No  Grab bars in the bathroom? Yes  Shower chair or bench in shower? Yes  Elevated toilet seat or a handicapped toilet? Yes   TIMED UP AND GO:  Was the test performed? Yes .  Length of time to ambulate 10 feet: 8 sec.   Gait steady and fast without use of assistive device  Cognitive Function:    05/03/2015   11:07 AM  MMSE - Mini Mental State Exam  Not completed: Unable to complete      Significant value         08/20/2022   10:10 AM  6CIT Screen  What Year? 0 points  What month? 0 points  What time? 0 points  Count back from 20 0 points  Months in reverse 0 points  Repeat phrase 0 points  Total Score 0 points    Immunizations Immunization History  Administered Date(s) Administered   Fluad Quad(high Dose 65+) 07/29/2019, 08/19/2021   Influenza Split 10/01/2012   Influenza Whole 10/01/2009   Influenza, High Dose Seasonal PF 08/31/2013, 10/20/2014, 10/17/2015, 11/12/2016, 12/16/2017   Influenza-Unspecified 10/04/2018   PFIZER(Purple Top)SARS-COV-2 Vaccination 01/07/2020, 02/01/2020   Pfizer Covid-19 Vaccine Bivalent Booster 34yr & up 11/28/2021   Pneumococcal Conjugate-13 08/16/2015   Pneumococcal Polysaccharide-23 05/13/2017   Td 10/01/2006   Zoster Recombinat (Shingrix) 01/12/2017, 08/20/2017   Zoster, Live 02/09/2008    TDAP status: Due, Education has been provided regarding the importance of this vaccine. Advised may receive this vaccine at local pharmacy or Health Dept. Aware to provide a copy of the vaccination record if obtained from local pharmacy or Health Dept. Verbalized acceptance and understanding.  Flu Vaccine status: Completed at today's visit  Pneumococcal vaccine status: Up to date  Covid-19 vaccine status: Completed vaccines  Qualifies for Shingles Vaccine? Yes   Zostavax completed Yes   Shingrix Completed?: Yes  Screening Tests Health Maintenance  Topic Date Due   FOOT EXAM  Never done   TETANUS/TDAP  10/01/2016   OPHTHALMOLOGY EXAM  07/23/2021   COVID-19 Vaccine (4 - Pfizer series) 03/29/2022   INFLUENZA VACCINE  07/01/2022   HEMOGLOBIN A1C  08/20/2022   Diabetic kidney evaluation - Urine ACR  09/23/2022   DEXA SCAN  09/26/2022   Diabetic kidney evaluation - GFR measurement  02/18/2023   Pneumonia Vaccine 81 Years old  Completed   Hepatitis C Screening  Completed   Zoster Vaccines- Shingrix  Completed   HPV VACCINES  Aged Out   COLONOSCOPY (Pts  [phone] coverage will need to be confirmed)  Discontinued    Health Maintenance  Health Maintenance Due  Topic Date Due   FOOT EXAM  Never done   TETANUS/TDAP  10/01/2016   OPHTHALMOLOGY EXAM  07/23/2021   COVID-19 Vaccine (4 - Pfizer series) 03/29/2022   INFLUENZA VACCINE  07/01/2022   HEMOGLOBIN A1C  08/20/2022    Colorectal cancer screening: No longer required.   Mammogram status: Completed 02/24/2022. Repeat every year  Bone Density status: Completed 09/26/2020. Results reflect: Bone density results: OSTEOPENIA. Repeat every 2-3 years.  Lung Cancer Screening: (Low Dose CT Chest recommended if Age 79-80ears, 30 pack-year currently smoking OR have quit w/in 15years.) does not qualify.   Lung Cancer Screening Referral: no  Additional Screening:  Hepatitis C Screening: does qualify; Completed 02/13/2021  Vision Screening: Recommended annual ophthalmology exams for early detection of glaucoma and other disorders of the eye. Is the patient up to date  with their annual eye exam?  Yes  Who is the provider or what is the name of the office in which the patient attends annual eye exams? [facility] If pt is not established with a provider, would they like to be referred to a provider to establish care? No .   Dental Screening: Recommended annual dental exams for proper oral hygiene  Community Resource Referral / Chronic Care Management: CRR required this visit?  No   CCM required this visit?  No      Plan:     I have personally reviewed and noted the following in the patient's chart:   Medical and social history Use of alcohol, tobacco or illicit drugs  Current medications and supplements including opioid prescriptions. Patient is not currently taking opioid prescriptions. Functional ability and status Nutritional status Physical activity Advanced directives List of other physicians Hospitalizations, surgeries, and ER visits in previous 12  months Vitals Screenings to include cognitive, depression, and falls Referrals and appointments  In addition, I have reviewed and discussed with patient certain preventive protocols, quality metrics, and best practice recommendations. A written personalized care plan for preventive services as well as general preventive health recommendations were provided to patient.     Sheral Flow, LPN   [id number]   Nurse Notes: N/A

## 2022-08-20 NOTE — Patient Instructions (Signed)
Virginia [name] , Thank you for taking time to come for your Medicare Wellness Visit. I appreciate your ongoing commitment to your health goals. Please review the following plan we discussed and let me know if I can assist you in the future.   Screening recommendations/referrals: Colonoscopy: Last done 01/21/2022; no longer recommended Mammogram: 02/24/2022; due every year Bone Density: 09/26/2020; due every 2-3 years Recommended yearly ophthalmology/optometry visit for glaucoma screening and checkup Recommended yearly dental visit for hygiene and checkup  Vaccinations: Influenza vaccine: 08/20/2022 Pneumococcal vaccine: 08/16/2015, 05/14/2015 Tdap vaccine: 10/01/2006; due every 10 years (not covered by Medicare as preventative but will cover as treatment for an injury) Shingles vaccine: 01/12/2017, 08/20/2017   Covid-19: 01/07/2020, 02/01/2020, 11/28/2020 Zoster vaccine: 02/09/2008 RSV vaccine: due; check with local pharmacy ([facility], [facility]) for vaccine  Advanced directives: Yes  Conditions/risks identified: Yes; To lose weight.  Next appointment: Follow up in one year for your annual wellness visit.   Preventive Care 80 Years and Older, Female Preventive care refers to lifestyle choices and visits with your health care provider that can promote health and wellness. What does preventive care include? A yearly physical exam. This is also called an annual well check. Dental exams once or twice a year. Routine eye exams. Ask your health care provider how often you should have your eyes checked. Personal lifestyle choices, including: Daily care of your teeth and gums. Regular physical activity. Eating a healthy diet. Avoiding tobacco and drug use. Limiting alcohol use. Practicing safe sex. Taking low-dose aspirin every day. Taking vitamin and mineral supplements as recommended by your health care provider. What happens during an annual well check? The services and screenings done by your  health care provider during your annual well check will depend on your age, overall health, lifestyle risk factors, and family history of disease. Counseling  Your health care provider may ask you questions about your: Alcohol use. Tobacco use. Drug use. Emotional well-being. Home and relationship well-being. Sexual activity. Eating habits. History of falls. Memory and ability to understand (cognition). Work and work Statistician. Reproductive health. Screening  You may have the following tests or measurements: Height, weight, and BMI. Blood pressure. Lipid and cholesterol levels. These may be checked every 5 years, or more frequently if you are over 6 years old. Skin check. Lung cancer screening. You may have this screening every year starting at age 14 if you have a 30-pack-year history of smoking and currently smoke or have quit within the past 15 years. Fecal occult blood test (FOBT) of the stool. You may have this test every year starting at age 40. Flexible sigmoidoscopy or colonoscopy. You may have a sigmoidoscopy every 5 years or a colonoscopy every 10 years starting at age 14. Hepatitis C blood test. Hepatitis B blood test. Sexually transmitted disease (STD) testing. Diabetes screening. This is done by checking your blood sugar (glucose) after you have not eaten for a while (fasting). You may have this done every 1-3 years. Bone density scan. This is done to screen for osteoporosis. You may have this done starting at age 12. Mammogram. This may be done every 1-2 years. Talk to your health care provider about how often you should have regular mammograms. Talk with your health care provider about your test results, treatment options, and if necessary, the need for more tests. Vaccines  Your health care provider may recommend certain vaccines, such as: Influenza vaccine. This is recommended every year. Tetanus, diphtheria, and acellular pertussis (Tdap, Td) vaccine. You may  need a Td booster every 10 years. Zoster vaccine. You may need this after age 42. Pneumococcal 13-valent conjugate (PCV13) vaccine. One dose is recommended after age 10. Pneumococcal polysaccharide (PPSV23) vaccine. One dose is recommended after age 99. Talk to your health care provider about which screenings and vaccines you need and how often you need them. This information is not intended to replace advice given to you by your health care provider. Make sure you discuss any questions you have with your health care provider. Document Released: 12/14/2015 Document Revised: 08/06/2016 Document Reviewed: 09/18/2015 Elsevier Interactive Patient Education  2017 [place] Prevention in the Home Falls can cause injuries. They can happen to people of all ages. There are many things you can do to make your home safe and to help prevent falls. What can I do on the outside of my home? Regularly fix the edges of walkways and driveways and fix any cracks. Remove anything that might make you trip as you walk through a door, such as a raised step or threshold. Trim any bushes or trees on the path to your home. Use bright outdoor lighting. Clear any walking paths of anything that might make someone trip, such as rocks or tools. Regularly check to see if handrails are loose or broken. Make sure that both sides of any steps have handrails. Any raised decks and porches should have guardrails on the edges. Have any leaves, snow, or ice cleared regularly. Use sand or salt on walking paths during winter. Clean up any spills in your garage right away. This includes oil or grease spills. What can I do in the bathroom? Use night lights. Install grab bars by the toilet and in the tub and shower. Do not use towel bars as grab bars. Use non-skid mats or decals in the tub or shower. If you need to sit down in the shower, use a plastic, non-slip stool. Keep the floor dry. Clean up any water that spills on  the floor as soon as it happens. Remove soap buildup in the tub or shower regularly. Attach bath mats securely with double-sided non-slip rug tape. Do not have throw rugs and other things on the floor that can make you trip. What can I do in the bedroom? Use night lights. Make sure that you have a light by your bed that is easy to reach. Do not use any sheets or blankets that are too big for your bed. They should not hang down onto the floor. Have a firm chair that has side arms. You can use this for support while you get dressed. Do not have throw rugs and other things on the floor that can make you trip. What can I do in the kitchen? Clean up any spills right away. Avoid walking on wet floors. Keep items that you use a lot in easy-to-reach places. If you need to reach something above you, use a strong step stool that has a grab bar. Keep electrical cords out of the way. Do not use floor polish or wax that makes floors slippery. If you must use wax, use non-skid floor wax. Do not have throw rugs and other things on the floor that can make you trip. What can I do with my stairs? Do not leave any items on the stairs. Make sure that there are handrails on both sides of the stairs and use them. Fix handrails that are broken or loose. Make sure that handrails are as long as the stairways. Check  any carpeting to make sure that it is firmly attached to the stairs. Fix any carpet that is loose or worn. Avoid having throw rugs at the top or bottom of the stairs. If you do have throw rugs, attach them to the floor with carpet tape. Make sure that you have a light switch at the top of the stairs and the bottom of the stairs. If you do not have them, ask someone to add them for you. What else can I do to help prevent falls? Wear shoes that: Do not have high heels. Have rubber bottoms. Are comfortable and fit you well. Are closed at the toe. Do not wear sandals. If you use a stepladder: Make sure  that it is fully opened. Do not climb a closed stepladder. Make sure that both sides of the stepladder are locked into place. Ask someone to hold it for you, if possible. Clearly mark and make sure that you can see: Any grab bars or handrails. First and last steps. Where the edge of each step is. Use tools that help you move around (mobility aids) if they are needed. These include: Canes. Walkers. Scooters. Crutches. Turn on the lights when you go into a dark area. Replace any light bulbs as soon as they burn out. Set up your furniture so you have a clear path. Avoid moving your furniture around. If any of your floors are uneven, fix them. If there are any pets around you, be aware of where they are. Review your medicines with your doctor. Some medicines can make you feel dizzy. This can increase your chance of falling. Ask your doctor what other things that you can do to help prevent falls. This information is not intended to replace advice given to you by your health care provider. Make sure you discuss any questions you have with your health care provider. Document Released: 09/13/2009 Document Revised: 04/24/2016 Document Reviewed: 12/22/2014 Elsevier Interactive Patient Education  2017 [employer].

## 2022-08-20 NOTE — Assessment & Plan Note (Signed)
Chronic Blood pressure well controlled CMP Continue Metoprolol 50 mg twice daily

## 2022-08-29 ENCOUNTER — Telehealth: Payer: Self-pay | Admitting: Internal Medicine

## 2022-08-29 NOTE — Telephone Encounter (Signed)
Inbound call from patient regarding "Pantropazole" 40 mg, she want to know if she continues to taking it. Please advise

## 2022-08-31 DIAGNOSIS — E119 Type 2 diabetes mellitus without complications: Secondary | ICD-10-CM

## 2022-08-31 HISTORY — DX: Type 2 diabetes mellitus without complications: E11.9

## 2022-09-01 NOTE — Telephone Encounter (Signed)
Attempted to return call to [phone] number and phone rings then disconnects, it did this X2. Called home number and left message for pt to call back.

## 2022-09-02 NOTE — Telephone Encounter (Unsigned)
Left message for pt to call back.   09/03/22 Unable to reach pt. Will await further communication from pt.

## 2022-09-03 DIAGNOSIS — M9903 Segmental and somatic dysfunction of lumbar region: Secondary | ICD-10-CM | POA: Diagnosis not present

## 2022-09-03 DIAGNOSIS — M5441 Lumbago with sciatica, right side: Secondary | ICD-10-CM | POA: Diagnosis not present

## 2022-09-11 ENCOUNTER — Other Ambulatory Visit: Payer: Self-pay | Admitting: Internal Medicine

## 2022-09-17 DIAGNOSIS — M5441 Lumbago with sciatica, right side: Secondary | ICD-10-CM | POA: Diagnosis not present

## 2022-09-17 DIAGNOSIS — M9903 Segmental and somatic dysfunction of lumbar region: Secondary | ICD-10-CM | POA: Diagnosis not present

## 2022-09-19 NOTE — Progress Notes (Signed)
09/22/2022 Gery Pray [id number] [birth year]  Referring provider: [name], MD Primary GI doctor: Dr. Hilarie Fredrickson  ASSESSMENT AND PLAN:   Gastroesophageal reflux disease with history of hiatal hernia  EGD 06/2019 Has PPI at home, suggest taking every other day with history of hiatal hernia/cameron lesions.  Lifestyle changes discussed, avoid NSAIDS, ETOH Discussed GLP1 with the patient, mechanism of action and how this can worsen and/or cause nausea/GERD by causing gastroparesis.  Can start medication but monitor closely. Follow up 6 months  RLQ abdominal pain Resolved, no pain at this time.   Personal history of colonic polyps 01/21/2022 colonoscopy Dr. Hilarie Fredrickson for history of adenomatous polyps, good bowel prep, 3 mm TA polyp cecum, 4 mm TA polyp in ascending colon, 8 mm TA polyp descending, 3 mm hyperplastic polyp sigmoid, diverticulosis and small internal hemorrhoids.  No recall secondary to age.  History of Present Illness:  81 y.o. female  with a past medical history of anxiety, CKD, chol, osteopenia, history of GERD, tubular adenoma, diverticulosis and others listed below, returns to clinic today for evaluation of gerd.  05/2018 colonoscopy due to history of adenomatous polyps with villous component had 3 polyps largest 5 mm tubular adenoma no high-grade dysplasia.  Diverticula and small internal hemorrhoids. 06/24/2019 upper endo for IDA medium sized hiatal hernia, gastritis, gastric polyps 06/30/2019 capsule endo cratered ulcers small bowel 09/24/2021 office visit with Nicoletta Ba for mid/right lower quadrant abdominal discomfort proceeded with CT and colon if unrevealing.  But patient never proceeded with CT abdomen pelvis with contrast. 01/21/2022 colonoscopy Dr. Hilarie Fredrickson for history of adenomatous polyps, good bowel prep, 3 mm TA polyp cecum, 4 mm TA polyp in ascending colon, 8 mm TA polyp descending, 3 mm hyperplastic polyp sigmoid, diverticulosis and small internal  hemorrhoids.  No recall secondary to age.  She is on pantoprazole for GERD, has not been taking it for 3 months and has had 1 episode of GERD. She states she has had some intermittent trouble swallowing.  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  She has rybelsus on her med list but has not started it, wanted to come in and discuss before starting. She did start on zetia and has had some loose stools since starting.  No AB pain, no melena, no hematochezia.    She  reports that she has never smoked. She has never used smokeless tobacco. She reports that she does not drink alcohol and does not use drugs. Her family history includes Arthritis in her mother; Coronary artery disease in her father; Diabetes in her maternal aunt and paternal aunt; Heart disease in her cousin; Hyperlipidemia in her brother and cousin.   Current Medications:   Current Outpatient Medications (Endocrine & Metabolic):    RYBELSUS 3 MG TABS, TAKE 1 TABLET(3 MG) BY MOUTH DAILY. (Patient not taking: Reported on 09/22/2022)  Current Outpatient Medications (Cardiovascular):    ezetimibe (ZETIA) 10 MG tablet, Take 1 tablet (10 mg total) by mouth daily.   metoprolol tartrate (LOPRESSOR) 50 MG tablet, TAKE 1 TABLET BY MOUTH TWICE A DAY  Current Outpatient Medications (Respiratory):    Loratadine (KS ALLERCLEAR PO), Take 1 tablet by mouth daily.    Current Outpatient Medications (Other):    Cholecalciferol (D3 HIGH POTENCY) 50 MCG (2000 UT) CAPS, Take by mouth daily.   Multiple Vitamins-Minerals (AIRBORNE GUMMIES PO), Take 2 each by mouth daily.   Omega-3 Fatty Acids (FISH OIL)  1000 MG CAPS, Take 1,000 mg by mouth daily. 2000 mg in the morning and 1000 mg at night   pantoprazole (PROTONIX) 40 MG tablet, Take 1 tablet (40 mg total) by mouth daily. **PLEASE CALL THE OFFICE TO SCHEDULE APPOINTMENT  Surgical History:  She  has a past  surgical history that includes child birth (11 & 50); Low back surgery (01/1994); Tonsillectomy (1968); Fibroidectomy; Colonoscopy; Polypectomy; Upper gastrointestinal endoscopy; and Esophageal dilation (N/A).  Current Medications, Allergies, Past Medical History, Past Surgical History, Family History and Social History were reviewed in [employer] record.  Physical Exam: BP (!) 140/78   Pulse 65   Ht '5\' 3"'$  (1.6 m)   Wt 183 lb 9.6 oz (83.3 kg)   SpO2 99%   BMI 32.52 kg/m  General:   Pleasant, well developed female in no acute distress Heart : Regular rate and rhythm; no murmurs Pulm: Clear anteriorly; no wheezing Abdomen:  Soft, Obese AB, Active bowel sounds. No tenderness . , No organomegaly appreciated. Rectal: Not evaluated Extremities:  without  edema. Neurologic:  Alert and  oriented x4;  No focal deficits.  Psych:  Cooperative. Normal mood and affect.   Vladimir Crofts, PA-C 09/22/22

## 2022-09-22 ENCOUNTER — Ambulatory Visit: Payer: Medicare PPO | Admitting: Physician Assistant

## 2022-09-22 ENCOUNTER — Encounter: Payer: Self-pay | Admitting: Physician Assistant

## 2022-09-22 VITALS — BP 140/78 | HR 65 | Ht 63.0 in | Wt 183.6 lb

## 2022-09-22 DIAGNOSIS — Z86010 Personal history of colonic polyps: Secondary | ICD-10-CM

## 2022-09-22 DIAGNOSIS — K2100 Gastro-esophageal reflux disease with esophagitis, without bleeding: Secondary | ICD-10-CM

## 2022-09-22 DIAGNOSIS — R1031 Right lower quadrant pain: Secondary | ICD-10-CM | POA: Diagnosis not present

## 2022-09-22 NOTE — Patient Instructions (Addendum)
Please take your proton pump inhibitor medication, can take every other day or as needed  Did have hiatal hernia, suggest taking at least every other day Please take this medication 30 minutes to 1 hour before meals- this makes it more effective.  Avoid spicy and acidic foods Avoid fatty foods Limit your intake of coffee, tea, alcohol, and carbonated drinks Work to maintain a healthy weight Keep the head of the bed elevated at least 3 inches with blocks or a wedge pillow if you are having any nighttime symptoms Stay upright for 2 hours after eating Avoid meals and snacks three to four hours before bedtime  Gastroparesis Please do small frequent meals like 4-6 meals a day.  Eat and drink liquids at separate times.  Avoid high fiber foods, cook your vegetables, avoid high fat food.  Suggest spreading protein throughout the day (greek yogurt, glucerna, soft meat, milk, eggs) Choose soft foods that you can mash with a fork When you are more symptomatic, change to pureed foods foods and liquids.  Consider reading "Living well with Gastroparesis" by Lambert Keto Gastroparesis is a condition in which food takes longer than normal to empty from the stomach. This condition is also known as delayed gastric emptying. It is usually a long-term (chronic) condition. RYBELSUS AND other medications like this (ozempic, GLP-1 inhibitors) cause a form of gastroparesis)  If you get severe nausea, constipation, AB pain, cut back on the dose or stop the medication.   First do a trial off milk/lactose products if you use them.  Add fiber like benefiber or citracel once a day Increase activity Can do trial of IBGard for any AB pain which is over the counter for AB pain- Take 1-2 capsules once a day for maintence or twice a day during a flare   Please try low FODMAP diet- see below- start with eliminating just one column at a time, the table at the very bottom contains foods that are safe to take    FODMAP stands for fermentable oligo-, di-, mono-saccharides and polyols (1). These are the scientific terms used to classify groups of carbs that are notorious for triggering digestive symptoms like bloating, gas and stomach pain.      Please follow up in 1 year.   Thank you for entrusting me with your care and for choosing [facility], Vicie Mutters, P.A.-C

## 2022-09-26 ENCOUNTER — Ambulatory Visit: Payer: Medicare PPO | Admitting: Internal Medicine

## 2022-09-26 ENCOUNTER — Ambulatory Visit (INDEPENDENT_AMBULATORY_CARE_PROVIDER_SITE_OTHER): Payer: Medicare PPO

## 2022-09-26 VITALS — BP 132/64 | HR 59 | Temp 97.7°F | Ht 63.0 in | Wt 182.0 lb

## 2022-09-26 DIAGNOSIS — E119 Type 2 diabetes mellitus without complications: Secondary | ICD-10-CM

## 2022-09-26 DIAGNOSIS — M25562 Pain in left knee: Secondary | ICD-10-CM | POA: Diagnosis not present

## 2022-09-26 DIAGNOSIS — M79662 Pain in left lower leg: Secondary | ICD-10-CM

## 2022-09-26 DIAGNOSIS — M25462 Effusion, left knee: Secondary | ICD-10-CM

## 2022-09-26 DIAGNOSIS — I10 Essential (primary) hypertension: Secondary | ICD-10-CM

## 2022-09-26 DIAGNOSIS — M7989 Other specified soft tissue disorders: Secondary | ICD-10-CM | POA: Diagnosis not present

## 2022-09-26 DIAGNOSIS — R600 Localized edema: Secondary | ICD-10-CM | POA: Diagnosis not present

## 2022-09-26 MED ORDER — MELOXICAM 7.5 MG PO TABS: 7.5000 mg | ORAL_TABLET | Freq: Every day | ORAL | 2 refills | Status: DC | PRN

## 2022-09-26 NOTE — Progress Notes (Unsigned)
Patient ID: Virginia [name], female   DOB: 10-Jul-1941, 81 y.o.   MRN: [id number]        Chief Complaint: follow up left knee pain swelling and left lower leg pain and swelling, htn, dm       HPI:  Virginia [name] is a 81 y.o. female here with c/o 1 wk onset worsening whole leg pain, that seemed to begin at the anterior upper distal just above the knee, with also left knee pain and swelling, and also worsening more distal LLE pain and swelling.  No trauma or falls.  Pt denies chest pain, increased sob or doe, wheezing, orthopnea, PND, increased LE swelling, palpitations, dizziness or syncope.   Pt denies polydipsia, polyuria, or new focal neuro s/s.          Wt Readings from Last 3 Encounters:  09/26/22 182 lb (82.6 kg)  09/22/22 183 lb 9.6 oz (83.3 kg)  08/20/22 184 lb (83.5 kg)   BP Readings from Last 3 Encounters:  09/26/22 132/64  09/22/22 (!) 140/78  08/20/22 120/78         Past Medical History:  Diagnosis Date   ALLERGIC RHINITIS    Allergy    Anemia    Angiectasia 06/30/2019   small- in the small bowel   Anxiety    Blood transfusion without reported diagnosis    Cataract    beginning stage in lt. 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 She  has never used smokeless tobacco. She reports that she does not drink alcohol and does not use drugs. family history includes Arthritis in her mother; Coronary artery disease in her father; Diabetes in her maternal aunt and paternal aunt; Heart disease in her cousin; Hyperlipidemia in her brother and cousin. Allergies  Allergen Reactions   Penicillins Hives    Did it involve swelling of the face/tongue/throat, SOB, or low BP? No Did it involve sudden or severe rash/hives, skin peeling, or any reaction on the inside of your mouth or nose? No Did you need to seek medical attention at a hospital or doctor's office? No When did it last happen? many years ago      If all above answers are "NO", may proceed with cephalosporin use.    Statins Other (See Comments)    pain in base of thumbs   Current Outpatient Medications on File Prior to Visit  Medication Sig Dispense Refill   Cholecalciferol (D3 HIGH POTENCY) 50 MCG (2000 UT) CAPS Take by mouth daily.     ezetimibe (ZETIA) 10 MG tablet  Take 1 tablet (10 mg total) by mouth daily. 90 tablet 3   Loratadine (KS ALLERCLEAR PO) Take 1 tablet by mouth daily.     metoprolol tartrate (LOPRESSOR) 50 MG tablet TAKE 1 TABLET BY MOUTH TWICE A DAY 180 tablet 1   Multiple Vitamins-Minerals (AIRBORNE GUMMIES PO) Take 2 each by mouth daily.     Omega-3 Fatty Acids (FISH OIL) 1000 MG CAPS Take 1,000 mg by mouth daily. 2000 mg in the morning and 1000 mg at night     pantoprazole (PROTONIX) 40 MG tablet Take 1 tablet (40 mg total) by mouth daily. **PLEASE CALL THE OFFICE TO SCHEDULE APPOINTMENT 90 tablet 3   RYBELSUS 3 MG TABS TAKE 1 TABLET(3 MG) BY MOUTH DAILY. (Patient not taking: Reported on 09/26/2022) 30 tablet 0   No current facility-administered medications on file prior to visit.        ROS:  All others reviewed and negative.  Objective        PE:  BP 132/64 (BP Location: Left Arm, Patient Position: Sitting, Cuff Size: Large)   Pulse (!) 59   Temp  97.7 F (36.5 C) (Oral)   Ht '5\' 3"'$  (1.6 m)   Wt 182 lb (82.6 kg)   SpO2 98%   BMI 32.24 kg/m                 Constitutional: Pt appears in NAD               HENT: Head: NCAT.                Right Ear: External ear normal.                 Left Ear: External ear normal.                Eyes: . Pupils are equal, round, and reactive to light. Conjunctivae and EOM are normal               Nose: without d/c or deformity               Neck: Neck supple. Gross normal ROM               Cardiovascular: Normal rate and regular rhythm.                 Pulmonary/Chest: Effort normal and breath sounds without rales or wheezing.                Abd:  Soft, NT, ND, + BS, no organomegaly               Neurological: Pt is alert. At baseline orientation, motor grossly intact nut left knee with 2+ mild tender effusion               Skin:  LE edema - 2+ distal LLe with diffuse tender, no skin changes               Psychiatric: Pt behavior is normal without agitation   Micro: none  Cardiac tracings I have personally interpreted today:  none  Pertinent Radiological findings (summarize): none   Lab Results  Component Value Date   WBC 6.7 08/20/2022   HGB 15.2 (H) 08/20/2022   HCT 45.3 08/20/2022   PLT 175.0 08/20/2022   GLUCOSE 102 (H) 08/20/2022   CHOL 272 (H) 08/20/2022   TRIG (H) 08/20/2022    499.0 Triglyceride is over 400; calculations on Lipids are invalid.  HDL 40.40 08/20/2022   LDLDIRECT 225.0 08/20/2022   LDLCALC 123 (H) 08/01/2020   ALT 21 08/20/2022   AST 16 08/20/2022   NA 139 08/20/2022   K 4.5 08/20/2022   CL 102 08/20/2022   CREATININE 0.68 08/20/2022   BUN 18 08/20/2022   CO2 30 08/20/2022   TSH 3.05 09/23/2021   INR 1.0 05/25/2019   HGBA1C 7.1 (H) 08/20/2022   MICROALBUR 0.9 08/20/2022   Assessment/Plan:  Virginia [name] is a 81 y.o. White or Caucasian [1] female with  has a past medical history of ALLERGIC RHINITIS, Allergy, Anemia, Angiectasia (06/30/2019),  Anxiety, Blood transfusion without reported diagnosis, Cataract, Chronic kidney disease, Diabetes ([place]) (08/2022), Diverticulosis, FIBROIDS, UTERUS, GERD, HYPERLIPIDEMIA, HYPERTENSION, Intestinal ulcer (06/30/2019), OSTEOARTHRITIS, Osteopenia (06/21/2014), SCHATZKI'S RING (11/1999, 08/2009), Scoliosis, and Tubular adenoma of colon.  Diabetes mellitus without complication ([place]) Lab Results  Component Value Date   HGBA1C 7.1 (H) 08/20/2022   Stable, pt to continue current medical treatment rybelsus 3 mg qd   Essential hypertension BP Readings from Last 3 Encounters:  09/26/22 132/64  09/22/22 (!) 140/78  08/20/22 120/78   Stable, pt to continue medical treatment lopressor 50 mg bid   Pain and swelling of left lower leg Could be sympathetic swelling related to knee, but cant r/o dvt - for stat venous doppler LLE  Pain and swelling of left knee I suspect this is main issue, could be DJD flare vs other, doubt gout, no fever, now for mobic 7.5 mg prn, refer sport medicine  Followup: Return if symptoms worsen or fail to improve.  Cathlean Cower, MD 09/27/2022 3:30 PM [facility]

## 2022-09-26 NOTE — Patient Instructions (Addendum)
Please take all new medication as prescribed  - the anti-inflammatory for pain and swelling  Please continue all other medications as before, and refills have been done if requested.  Please have the pharmacy call with any other refills you may need.  Please keep your appointments with your specialists as you may have planned  You will be contacted regarding the referral for: Leg vein ultrasound to check for blood clot asap, as well as Sports Medicine for the knee

## 2022-09-27 ENCOUNTER — Encounter: Payer: Self-pay | Admitting: Internal Medicine

## 2022-09-27 DIAGNOSIS — M25562 Pain in left knee: Secondary | ICD-10-CM | POA: Insufficient documentation

## 2022-09-27 DIAGNOSIS — M25462 Effusion, left knee: Secondary | ICD-10-CM | POA: Insufficient documentation

## 2022-09-27 DIAGNOSIS — M7989 Other specified soft tissue disorders: Secondary | ICD-10-CM | POA: Insufficient documentation

## 2022-09-27 DIAGNOSIS — M79662 Pain in left lower leg: Secondary | ICD-10-CM | POA: Insufficient documentation

## 2022-09-27 NOTE — Assessment & Plan Note (Signed)
I suspect this is main issue, could be DJD flare vs other, doubt gout, no fever, now for mobic 7.5 mg prn, refer sport medicine

## 2022-09-27 NOTE — Progress Notes (Signed)
Addendum: Reviewed and agree with assessment and management plan. Kaho Selle M, MD  

## 2022-09-27 NOTE — Assessment & Plan Note (Signed)
Could be sympathetic swelling related to knee, but cant r/o dvt - for stat venous doppler LLE

## 2022-09-27 NOTE — Assessment & Plan Note (Signed)
Lab Results  Component Value Date   HGBA1C 7.1 (H) 08/20/2022   Stable, pt to continue current medical treatment rybelsus 3 mg qd

## 2022-09-27 NOTE — Assessment & Plan Note (Signed)
BP Readings from Last 3 Encounters:  09/26/22 132/64  09/22/22 (!) 140/78  08/20/22 120/78   Stable, pt to continue medical treatment lopressor 50 mg bid

## 2022-09-30 ENCOUNTER — Ambulatory Visit (HOSPITAL_COMMUNITY)
Admission: RE | Admit: 2022-09-30 | Discharge: 2022-09-30 | Disposition: A | Discharge status: Home / Self Care | Payer: Medicare PPO | Source: Ambulatory Visit | Attending: Internal Medicine | Admitting: Internal Medicine

## 2022-09-30 ENCOUNTER — Telehealth: Payer: Self-pay

## 2022-09-30 DIAGNOSIS — M79662 Pain in left lower leg: Secondary | ICD-10-CM | POA: Diagnosis not present

## 2022-09-30 DIAGNOSIS — M7989 Other specified soft tissue disorders: Secondary | ICD-10-CM | POA: Diagnosis not present

## 2022-09-30 NOTE — Telephone Encounter (Signed)
Virginia [name] with vascular and vein called to report that pt tested NEGATIVE for DVT in her left leg. Dr Jenny Reichmann ordered this on 09/26/22 but he is not in office.

## 2022-09-30 NOTE — Telephone Encounter (Signed)
This is what I found with the xray -   The test results show that your current treatment is OK, as the xray is significant for mild arthritis, and also swelling in the knee itself.  Please continue the plan for referral to Sports Medicine, and the mobic as needed for pain.  There is no other need for change of treatment or further evaluation based on these results, at this time.  thanks

## 2022-09-30 NOTE — Telephone Encounter (Signed)
Please call and let her know it was neg for dvt

## 2022-10-01 NOTE — Telephone Encounter (Signed)
Results given today

## 2022-10-02 ENCOUNTER — Ambulatory Visit: Payer: Medicare PPO | Admitting: Family Medicine

## 2022-10-02 NOTE — Progress Notes (Deleted)
I, Peterson Lombard, LAT, ATC acting as a scribe for Lynne Leader, MD.  Subjective:    CC: Left knee and lower leg pain  HPI: Patient is a 81 year old female complaining of left knee and lower leg pain ongoing since mid October.  Patient was seen by her PCP on 09/26/2022 who ordered a vascular ultrasound.  Today, patient reports?  Patient locates pain to?  Left knee swelling: Mechanical symptoms: Radiates: Aggravates: Treatments tried:  Dx testing: 09/30/2022 LE left vascular ultrasound  09/26/2022 left knee x-ray  Pertinent review of Systems: ***  Relevant historical information: ***   Objective:   There were no vitals filed for this visit. General: Well Developed, well nourished, and in no acute distress.   MSK: ***  Lab and Radiology Results No results found for this or any previous visit (from the past 72 hour(s)). VAS [place] LOWER EXTREMITY VENOUS (DVT)  Result Date: 09/30/2022  Lower Venous DVT Study Patient Name:  Virginia [name]  Date of Exam:   09/30/2022 Medical Rec #: [id number]           Accession #:    [id number] Date of Birth: [birth year]            Patient Gender: F Patient Age:   81 years Exam Location:  [facility] Procedure:      VAS [place] LOWER EXTREMITY VENOUS (DVT) Referring Phys: Cathlean Cower --------------------------------------------------------------------------------  Indications: Left lower extremity edema x 7 days.  Comparison Study: No prior exam Performing Technologist: Alvia Grove RVT  Examination Guidelines: A complete evaluation includes B-mode imaging, spectral Doppler, color Doppler, and power Doppler as needed of all accessible portions of each vessel. Bilateral testing is considered an integral part of a complete examination. Limited examinations for reoccurring indications may be performed as noted. The reflux portion of the exam is performed with the patient in reverse Trendelenburg.   +-----+---------------+---------+-----------+----------+--------------+ RIGHTCompressibilityPhasicitySpontaneityPropertiesThrombus Aging +-----+---------------+---------+-----------+----------+--------------+ CFV  Full           Yes      Yes                                 +-----+---------------+---------+-----------+----------+--------------+   +---------+---------------+---------+-----------+----------+--------------+ LEFT     CompressibilityPhasicitySpontaneityPropertiesThrombus Aging +---------+---------------+---------+-----------+----------+--------------+ CFV      Full           Yes      Yes                                 +---------+---------------+---------+-----------+----------+--------------+ SFJ      Full           Yes      Yes                                 +---------+---------------+---------+-----------+----------+--------------+ FV Prox  Full           Yes      Yes                                 +---------+---------------+---------+-----------+----------+--------------+ FV Mid   Full           Yes      Yes                                 +---------+---------------+---------+-----------+----------+--------------+  FV DistalFull           Yes      Yes                                 +---------+---------------+---------+-----------+----------+--------------+ PFV      Full           Yes      Yes                                 +---------+---------------+---------+-----------+----------+--------------+ POP      Full           Yes      Yes                                 +---------+---------------+---------+-----------+----------+--------------+ PTV      Full           Yes      Yes                                 +---------+---------------+---------+-----------+----------+--------------+ PERO     Full           Yes      Yes                                  +---------+---------------+---------+-----------+----------+--------------+ Gastroc  Full           Yes      Yes                                 +---------+---------------+---------+-----------+----------+--------------+ GSV      Full           Yes      Yes                                 +---------+---------------+---------+-----------+----------+--------------+ SSV      Full           Yes      Yes                                 +---------+---------------+---------+-----------+----------+--------------+   Findings reported to [facility] at 2:36.  Summary: LEFT: - There is no evidence of deep vein thrombosis in the lower extremity. - There is no evidence of superficial venous thrombosis.  *See table(s) above for measurements and observations. Electronically signed by Monica Martinez MD on 09/30/2022 at 4:44:37 PM.    Final       Impression and Recommendations:    Assessment and Plan: 81 y.o. female with ***.  PDMP not reviewed this encounter. No orders of the defined types were placed in this encounter.  No orders of the defined types were placed in this encounter.   Discussed warning signs or symptoms. Please see discharge instructions. Patient expresses understanding.   ***

## 2022-10-06 ENCOUNTER — Ambulatory Visit: Payer: Self-pay

## 2022-10-06 ENCOUNTER — Ambulatory Visit (INDEPENDENT_AMBULATORY_CARE_PROVIDER_SITE_OTHER): Payer: Medicare PPO | Admitting: Family Medicine

## 2022-10-06 VITALS — BP 156/88 | HR 65 | Ht 63.0 in | Wt 182.0 lb

## 2022-10-06 DIAGNOSIS — M25562 Pain in left knee: Secondary | ICD-10-CM | POA: Diagnosis not present

## 2022-10-06 DIAGNOSIS — M25462 Effusion, left knee: Secondary | ICD-10-CM

## 2022-10-06 NOTE — Progress Notes (Signed)
I, Peterson Lombard, LAT, ATC acting as a scribe for Lynne Leader, MD.  Subjective:    CC: Left knee and lower leg pain  HPI: Patient is a 81 year old female complaining of left knee and lower leg pain ongoing since mid October.  Patient was seen by her PCP on 09/26/2022 who ordered a vascular ultrasound.  Today, patient reports no falls or trauma to the L knee. She does recall doing a lot of stairs and helped a friend move around the time the L knee pain started. Patient locates pain to the anterior aspect. She's been prescribed meloxicam, but was concerned about the side effects and has not yet started taking it.   Left knee swelling: yes- has improved some Mechanical symptoms: yes Aggravates: standing Treatments tried: ice, heat, CBD cream, Tylenol  Dx testing: 09/30/2022 LE left vascular ultrasound  09/26/2022 left knee x-ray  Pertinent review of Systems: No fevers or chills  Relevant historical information: Hypertension.  Diabetes.   Objective:    Vitals:   10/06/22 1329  BP: (!) 156/88  Pulse: 65  SpO2: 97%   General: Well Developed, well nourished, and in no acute distress.   MSK: Left knee: Mild effusion.  Otherwise normal-appearing. Normal motion with crepitation.  Tender palpation medial joint line. Stable ligamentous exam. Intact strength.  Lab and Radiology Results  Procedure: Real-time Ultrasound Guided Injection of left knee superior lateral patellar space Device: Philips Affiniti 50G Images permanently stored and available for review in PACS Ultrasound evaluation prior to injection reveals moderate joint effusion.  Degenerative partially extruded medial meniscus also present. Verbal informed consent obtained.  Discussed risks and benefits of procedure. Warned about infection, bleeding, hyperglycemia damage to structures among others. Patient expresses understanding and agreement Time-out conducted.   Noted no overlying erythema, induration, or other  signs of local infection.   Skin prepped in a sterile fashion.   Local anesthesia: Topical Ethyl chloride.   With sterile technique and under real time ultrasound guidance: 40 mg of Kenalog and 2 mm of Marcaine injected into knee joint. Fluid seen entering the joint capsule.   Completed without difficulty   Pain immediately resolved suggesting accurate placement of the medication.   Advised to call if fevers/chills, erythema, induration, drainage, or persistent bleeding.   Images permanently stored and available for review in the ultrasound unit.  Impression: Technically successful ultrasound guided injection.  EXAM: LEFT KNEE - COMPLETE 4+ VIEW   COMPARISON:  None Available.   FINDINGS: Subjective bony under mineralization. Slight medial tibiofemoral joint space narrowing. Mild medial tibiofemoral and patellofemoral spurring. There is a small knee joint effusion. No fracture. No erosion, bony destructive change or periostitis. Mild soft tissue edema.   IMPRESSION: Mild osteoarthritis of the left knee with small joint effusion.     Electronically Signed   By: Keith Rake M.D.   On: 09/30/2022 13:39   I, Lynne Leader, personally (independently) visualized and performed the interpretation of the images attached in this note.     Impression and Recommendations:    Assessment and Plan: 81 y.o. female with left knee pain thought to be due to exacerbation of DJD.  She has increased her activity level recently which may explain her knee pain.  Plan for steroid injection today.  Also recommend Tylenol arthritis and Voltaren gel.  Additionally refer to physical therapy.  Check back in 8 weeks.. Work on authorization now for hyaluronic acid injections which we can perform if needed.  PDMP not reviewed this encounter.  Orders Placed This Encounter  Procedures   [place] LIMITED JOINT SPACE STRUCTURES LOW LEFT(NO LINKED CHARGES)    Order Specific Question:   Reason for Exam (SYMPTOM  OR  DIAGNOSIS REQUIRED)    Answer:   left knee pain    Order Specific Question:   Preferred imaging location?    Answer:   [facility]   Ambulatory referral to Physical Therapy    Referral Priority:   Routine    Referral Type:   Physical Medicine    Referral Reason:   Specialty Services Required    Requested Specialty:   Physical Therapy    Number of Visits Requested:   1   No orders of the defined types were placed in this encounter.   Discussed warning signs or symptoms. Please see discharge instructions. Patient expresses understanding.   The above documentation has been reviewed and is accurate and complete Lynne Leader, M.D.

## 2022-10-06 NOTE — Patient Instructions (Addendum)
Thank you for coming in today.   You received an injection today. Seek immediate medical attention if the joint becomes red, extremely painful, or is oozing fluid.   Please use Voltaren gel (Generic Diclofenac Gel) up to 4x daily for pain as needed.  This is available over-the-counter as both the name brand Voltaren gel and the generic diclofenac gel.   I've referred you to Physical Therapy.  Let us know if you don't hear from them in one week.   Tylenol arthritis could help.   Recheck in 2 months.

## 2022-10-10 DIAGNOSIS — M5441 Lumbago with sciatica, right side: Secondary | ICD-10-CM | POA: Diagnosis not present

## 2022-10-10 DIAGNOSIS — M9903 Segmental and somatic dysfunction of lumbar region: Secondary | ICD-10-CM | POA: Diagnosis not present

## 2022-10-13 ENCOUNTER — Ambulatory Visit: Payer: Medicare PPO | Attending: Family Medicine | Admitting: Physical Therapy

## 2022-10-13 ENCOUNTER — Encounter: Payer: Self-pay | Admitting: Physical Therapy

## 2022-10-13 ENCOUNTER — Other Ambulatory Visit: Payer: Self-pay

## 2022-10-13 DIAGNOSIS — M25562 Pain in left knee: Secondary | ICD-10-CM | POA: Diagnosis not present

## 2022-10-13 DIAGNOSIS — M25462 Effusion, left knee: Secondary | ICD-10-CM | POA: Diagnosis not present

## 2022-10-13 DIAGNOSIS — G8929 Other chronic pain: Secondary | ICD-10-CM | POA: Insufficient documentation

## 2022-10-13 DIAGNOSIS — R600 Localized edema: Secondary | ICD-10-CM | POA: Diagnosis not present

## 2022-10-13 DIAGNOSIS — M6281 Muscle weakness (generalized): Secondary | ICD-10-CM | POA: Insufficient documentation

## 2022-10-13 DIAGNOSIS — R262 Difficulty in walking, not elsewhere classified: Secondary | ICD-10-CM | POA: Diagnosis not present

## 2022-10-13 NOTE — Therapy (Signed)
OUTPATIENT PHYSICAL THERAPY LOWER EXTREMITY EVALUATION   Patient Name: Virginia [name] MRN: [id number] DOB:[birth year], 81 y.o., female Today's Date: 10/13/2022   Past Medical History:  Diagnosis Date   ALLERGIC RHINITIS    Allergy    Anemia    Angiectasia 06/30/2019   small- in the small bowel   Anxiety    Blood transfusion without reported diagnosis    Cataract    beginning stage in lt. 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   s/p dilation   Scoliosis    Tubular adenoma of colon    Past Surgical History:  Procedure Laterality Date   child birth  22 & 26   COLONOSCOPY     ESOPHAGEAL DILATION N/A    Fibroidectomy     Low back surgery  01/1994   2 bulging disc   POLYPECTOMY     TONSILLECTOMY  1968   UPPER GASTROINTESTINAL ENDOSCOPY     Patient Active Problem List   Diagnosis Date Noted   Pain and swelling of left knee 09/27/2022   Pain and swelling of left lower leg 09/27/2022   Statin intolerance 08/20/2022   Apnea 02/17/2022   Snoring 02/17/2022   Brain fog 02/17/2022   Loose stools 02/13/2021   Racing heart beat 02/13/2021   Scoliosis w/ chronic back pain, chiropractor 08/01/2020   Hypertensive retinopathy 07/25/2020   Headache 02/02/2020   Tinnitus 05/24/2019   Pain of toe of right foot 05/24/2019   Left foot pain 05/13/2017   Diabetes mellitus without complication ([place]) 00/17/4944   Constipation 04/16/2016   Bilateral thumb pain 04/16/2016   Dysphagia 10/17/2015   Osteopenia 06/21/2014   Sciatica of right side 05/12/2011   SCHATZKI'S RING 01/01/2010   Osteoarthritis  01/01/2010   FIBROIDS, UTERUS 12/13/2009   Hyperlipidemia 12/13/2009   Essential hypertension 12/13/2009   ALLERGIC RHINITIS 12/13/2009   GERD (gastroesophageal reflux disease) 12/13/2009   COLONIC POLYPS, HX OF 12/13/2009    PCP: [name], MD  REFERRING PROVIDER: Gregor Hams, MD  REFERRING DIAG: Pain and swelling of left knee [M25.562, M25.462]   THERAPY DIAG:  Difficulty in walking, not elsewhere classified  Muscle weakness (generalized)  Chronic pain of left knee  Localized edema  Rationale for Evaluation and Treatment: Rehabilitation  ONSET DATE: 3 weeks ago   SUBJECTIVE:   SUBJECTIVE STATEMENT: Pt states that about 3 weeks ago she started having some pain in her L knee. X-rays were performed which yielded arthritis in bilat LE L>R. She reports being very active going up/ down stairs while carrying a heavy bag. She also reports almost falling, but was able to catch herself in the doorway.   PERTINENT HISTORY: Anxiety, DM, HTN, OA, scoliosis.  PAIN:  Are you having pain? Yes: NPRS scale: 4-5/10 Pain location: L anterior knee  Pain description: Achy  Aggravating factors: Stairs, sitting, walking, squatting, standing.  Relieving factors: Tylenol   PRECAUTIONS: None  WEIGHT BEARING RESTRICTIONS: No  FALLS:  Has patient fallen in last 6 months? No  LIVING ENVIRONMENT: Lives with: lives  alone Lives in: House/apartment Stairs: Yes: External: 1 steps; none Has following equipment at home: None  OCCUPATION: Retired   PLOF: Independent  PATIENT GOALS: Strengthen muscles in [place].   NEXT MD VISIT:   OBJECTIVE:   DIAGNOSTIC FINDINGS: Mild osteoarthritis of the left knee with small joint effusion.  PATIENT SURVEYS:  FOTO 43.3548%, 60% in 12 visits   COGNITION: Overall cognitive status: Within functional limits for tasks assessed     SENSATION: WFL  EDEMA:  Circumferential: L 18", R 18.75"    POSTURE: rounded shoulders and forward  head  PALPATION: Anterior/ medial knee at joint line.   LOWER EXTREMITY ROM:  Active ROM Right eval Left eval  Hip flexion [facility]  Knee flexion [facility] WFL  Knee extension WFL WFL   (Blank rows = not tested)  LOWER EXTREMITY MMT:  MMT Right eval Left eval  Hip flexion 5 5  Knee flexion 5 4+  Knee extension 5 4+   (Blank rows = not tested)  FUNCTIONAL TESTS:  5 times sit to stand: 17.48 sec   GAIT: Distance walked: 50 ft  Assistive device utilized: None Level of assistance: Complete Independence Comments: Pt ambulates with normal gait.   TODAY'S TREATMENT:                                                                                                                              DATE: Creating, reviewing, and completing below HEP    PATIENT EDUCATION:  Education details: Educated pt on anatomy and physiology of current symptoms, FOTO, diagnosis, prognosis, HEP,  and POC. Person educated: Patient Education method: Customer service manager Education comprehension: verbalized understanding and returned demonstration  HOME EXERCISE PROGRAM: Access Code: [facility] URL: [email or web address] Date: 10/13/2022 Prepared by: Rudi Heap  Exercises - Seated Long Arc Quad  - 2 x daily - 7 x weekly - 2 sets - 10 reps - Supine Hamstring Stretch with Strap  - 2 x daily - 7 x weekly - 2 sets - 10 reps - Supine Active Straight Leg Raise  - 2 x daily - 7 x weekly - 2 sets - 10 reps - Supine Bridge  - 2 x daily - 7 x weekly - 2 sets - 10 reps   ASSESSMENT:  CLINICAL IMPRESSION: Patient referred to PT for L knee pain and swelling. She has functional ROM and strength in bilat LE's she reports pain mostly with ascending/ descending stairs. She is very active maintaining her son's airbnb that requires her to go up/down stairs. Patient will benefit from skilled PT to address below impairments, limitations and improve overall function.  OBJECTIVE IMPAIRMENTS:  decreased activity tolerance, difficulty walking, decreased balance, decreased endurance, decreased mobility, decreased ROM, decreased strength, impaired flexibility, impaired UE/LE use, postural dysfunction, and pain.  ACTIVITY LIMITATIONS: bending, lifting, carry, locomotion, cleaning, community activity, driving, and or occupation  PERSONAL FACTORS: Anxiety, DM, HTN, OA, scoliosis are also affecting patient's functional outcome.  REHAB  POTENTIAL: Good  CLINICAL DECISION MAKING: Stable/uncomplicated  EVALUATION COMPLEXITY: Low    GOALS: Short term PT Goals Target date: 11/10/2022 Pt will be I and compliant with HEP. Baseline:  Goal status: New Pt will decrease pain by 25% overall Baseline: Goal status: New  Long term PT goals Target date: 12/08/2022 Pt will improve  R knee strength to at least 5-/5 MMT to improve functional strength Baseline: Goal status: New Pt will improve FOTO to at least 60% functional to show improved function Baseline: Goal status: New Pt will reduce pain by overall 50% overall with usual activity Baseline: Goal status: New Pt will reduce pain to overall less than 2-3/10 with usual activity and work activity. Baseline: Goal status: New Pt will improve her STS by 2.3 seconds for Minimal clinically important difference.  Baseline: Goal Status: New  PLAN: PT FREQUENCY: 1 times per week   PT DURATION: 8 weeks  PLANNED INTERVENTIONS (unless contraindicated): aquatic PT, Canalith repositioning, cryotherapy, Electrical stimulation, Iontophoresis with 4 mg/ml dexamethasome, Moist heat, traction, Ultrasound, gait training, Therapeutic exercise, balance training, neuromuscular re-education, patient/family education, prosthetic training, manual techniques, passive ROM, dry needling, taping, vasopnuematic device, vestibular, spinal manipulations, joint manipulations  PLAN FOR NEXT SESSION: Assess HEP/update PRN, continue to progress functional mobility,  strengthen anterior and posterior chain. Decrease patients pain and help minimize functional deficits.    Lynden Ang, PT 10/13/2022, 2:25 PM

## 2022-10-19 NOTE — Therapy (Unsigned)
OUTPATIENT PHYSICAL THERAPY TREATMENT NOTE   Patient Name: Virginia [name] MRN: [id number] DOB:1941/03/12, 81 y.o., female Today's Date: 10/20/2022  PCP:  [name], MD  REFERRING PROVIDER: Gregor Hams, MD   END OF SESSION:   PT End of Session - 10/20/22 1028     Visit Number 2    Number of Visits 16    Date for PT Re-Evaluation 12/15/21    Authorization Type [employer] MEDICARE CHOICE PPO    PT Start Time 1025   pt late   PT Stop Time 1100    PT Time Calculation (min) 35 min    Activity Tolerance Patient tolerated treatment well    Behavior During Therapy WFL for tasks assessed/performed             Past Medical History:  Diagnosis Date   ALLERGIC RHINITIS    Allergy    Anemia    Angiectasia 06/30/2019   small- in the small bowel   Anxiety    Blood transfusion without reported diagnosis    Cataract    beginning stage in lt. 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   s/p dilation   Scoliosis    Tubular adenoma of colon    Past Surgical History:  Procedure Laterality Date   child birth  14 & 37   COLONOSCOPY     ESOPHAGEAL DILATION N/A    Fibroidectomy     Low back surgery  01/1994   2 bulging disc   POLYPECTOMY     TONSILLECTOMY  1968   UPPER GASTROINTESTINAL ENDOSCOPY     Patient Active Problem List   Diagnosis Date Noted   Pain and swelling of left knee 09/27/2022   Pain and swelling of left lower leg 09/27/2022   Statin intolerance 08/20/2022   Apnea 02/17/2022   Snoring 02/17/2022   Brain fog 02/17/2022   Loose stools 02/13/2021   Racing heart beat 02/13/2021    Scoliosis w/ chronic back pain, chiropractor 08/01/2020   Hypertensive retinopathy 07/25/2020   Headache 02/02/2020   Tinnitus 05/24/2019   Pain of toe of right foot 05/24/2019   Left foot pain 05/13/2017   Diabetes mellitus without complication ([place]) 21/19/4174   Constipation 04/16/2016   Bilateral thumb pain 04/16/2016   Dysphagia 10/17/2015   Osteopenia 06/21/2014   Sciatica of right side 05/12/2011   SCHATZKI'S RING 01/01/2010   Osteoarthritis 01/01/2010   FIBROIDS, UTERUS 12/13/2009   Hyperlipidemia 12/13/2009   Essential hypertension 12/13/2009   ALLERGIC RHINITIS 12/13/2009   GERD (gastroesophageal reflux disease) 12/13/2009   COLONIC POLYPS, HX OF 12/13/2009    REFERRING DIAG: Pain and swelling of left knee [M25.562, M25.462]   THERAPY DIAG:  Difficulty in walking, not elsewhere classified  Muscle weakness (generalized)  Chronic pain of left knee  Localized edema  Rationale for Evaluation and Treatment Rehabilitation  PERTINENT HISTORY: Anxiety, DM, HTN, OA, scoliosis.    PRECAUTIONS: None  SUBJECTIVE:  SUBJECTIVE STATEMENT:  Sorry I was late. Not much pain this AM. Doing my exercises a little bit throughout the day.    PAIN:  Are you having pain? Yes: NPRS scale: 2/10 Pain location: [place] Pain description: dull Aggravating factors: constant Relieving factors: rest   OBJECTIVE: (objective measures completed at initial evaluation unless otherwise dated)  DIAGNOSTIC FINDINGS: Mild osteoarthritis of the left knee with small joint effusion.   PATIENT SURVEYS:  FOTO 43.3548%, 60% in 12 visits    COGNITION: Overall cognitive status: Within functional limits for tasks assessed                                    SENSATION: WFL   EDEMA:  Circumferential: L 18", R 18.75"       POSTURE: rounded shoulders and forward head   PALPATION: Anterior/ medial knee at joint line.    LOWER EXTREMITY ROM:   Active ROM Right eval Left eval  Hip flexion [facility]  Knee flexion [facility] WFL  Knee extension WFL WFL   (Blank rows = not tested)   LOWER EXTREMITY MMT:   MMT Right eval Left eval  Hip flexion 5 5  Knee flexion 5 4+  Knee extension 5 4+   (Blank rows = not tested)   FUNCTIONAL TESTS:  5 times sit to stand: 17.48 sec    GAIT: Distance walked: 50 ft  Assistive device utilized: None Level of assistance: Complete Independence Comments: Pt ambulates with normal gait.    TODAY'S TREATMENT:   10/20/22: Nustep L1 6 min with PTA present to discuss status Supine SLR 2x10 LT Supine bridge 2x5: VC for technique and to engage her core Seated ball squeeze: 5 sec hold 10x LAQ 5 sec hold 10x, VC to concentrate on "feeling" her quad                                                                                                                               DATE: Creating, reviewing, and completing below HEP      PATIENT EDUCATION:  Education details: Educated pt on anatomy and physiology of current symptoms, FOTO, diagnosis, prognosis, HEP,  and POC. Person educated: Patient Education method: Customer service manager Education comprehension: verbalized understanding and returned demonstration   HOME EXERCISE PROGRAM: Access Code: [facility] URL: [email or web address] Date: 10/13/2022 Prepared by: Rudi Heap   Exercises - Seated Long Arc Quad  - 2 x daily - 7 x weekly - 2 sets - 10 reps - Supine Hamstring Stretch with Strap  - 2 x daily - 7 x weekly - 2 sets - 10 reps - Supine Active Straight Leg Raise  - 2 x daily - 7 x weekly - 2 sets - 10 reps - Supine Bridge  - 2 x daily - 7 x weekly - 2 sets - 10 reps Added on 10/20/22: Seated ball squeezes  Myrene Galas, PTA 10/20/22 11:41 AM    ASSESSMENT:   CLINICAL IMPRESSION:  Pt  arrives with very minimal knee pain. She is semi-compliant with HEP. Pt admitts she is afraid to "hurt more" and hesitates doing her exercises especially when she hurts. Pt plans to try HEP more this week. Pt had no pain with exercises today and seemed to gain some confidence.    OBJECTIVE IMPAIRMENTS: decreased activity tolerance, difficulty walking, decreased balance, decreased endurance, decreased mobility, decreased ROM, decreased strength, impaired flexibility, impaired UE/LE use, postural dysfunction, and pain.   ACTIVITY LIMITATIONS: bending, lifting, carry, locomotion, cleaning, community activity, driving, and or occupation   PERSONAL FACTORS: Anxiety, DM, HTN, OA, scoliosis are also affecting patient's functional outcome.   REHAB POTENTIAL: Good   CLINICAL DECISION MAKING: Stable/uncomplicated   EVALUATION COMPLEXITY: Low       GOALS: Short term PT Goals Target date: 11/10/2022 Pt will be I and compliant with HEP. Baseline:  Goal status: New Pt will decrease pain by 25% overall Baseline: Goal status: New   Long term PT goals Target date: 12/08/2022 Pt will improve  R knee strength to at least 5-/5 MMT to improve functional strength Baseline: Goal status: New Pt will improve FOTO to at least 60% functional to show improved function Baseline: Goal status: New Pt will reduce pain by overall 50% overall with usual activity Baseline: Goal status: New Pt will reduce pain to overall less than 2-3/10 with usual activity and work activity. Baseline: Goal status: New Pt will improve her STS by 2.3 seconds for Minimal clinically important difference.  Baseline: Goal Status: New   PLAN: PT FREQUENCY: 1 times per week    PT DURATION: 8 weeks   PLANNED INTERVENTIONS (unless contraindicated): aquatic PT, Canalith repositioning, cryotherapy, Electrical stimulation, Iontophoresis with 4 mg/ml dexamethasome, Moist heat, traction, Ultrasound, gait training, Therapeutic exercise,  balance training, neuromuscular re-education, patient/family education, prosthetic training, manual techniques, passive ROM, dry needling, taping, vasopnuematic device, vestibular, spinal manipulations, joint manipulations   PLAN FOR NEXT SESSION: Assess HEP/update PRN, continue to progress functional mobility, strengthen anterior and posterior chain. Decrease patients pain and help minimize functional deficits.   Jaking Thayer, PTA 10/20/2022, 11:41 AM

## 2022-10-20 ENCOUNTER — Encounter: Payer: Medicare PPO | Admitting: Physical Therapy

## 2022-10-20 ENCOUNTER — Ambulatory Visit: Payer: Medicare PPO | Admitting: Physical Therapy

## 2022-10-20 ENCOUNTER — Encounter: Payer: Self-pay | Admitting: Physical Therapy

## 2022-10-20 DIAGNOSIS — G8929 Other chronic pain: Secondary | ICD-10-CM

## 2022-10-20 DIAGNOSIS — R262 Difficulty in walking, not elsewhere classified: Secondary | ICD-10-CM

## 2022-10-20 DIAGNOSIS — M25462 Effusion, left knee: Secondary | ICD-10-CM | POA: Diagnosis not present

## 2022-10-20 DIAGNOSIS — R600 Localized edema: Secondary | ICD-10-CM | POA: Diagnosis not present

## 2022-10-20 DIAGNOSIS — M6281 Muscle weakness (generalized): Secondary | ICD-10-CM

## 2022-10-20 DIAGNOSIS — M25562 Pain in left knee: Secondary | ICD-10-CM | POA: Diagnosis not present

## 2022-10-27 ENCOUNTER — Ambulatory Visit: Payer: Medicare PPO

## 2022-10-27 DIAGNOSIS — G8929 Other chronic pain: Secondary | ICD-10-CM

## 2022-10-27 DIAGNOSIS — M6281 Muscle weakness (generalized): Secondary | ICD-10-CM | POA: Diagnosis not present

## 2022-10-27 DIAGNOSIS — M25562 Pain in left knee: Secondary | ICD-10-CM | POA: Diagnosis not present

## 2022-10-27 DIAGNOSIS — R262 Difficulty in walking, not elsewhere classified: Secondary | ICD-10-CM

## 2022-10-27 DIAGNOSIS — M25462 Effusion, left knee: Secondary | ICD-10-CM | POA: Diagnosis not present

## 2022-10-27 DIAGNOSIS — R600 Localized edema: Secondary | ICD-10-CM

## 2022-10-27 NOTE — Therapy (Signed)
OUTPATIENT PHYSICAL THERAPY TREATMENT NOTE   Patient Name: Virginia [name] MRN: [id number] DOB:[birth year], 81 y.o., female Today's Date: 10/27/2022  PCP:  [name], MD  REFERRING PROVIDER: Gregor Hams, MD   END OF SESSION:   PT End of Session - 10/27/22 1224     Visit Number 3    Date for PT Re-Evaluation 12/15/22    Authorization Type [employer] CHO[employer] PPO    Authorization Time Period 12 visits 10/13/22-12/15/22    Authorization - Visit Number 3    Authorization - Number of Visits 12    PT Start Time 1146    PT Stop Time 1225    PT Time Calculation (min) 39 min    Activity Tolerance Patient tolerated treatment well    Behavior During Therapy WFL for tasks assessed/performed              Past Medical History:  Diagnosis Date   ALLERGIC RHINITIS    Allergy    Anemia    Angiectasia 06/30/2019   small- in the small bowel   Anxiety    Blood transfusion without reported diagnosis    Cataract    beginning stage in lt. 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   s/p dilation   Scoliosis    Tubular adenoma of colon    Past Surgical History:  Procedure Laterality Date   child birth  51 & 77   COLONOSCOPY     ESOPHAGEAL DILATION N/A    Fibroidectomy     Low back surgery  01/1994   2 bulging disc   POLYPECTOMY     TONSILLECTOMY  1968   UPPER GASTROINTESTINAL ENDOSCOPY     Patient Active Problem List   Diagnosis Date Noted   Pain and swelling of left knee 09/27/2022   Pain and swelling of left lower leg 09/27/2022   Statin intolerance 08/20/2022   Apnea 02/17/2022   Snoring  02/17/2022   Brain fog 02/17/2022   Loose stools 02/13/2021   Racing heart beat 02/13/2021   Scoliosis w/ chronic back pain, chiropractor 08/01/2020   Hypertensive retinopathy 07/25/2020   Headache 02/02/2020   Tinnitus 05/24/2019   Pain of toe of right foot 05/24/2019   Left foot pain 05/13/2017   Diabetes mellitus without complication ([place]) 66/44/0347   Constipation 04/16/2016   Bilateral thumb pain 04/16/2016   Dysphagia 10/17/2015   Osteopenia 06/21/2014   Sciatica of right side 05/12/2011   SCHATZKI'S RING 01/01/2010   Osteoarthritis 01/01/2010   FIBROIDS, UTERUS 12/13/2009   Hyperlipidemia 12/13/2009   Essential hypertension 12/13/2009   ALLERGIC RHINITIS 12/13/2009   GERD (gastroesophageal reflux disease) 12/13/2009   COLONIC POLYPS, HX OF 12/13/2009    REFERRING DIAG: Pain and swelling of left knee [M25.562, M25.462]   THERAPY DIAG:  Difficulty in walking, not elsewhere classified  Muscle weakness (generalized)  Chronic pain of left knee  Localized edema  Rationale for Evaluation and Treatment Rehabilitation  PERTINENT HISTORY: Anxiety, DM, HTN, OA, scoliosis.    PRECAUTIONS: None  SUBJECTIVE:  SUBJECTIVE STATEMENT:   I have been having a little bit more pain over the weekend due to being busy.     PAIN:  Are you having pain? Yes: NPRS scale: 4/10 Pain location: KNee Pain description: dull Aggravating factors: constant Relieving factors: rest   OBJECTIVE: (objective measures completed at initial evaluation unless otherwise dated)  DIAGNOSTIC FINDINGS: Mild osteoarthritis of the left knee with small joint effusion.   PATIENT SURVEYS:  FOTO 43.3548%, 60% in 12 visits    COGNITION: Overall cognitive status: Within functional limits for tasks assessed                                     SENSATION: WFL   EDEMA:  Circumferential: L 18", R 18.75"      POSTURE: rounded shoulders and forward head   PALPATION: Anterior/ medial knee at joint line.    LOWER EXTREMITY ROM:   Active ROM Right eval Left eval  Hip flexion [facility]  Knee flexion [facility] WFL  Knee extension WFL WFL   (Blank rows = not tested)   LOWER EXTREMITY MMT:   MMT Right eval Left eval  Hip flexion 5 5  Knee flexion 5 4+  Knee extension 5 4+   (Blank rows = not tested)   FUNCTIONAL TESTS:  5 times sit to stand: 17.48 sec    GAIT: Distance walked: 50 ft  Assistive device utilized: None Level of assistance: Complete Independence Comments: Pt ambulates with normal gait.    TODAY'S TREATMENT:  10/27/22: Nustep L1 x 6 min with PT present to discuss status Standing rockerboard: x 3 minutes Seated ball squeeze: 5 sec hold 10x Hip abduction with blue loop: 2x10  Seated hamstring curl: blue loop 2x10 LAQ 5 sec hold 10x  Weightshifting on balance pad x1 min Standing heel raises: 2x10  10/20/22: Nustep L1 6 min with PTA present to discuss status Supine SLR 2x10 LT Supine bridge 2x5: VC for technique and to engage her core Seated ball squeeze: 5 sec hold 10x LAQ 5 sec hold 10x, VC to concentrate on "feeling" her quad                                                                                                                               DATE: Creating, reviewing, and completing below HEP      PATIENT EDUCATION:  Education details: Educated pt on anatomy and physiology of current symptoms, FOTO, diagnosis, prognosis, HEP,  and POC. Person educated: Patient Education method: Customer service manager Education comprehension: verbalized understanding and returned demonstration   HOME EXERCISE PROGRAM: Access Code: [facility] URL: [email or web address] Date: 10/13/2022 Prepared by: Rudi Heap   Exercises - Seated Long Arc Quad  - 2 x daily - 7 x  weekly - 2 sets - 10 reps - Supine Hamstring Stretch with Strap  - 2 x daily - 7  x weekly - 2 sets - 10 reps - Supine Active Straight Leg Raise  - 2 x daily - 7 x weekly - 2 sets - 10 reps - Supine Bridge  - 2 x daily - 7 x weekly - 2 sets - 10 reps Added on 10/20/22: Seated ball squeezes    ASSESSMENT:   CLINICAL IMPRESSION:  Pt reports some elevated pain today.  Pt reports that she has been doing her exercises and has not been taking the Meloxicam that her MD prescribed.  She is considering trying this.  PT advised ice and elevation to control edema as she was using heat at home.  No increased pain with exercise in the clinic today and PT monitored for technique and pain.  Patient will benefit from skilled PT to address the below impairments and improve overall function.    OBJECTIVE IMPAIRMENTS: decreased activity tolerance, difficulty walking, decreased balance, decreased endurance, decreased mobility, decreased ROM, decreased strength, impaired flexibility, impaired UE/LE use, postural dysfunction, and pain.   ACTIVITY LIMITATIONS: bending, lifting, carry, locomotion, cleaning, community activity, driving, and or occupation   PERSONAL FACTORS: Anxiety, DM, HTN, OA, scoliosis are also affecting patient's functional outcome.   REHAB POTENTIAL: Good   CLINICAL DECISION MAKING: Stable/uncomplicated   EVALUATION COMPLEXITY: Low       GOALS: Short term PT Goals Target date: 11/10/2022 Pt will be I and compliant with HEP. Baseline: doing HEP (10/27/22) Goal status: In progress  Pt will decrease pain by 25% overall Baseline: Goal status: New   Long term PT goals Target date: 12/08/2022 Pt will improve  R knee strength to at least 5-/5 MMT to improve functional strength Baseline: Goal status: New Pt will improve FOTO to at least 60% functional to show improved function Baseline: Goal status: New Pt will reduce pain by overall 50% overall with usual activity Baseline: Goal status:  New Pt will reduce pain to overall less than 2-3/10 with usual activity and work activity. Baseline: Goal status: New Pt will improve her STS by 2.3 seconds for Minimal clinically important difference.  Baseline: Goal Status: New   PLAN: PT FREQUENCY: 1 times per week    PT DURATION: 8 weeks   PLANNED INTERVENTIONS (unless contraindicated): aquatic PT, Canalith repositioning, cryotherapy, Electrical stimulation, Iontophoresis with 4 mg/ml dexamethasome, Moist heat, traction, Ultrasound, gait training, Therapeutic exercise, balance training, neuromuscular re-education, patient/family education, prosthetic training, manual techniques, passive ROM, dry needling, taping, vasopnuematic device, vestibular, spinal manipulations, joint manipulations   PLAN FOR NEXT SESSION: Assess HEP/update PRN, continue to progress functional mobility, strengthen anterior and posterior chain. Decrease patients pain and help minimize functional deficits.   Sigurd Sos, PT 10/27/22 12:26 PM

## 2022-10-28 ENCOUNTER — Other Ambulatory Visit (INDEPENDENT_AMBULATORY_CARE_PROVIDER_SITE_OTHER): Payer: Medicare PPO

## 2022-10-28 ENCOUNTER — Encounter: Payer: Self-pay | Admitting: Internal Medicine

## 2022-10-28 DIAGNOSIS — E781 Pure hyperglyceridemia: Secondary | ICD-10-CM | POA: Insufficient documentation

## 2022-10-28 DIAGNOSIS — E7849 Other hyperlipidemia: Secondary | ICD-10-CM | POA: Diagnosis not present

## 2022-10-28 LAB — LIPID PANEL
Cholesterol: 247 mg/dL — ABNORMAL HIGH (ref 0–200)
HDL: 41.2 mg/dL (ref >39.00)
Total CHOL/HDL Ratio: 6
Triglycerides: 680 mg/dL — ABNORMAL HIGH (ref 0.0–149.0)

## 2022-10-28 LAB — LDL CHOLESTEROL, DIRECT: Direct LDL: 142 mg/dL

## 2022-10-28 LAB — HEPATIC FUNCTION PANEL
ALT: 21 U/L (ref 0–35)
AST: 17 U/L (ref 0–37)
Albumin: 4.1 g/dL (ref 3.5–5.2)
Alkaline Phosphatase: 56 U/L (ref 39–117)
Bilirubin, Direct: 0.1 mg/dL (ref 0.0–0.3)
Total Bilirubin: 0.7 mg/dL (ref 0.2–1.2)
Total Protein: 6.8 g/dL (ref 6.0–8.3)

## 2022-10-28 MED ORDER — ICOSAPENT ETHYL 1 G PO CAPS: 2.0000 g | ORAL_CAPSULE | Freq: Two times a day (BID) | ORAL | 5 refills | Status: DC

## 2022-11-02 ENCOUNTER — Other Ambulatory Visit: Payer: Self-pay | Admitting: Internal Medicine

## 2022-11-03 ENCOUNTER — Ambulatory Visit: Payer: Medicare PPO | Attending: Family Medicine | Admitting: Physical Therapy

## 2022-11-03 ENCOUNTER — Encounter: Payer: Medicare PPO | Admitting: Physical Therapy

## 2022-11-03 ENCOUNTER — Encounter: Payer: Self-pay | Admitting: Physical Therapy

## 2022-11-03 DIAGNOSIS — M6281 Muscle weakness (generalized): Secondary | ICD-10-CM | POA: Diagnosis not present

## 2022-11-03 DIAGNOSIS — G8929 Other chronic pain: Secondary | ICD-10-CM | POA: Diagnosis not present

## 2022-11-03 DIAGNOSIS — R262 Difficulty in walking, not elsewhere classified: Secondary | ICD-10-CM | POA: Diagnosis not present

## 2022-11-03 DIAGNOSIS — R600 Localized edema: Secondary | ICD-10-CM | POA: Insufficient documentation

## 2022-11-03 DIAGNOSIS — M25562 Pain in left knee: Secondary | ICD-10-CM | POA: Diagnosis not present

## 2022-11-03 NOTE — Therapy (Signed)
OUTPATIENT PHYSICAL THERAPY TREATMENT NOTE   Patient Name: Virginia [name] MRN: [id number] DOB:1941-11-18, 81 y.o., female Today's Date: 11/03/2022  PCP:  [name], MD  REFERRING PROVIDER: Gregor Hams, MD   END OF SESSION:   PT End of Session - 11/03/22 1020     Visit Number 4    Number of Visits 16    Date for PT Re-Evaluation 12/15/22    Authorization Type [employer] CHOICE PPO    Authorization Time Period 12 visits 10/13/22-12/15/22    Authorization - Visit Number 4    Authorization - Number of Visits 12    PT Start Time 1020    PT Stop Time 1100    PT Time Calculation (min) 40 min    Activity Tolerance Patient tolerated treatment well    Behavior During Therapy WFL for tasks assessed/performed               Past Medical History:  Diagnosis Date   ALLERGIC RHINITIS    Allergy    Anemia    Angiectasia 06/30/2019   small- in the small bowel   Anxiety    Blood transfusion without reported diagnosis    Cataract    beginning stage in lt. 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, 08/2009   s/p dilation   Scoliosis    Tubular adenoma of colon    Past Surgical History:  Procedure Laterality Date   child birth  80 & 42   COLONOSCOPY     ESOPHAGEAL DILATION N/A    Fibroidectomy     Low back surgery  01/1994   2 bulging disc   POLYPECTOMY     TONSILLECTOMY  1968   UPPER GASTROINTESTINAL ENDOSCOPY     Patient Active Problem List   Diagnosis Date Noted   Hypertriglyceridemia 10/28/2022   Pain and swelling of left knee 09/27/2022   Pain and swelling of left lower leg 09/27/2022    Statin intolerance 08/20/2022   Apnea 02/17/2022   Snoring 02/17/2022   Brain fog 02/17/2022   Loose stools 02/13/2021   Racing heart beat 02/13/2021   Scoliosis w/ chronic back pain, chiropractor 08/01/2020   Hypertensive retinopathy 07/25/2020   Headache 02/02/2020   Tinnitus 05/24/2019   Pain of toe of right foot 05/24/2019   Left foot pain 05/13/2017   Diabetes mellitus without complication ([place]) 91/50/5697   Constipation 04/16/2016   Bilateral thumb pain 04/16/2016   Dysphagia 10/17/2015   Osteopenia 06/21/2014   Sciatica of right side 05/12/2011   SCHATZKI'S RING 01/01/2010   Osteoarthritis 01/01/2010   FIBROIDS, UTERUS 12/13/2009   Hyperlipidemia 12/13/2009   Essential hypertension 12/13/2009   ALLERGIC RHINITIS 12/13/2009   GERD (gastroesophageal reflux disease) 12/13/2009   COLONIC POLYPS, HX OF 12/13/2009    REFERRING DIAG: Pain and swelling of left knee [M25.562, M25.462]   THERAPY DIAG:  Difficulty in walking, not elsewhere classified  Muscle weakness (generalized)  Chronic pain of left knee  Localized edema  Rationale for Evaluation and Treatment Rehabilitation  PERTINENT HISTORY: Anxiety, DM,  HTN, OA, scoliosis.    PRECAUTIONS: None  SUBJECTIVE:                                                                                                                                                                                      SUBJECTIVE STATEMENT:   My pain is getting better, less intense pain.     PAIN:  Are you having pain? Yes: NPRS scale: 2/10 Pain location: KNee Pain description: dull Aggravating factors: constant Relieving factors: rest   OBJECTIVE: (objective measures completed at initial evaluation unless otherwise dated)  DIAGNOSTIC FINDINGS: Mild osteoarthritis of the left knee with small joint effusion.   PATIENT SURVEYS:  FOTO 43.3548%, 60% in 12 visits    COGNITION: Overall cognitive status: Within functional limits for tasks  assessed                                    SENSATION: WFL   EDEMA:  Circumferential: L 18", R 18.75"      POSTURE: rounded shoulders and forward head   PALPATION: Anterior/ medial knee at joint line.    LOWER EXTREMITY ROM:   Active ROM Right eval Left eval  Hip flexion [facility]  Knee flexion [facility] WFL  Knee extension WFL WFL   (Blank rows = not tested)   LOWER EXTREMITY MMT:   MMT Right eval Left eval  Hip flexion 5 5  Knee flexion 5 4+  Knee extension 5 4+   (Blank rows = not tested)   FUNCTIONAL TESTS:  5 times sit to stand: 17.48 sec    GAIT: Distance walked: 50 ft  Assistive device utilized: None Level of assistance: Complete Independence Comments: Pt ambulates with normal gait.    TODAY'S TREATMENT:   11/03/22: Ball squeeze for warm up: 5 sec hold 10x Added 2# LAQ to ball squeeze 2x10 VC to hold at least 3 sec  Seated clamshells with yellow loop 2x10 3 way weight shift on mini trmap 1 min each no UE Standing hip abduction 2x10 Bil, VC for LTLE form Eccentric focus SLR 10x Nustep L2 x 5 min with PTA present to monitor   10/27/22: Nustep L1 x 6 min with PT present to discuss status Standing rockerboard: x 3 minutes Seated ball squeeze: 5 sec hold 10x Hip abduction with blue loop: 2x10  Seated hamstring curl: blue loop 2x10 LAQ 5 sec hold 10x  Weightshifting on balance pad x1 min Standing heel raises: 2x10  10/20/22: Nustep L1 6 min with PTA present to discuss status Supine SLR 2x10 LT Supine bridge 2x5: VC for  technique and to engage her core Seated ball squeeze: 5 sec hold 10x LAQ 5 sec hold 10x, VC to concentrate on "feeling" her quad                                                                                                                               DATE: Creating, reviewing, and completing below HEP      PATIENT EDUCATION:  Education details: Educated pt on anatomy and physiology of current symptoms, FOTO, diagnosis, prognosis,  HEP,  and POC. Person educated: Patient Education method: Customer service manager Education comprehension: verbalized understanding and returned demonstration   HOME EXERCISE PROGRAM: Access Code: [email or web address] URL: [email or web address] Date: 10/13/2022 Prepared by: Rudi Heap   Exercises - Seated Long Arc Quad  - 2 x daily - 7 x weekly - 2 sets - 10 reps - Supine Hamstring Stretch with Strap  - 2 x daily - 7 x weekly - 2 sets - 10 reps - Supine Active Straight Leg Raise  - 2 x daily - 7 x weekly - 2 sets - 10 reps - Supine Bridge  - 2 x daily - 7 x weekly - 2 sets - 10 reps Added on 10/20/22: Seated ball squeezes    ASSESSMENT:   CLINICAL IMPRESSION:  Pt arrives with mild knee pain and reports her pain intensity is decreasing. We introduced light resistance for strength today. Pt had no pain with CKC or OKC exercises. All STGs are met as of today.    OBJECTIVE IMPAIRMENTS: decreased activity tolerance, difficulty walking, decreased balance, decreased endurance, decreased mobility, decreased ROM, decreased strength, impaired flexibility, impaired UE/LE use, postural dysfunction, and pain.   ACTIVITY LIMITATIONS: bending, lifting, carry, locomotion, cleaning, community activity, driving, and or occupation   PERSONAL FACTORS: Anxiety, DM, HTN, OA, scoliosis are also affecting patient's functional outcome.   REHAB POTENTIAL: Good   CLINICAL DECISION MAKING: Stable/uncomplicated   EVALUATION COMPLEXITY: Low       GOALS: Short term PT Goals Target date: 11/10/2022  Pt will be I and compliant with HEP. Baseline: doing HEP (10/27/22) Goal status: Goal met 11/03/22  Pt will decrease pain by 25% overall Baseline: Goal status: Goal met 11/03/22 25% but no greater.   Long term PT goals Target date: 12/08/2022 Pt will improve  R knee strength to at least 5-/5 MMT to improve functional strength Baseline: Goal status: New Pt will improve FOTO to at least 60%  functional to show improved function Baseline: Goal status: New Pt will reduce pain by overall 50% overall with usual activity Baseline: Goal status: New Pt will reduce pain to overall less than 2-3/10 with usual activity and work activity. Baseline: Goal status: New Pt will improve her STS by 2.3 seconds for Minimal clinically important difference.  Baseline: Goal Status: New   PLAN: PT FREQUENCY: 1 times per week    PT DURATION: 8 weeks   PLANNED INTERVENTIONS (unless contraindicated): aquatic PT, Canalith repositioning,  cryotherapy, Electrical stimulation, Iontophoresis with 4 mg/ml dexamethasome, Moist heat, traction, Ultrasound, gait training, Therapeutic exercise, balance training, neuromuscular re-education, patient/family education, prosthetic training, manual techniques, passive ROM, dry needling, taping, vasopnuematic device, vestibular, spinal manipulations, joint manipulations   PLAN FOR NEXT SESSION: Assess HEP/update PRN, continue to progress functional mobility, strengthen anterior and posterior chain. Decrease patients pain and help minimize functional deficits.   Myrene Galas, PTA 11/03/22 10:51 AM

## 2022-11-05 DIAGNOSIS — M9903 Segmental and somatic dysfunction of lumbar region: Secondary | ICD-10-CM | POA: Diagnosis not present

## 2022-11-05 DIAGNOSIS — M5441 Lumbago with sciatica, right side: Secondary | ICD-10-CM | POA: Diagnosis not present

## 2022-11-10 ENCOUNTER — Encounter: Payer: Medicare PPO | Admitting: Physical Therapy

## 2022-11-10 ENCOUNTER — Ambulatory Visit: Payer: Medicare PPO | Admitting: Physical Therapy

## 2022-11-10 ENCOUNTER — Encounter: Payer: Self-pay | Admitting: Physical Therapy

## 2022-11-10 DIAGNOSIS — M6281 Muscle weakness (generalized): Secondary | ICD-10-CM

## 2022-11-10 DIAGNOSIS — R262 Difficulty in walking, not elsewhere classified: Secondary | ICD-10-CM

## 2022-11-10 DIAGNOSIS — G8929 Other chronic pain: Secondary | ICD-10-CM | POA: Diagnosis not present

## 2022-11-10 DIAGNOSIS — M25562 Pain in left knee: Secondary | ICD-10-CM | POA: Diagnosis not present

## 2022-11-10 DIAGNOSIS — R600 Localized edema: Secondary | ICD-10-CM | POA: Diagnosis not present

## 2022-11-10 NOTE — Therapy (Signed)
OUTPATIENT PHYSICAL THERAPY TREATMENT NOTE   Patient Name: Virginia [name] MRN: [id number] DOB:06-19-1941, 81 y.o., female Today's Date: 11/10/2022  PCP:  [name], MD  REFERRING PROVIDER: Gregor Hams, MD   END OF SESSION:   PT End of Session - 11/10/22 1020     Visit Number 5    Number of Visits 16    Date for PT Re-Evaluation 12/15/22    Authorization Type [employer] CHO[employer] PPO    Authorization Time Period 12 visits 10/13/22-12/15/22    Authorization - Visit Number 5    Authorization - Number of Visits 12    PT Start Time 1020    PT Stop Time 1052    PT Time Calculation (min) 32 min    Activity Tolerance Patient tolerated treatment well    Behavior During Therapy WFL for tasks assessed/performed                Past Medical History:  Diagnosis Date   ALLERGIC RHINITIS    Allergy    Anemia    Angiectasia 06/30/2019   small- in the small bowel   Anxiety    Blood transfusion without reported diagnosis    Cataract    beginning stage in lt. 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   s/p dilation   Scoliosis    Tubular adenoma of colon    Past Surgical History:  Procedure Laterality Date   child birth  28 & 72   COLONOSCOPY     ESOPHAGEAL DILATION N/A    Fibroidectomy     Low back surgery  01/1994   2 bulging disc   POLYPECTOMY     TONSILLECTOMY  1968   UPPER GASTROINTESTINAL ENDOSCOPY     Patient Active Problem List   Diagnosis Date Noted   Hypertriglyceridemia 10/28/2022   Pain and swelling of left knee 09/27/2022   Pain and swelling of left lower leg 09/27/2022    Statin intolerance 08/20/2022   Apnea 02/17/2022   Snoring 02/17/2022   Brain fog 02/17/2022   Loose stools 02/13/2021   Racing heart beat 02/13/2021   Scoliosis w/ chronic back pain, chiropractor 08/01/2020   Hypertensive retinopathy 07/25/2020   Headache 02/02/2020   Tinnitus 05/24/2019   Pain of toe of right foot 05/24/2019   Left foot pain 05/13/2017   Diabetes mellitus without complication ([place]) 02/19/2247   Constipation 04/16/2016   Bilateral thumb pain 04/16/2016   Dysphagia 10/17/2015   Osteopenia 06/21/2014   Sciatica of right side 05/12/2011   SCHATZKI'S RING 01/01/2010   Osteoarthritis 01/01/2010   FIBROIDS, UTERUS 12/13/2009   Hyperlipidemia 12/13/2009   Essential hypertension 12/13/2009   ALLERGIC RHINITIS 12/13/2009   GERD (gastroesophageal reflux disease) 12/13/2009   COLONIC POLYPS, HX OF 12/13/2009    REFERRING DIAG: Pain and swelling of left knee [M25.562, M25.462]   THERAPY DIAG:  Difficulty in walking, not elsewhere classified  Muscle weakness (generalized)  Chronic pain of left knee  Localized edema  Rationale for Evaluation and Treatment Rehabilitation  PERTINENT HISTORY: Anxiety,  DM, HTN, OA, scoliosis.    PRECAUTIONS: None  SUBJECTIVE:                                                                                                                                                                                      SUBJECTIVE STATEMENT:   My pain is getting better, less pain when I do stairs.   PAIN:  Are you having pain? Not right now   OBJECTIVE: (objective measures completed at initial evaluation unless otherwise dated)  DIAGNOSTIC FINDINGS: Mild osteoarthritis of the left knee with small joint effusion.   PATIENT SURVEYS:  FOTO 43.3548%, 60% in 12 visits    COGNITION: Overall cognitive status: Within functional limits for tasks assessed                                    SENSATION: WFL   EDEMA:  Circumferential: L 18",  R 18.75"      POSTURE: rounded shoulders and forward head   PALPATION: Anterior/ medial knee at joint line.    LOWER EXTREMITY ROM:   Active ROM Right eval Left eval  Hip flexion [facility]  Knee flexion [facility] WFL  Knee extension WFL WFL   (Blank rows = not tested)   LOWER EXTREMITY MMT:   MMT Right eval Left eval  Hip flexion 5 5  Knee flexion 5 4+  Knee extension 5 4+   (Blank rows = not tested)   FUNCTIONAL TESTS:  5 times sit to stand: 17.48 sec  11/10/22: 5x sit to stand 13 sec   GAIT: Distance walked: 50 ft  Assistive device utilized: None Level of assistance: Complete Independence Comments: Pt ambulates with normal gait.    TODAY'S TREATMENT:   11/10/22: Nustep L2 7 min with PTA present to monitor and discuss current status Seated ball squeezes 5 sec hold 12x LAQ 2.5# & ball squeeze 2x10 Seated clamshells yellow loop 2x15 Standing hip abduction 1.5# Bil 2x10 Standing heel raises 20x   11/03/22: Ball squeeze for warm up: 5 sec hold 10x Added 2# LAQ to ball squeeze 2x10 VC to hold at least 3 sec  Seated clamshells with yellow loop 2x10 3 way weight shift on mini trmap 1 min each no UE Standing hip abduction 2x10 Bil, VC for LTLE form Eccentric focus SLR 10x Nustep L2 x 5 min with PTA present to monitor   10/27/22: Nustep L1 x 6 min with PT present to discuss status Standing rockerboard: x 3 minutes Seated ball squeeze: 5 sec hold 10x Hip abduction with blue loop: 2x10  Seated hamstring curl:  blue loop 2x10 LAQ 5 sec hold 10x  Weightshifting on balance pad x1 min Standing heel raises: 2x10                                                                                                                                DATE: Creating, reviewing, and completing below HEP      PATIENT EDUCATION:  Education details: Educated pt on anatomy and physiology of current symptoms, FOTO, diagnosis, prognosis, HEP,  and POC. Person educated: Patient Education  method: Customer service manager Education comprehension: verbalized understanding and returned demonstration   HOME EXERCISE PROGRAM: Access Code: [facility] URL: [email or web address] Date: 10/13/2022 Prepared by: Rudi Heap   Exercises - Seated Long Arc Quad  - 2 x daily - 7 x weekly - 2 sets - 10 reps - Supine Hamstring Stretch with Strap  - 2 x daily - 7 x weekly - 2 sets - 10 reps - Supine Active Straight Leg Raise  - 2 x daily - 7 x weekly - 2 sets - 10 reps - Supine Bridge  - 2 x daily - 7 x weekly - 2 sets - 10 reps Added on 10/20/22: Seated ball squeezes    ASSESSMENT:   CLINICAL IMPRESSION:  Pt continues to report improved mobility of her knee and less pain. Biggest improvement is not having to lift her leg with her arms to get in and out of the car and stairs are becoming much easier. Pt improved her sit to stand by  4.5 sec meeting LGT.   OBJECTIVE IMPAIRMENTS: decreased activity tolerance, difficulty walking, decreased balance, decreased endurance, decreased mobility, decreased ROM, decreased strength, impaired flexibility, impaired UE/LE use, postural dysfunction, and pain.   ACTIVITY LIMITATIONS: bending, lifting, carry, locomotion, cleaning, community activity, driving, and or occupation   PERSONAL FACTORS: Anxiety, DM, HTN, OA, scoliosis are also affecting patient's functional outcome.   REHAB POTENTIAL: Good   CLINICAL DECISION MAKING: Stable/uncomplicated   EVALUATION COMPLEXITY: Low       GOALS: Short term PT Goals Target date: 11/10/2022  Pt will be I and compliant with HEP. Baseline: doing HEP (10/27/22) Goal status: Goal met 11/03/22  Pt will decrease pain by 25% overall Baseline: Goal status: Goal met 11/03/22 25% but no greater.   Long term PT goals Target date: 12/08/2022 Pt will improve  R knee strength to at least 5-/5 MMT to improve functional strength Baseline: Goal status: New Pt will improve FOTO to at least 60%  functional to show improved function Baseline: Goal status: New Pt will reduce pain by overall 50% overall with usual activity Baseline: Goal status: New Pt will reduce pain to overall less than 2-3/10 with usual activity and work activity. Baseline: Goal status: New Pt will improve her STS by 2.3 seconds for Minimal clinically important difference.  Baseline: Goal Status: Goal met 11/10/22  PLAN: PT FREQUENCY: 1 times per week    PT DURATION: 8 weeks  PLANNED INTERVENTIONS (unless contraindicated): aquatic PT, Canalith repositioning, cryotherapy, Electrical stimulation, Iontophoresis with 4 mg/ml dexamethasome, Moist heat, traction, Ultrasound, gait training, Therapeutic exercise, balance training, neuromuscular re-education, patient/family education, prosthetic training, manual techniques, passive ROM, dry needling, taping, vasopnuematic device, vestibular, spinal manipulations, joint manipulations   PLAN FOR NEXT SESSION: Pt going to [place] for a few days, will be back early next week and will try to schedule for end of week.   Myrene Galas, PTA 11/10/22 10:51 AM

## 2022-11-19 ENCOUNTER — Ambulatory Visit: Payer: Medicare PPO | Admitting: Physical Therapy

## 2022-11-19 DIAGNOSIS — R262 Difficulty in walking, not elsewhere classified: Secondary | ICD-10-CM

## 2022-11-19 DIAGNOSIS — G8929 Other chronic pain: Secondary | ICD-10-CM

## 2022-11-19 DIAGNOSIS — R600 Localized edema: Secondary | ICD-10-CM

## 2022-11-19 DIAGNOSIS — M6281 Muscle weakness (generalized): Secondary | ICD-10-CM

## 2022-11-19 DIAGNOSIS — M25562 Pain in left knee: Secondary | ICD-10-CM | POA: Diagnosis not present

## 2022-11-19 NOTE — Therapy (Signed)
OUTPATIENT PHYSICAL THERAPY TREATMENT NOTE   Patient Name: Virginia [name] MRN: [id number] DOB:12-30-1940, 81 y.o., female Today's Date: 11/19/2022  PCP:  [name], MD  REFERRING PROVIDER: Gregor Hams, MD   END OF SESSION:   PT End of Session - 11/19/22 1432     Visit Number 6    Number of Visits 16    Date for PT Re-Evaluation 12/15/22    Authorization Type [employer] CHOICE PPO    Authorization Time Period 12 visits 10/13/22-12/15/22    Authorization - Visit Number 6    Authorization - Number of Visits 12    PT Start Time 4967    PT Stop Time 1445    PT Time Calculation (min) 20 min    Activity Tolerance Patient tolerated treatment well    Behavior During Therapy WFL for tasks assessed/performed                 Past Medical History:  Diagnosis Date   ALLERGIC RHINITIS    Allergy    Anemia    Angiectasia 06/30/2019   small- in the small bowel   Anxiety    Blood transfusion without reported diagnosis    Cataract    beginning stage in lt. 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   s/p dilation   Scoliosis    Tubular adenoma of colon    Past Surgical History:  Procedure Laterality Date   child birth  54 & 68   COLONOSCOPY     ESOPHAGEAL DILATION N/A    Fibroidectomy     Low back surgery  01/1994   2 bulging disc   POLYPECTOMY     TONSILLECTOMY  1968   UPPER GASTROINTESTINAL ENDOSCOPY     Patient Active Problem List   Diagnosis Date Noted   Hypertriglyceridemia 10/28/2022   Pain and swelling of left knee 09/27/2022   Pain and swelling of left lower leg 09/27/2022    Statin intolerance 08/20/2022   Apnea 02/17/2022   Snoring 02/17/2022   Brain fog 02/17/2022   Loose stools 02/13/2021   Racing heart beat 02/13/2021   Scoliosis w/ chronic back pain, chiropractor 08/01/2020   Hypertensive retinopathy 07/25/2020   Headache 02/02/2020   Tinnitus 05/24/2019   Pain of toe of right foot 05/24/2019   Left foot pain 05/13/2017   Diabetes mellitus without complication ([place]) 59/16/3846   Constipation 04/16/2016   Bilateral thumb pain 04/16/2016   Dysphagia 10/17/2015   Osteopenia 06/21/2014   Sciatica of right side 05/12/2011   SCHATZKI'S RING 01/01/2010   Osteoarthritis 01/01/2010   FIBROIDS, UTERUS 12/13/2009   Hyperlipidemia 12/13/2009   Essential hypertension 12/13/2009   ALLERGIC RHINITIS 12/13/2009   GERD (gastroesophageal reflux disease) 12/13/2009   COLONIC POLYPS, HX OF 12/13/2009    REFERRING DIAG: Pain and swelling of left knee [M25.562, M25.462]   THERAPY DIAG:  Difficulty in walking, not elsewhere classified  Chronic pain of left knee  Muscle weakness (generalized)  Localized edema  Rationale for Evaluation and Treatment Rehabilitation  PERTINENT HISTORY:  Anxiety, DM, HTN, OA, scoliosis.    PRECAUTIONS: None  SUBJECTIVE:                                                                                                                                                                                      SUBJECTIVE STATEMENT:   Pt states that she had another appt and is now running late but still wants to be seen. She reports intermittent swelling, but much less than originally. Standing continues to make her L knee painful.     PAIN:  Are you having pain? Not right now   OBJECTIVE: (objective measures completed at initial evaluation unless otherwise dated)  DIAGNOSTIC FINDINGS: Mild osteoarthritis of the left knee with small joint effusion.   PATIENT SURVEYS:  FOTO 43.3548%, 60% in 12 visits    COGNITION: Overall  cognitive status: Within functional limits for tasks assessed                                    SENSATION: WFL   EDEMA:  Circumferential: L 18", R 18.75"      POSTURE: rounded shoulders and forward head   PALPATION: Anterior/ medial knee at joint line.    LOWER EXTREMITY ROM:   Active ROM Right eval Left eval  Hip flexion [facility]  Knee flexion [facility] WFL  Knee extension WFL WFL   (Blank rows = not tested)   LOWER EXTREMITY MMT:   MMT Right eval Left eval  Hip flexion 5 5  Knee flexion 5 4+  Knee extension 5 4+   (Blank rows = not tested)   FUNCTIONAL TESTS:  5 times sit to stand: 17.48 sec  11/10/22: 5x sit to stand 13 sec   GAIT: Distance walked: 50 ft  Assistive device utilized: None Level of assistance: Complete Independence Comments: Pt ambulates with normal gait.    TODAY'S TREATMENT:   11/19/22: Nustep L5 14mn with PT present to monitor and discuss current status Seated ball squeezes 5 sec hold 12x LAQ 2.5# & ball squeeze 2x10 Seated clamshells red loop 2x15 Standing hip abduction 1.5# Bil 2x10 Standing heel raises 20x  11/10/22: Nustep L2  716m with PT present to monitor and discuss current status Seated ball squeezes 5 sec hold 12x LAQ 2.5# & ball squeeze 2x10 Seated clamshells yellow loop 2x15 Standing hip abduction 1.5# Bil 2x10 Standing heel raises 20x   11/03/22: Ball squeeze for warm up: 5 sec hold 10x Added 2# LAQ to ball squeeze 2x10 VC to hold at least 3 sec  Seated clamshells with yellow loop 2x10 3 way  weight shift on mini trmap 1 min each no UE Standing hip abduction 2x10 Bil, VC for LTLE form Eccentric focus SLR 10x Nustep L2 x 5 min with PTA present to monitor   10/27/22: Nustep L1 x 6 min with PT present to discuss status Standing rockerboard: x 3 minutes Seated ball squeeze: 5 sec hold 10x Hip abduction with blue loop: 2x10  Seated hamstring curl: blue loop 2x10 LAQ 5 sec hold 10x  Weightshifting on balance pad x1  min Standing heel raises: 2x10   DATE: Creating, reviewing, and completing below HEP      PATIENT EDUCATION:  Education details: Educated pt on anatomy and physiology of current symptoms, FOTO, diagnosis, prognosis, HEP,  and POC. Person educated: Patient Education method: Customer service manager Education comprehension: verbalized understanding and returned demonstration   HOME EXERCISE PROGRAM: Access Code: [facility] URL: [email or web address] Date: 10/13/2022 Prepared by: Rudi Heap   Exercises - Seated Long Arc Quad  - 2 x daily - 7 x weekly - 2 sets - 10 reps - Supine Hamstring Stretch with Strap  - 2 x daily - 7 x weekly - 2 sets - 10 reps - Supine Active Straight Leg Raise  - 2 x daily - 7 x weekly - 2 sets - 10 reps - Supine Bridge  - 2 x daily - 7 x weekly - 2 sets - 10 reps Added on 10/20/22: Seated ball squeezes    ASSESSMENT:   CLINICAL IMPRESSION:  Pt arrived late to PT, but insisted to be seen for remaining ~20 minutes. Session with focus on LE mobility and strengthening. Pt tolerated session well but requires cues for slow controlled movements. Pt will continue to benefit from skilled PT to address continued deficits.     OBJECTIVE IMPAIRMENTS: decreased activity tolerance, difficulty walking, decreased balance, decreased endurance, decreased mobility, decreased ROM, decreased strength, impaired flexibility, impaired UE/LE use, postural dysfunction, and pain.   ACTIVITY LIMITATIONS: bending, lifting, carry, locomotion, cleaning, community activity, driving, and or occupation   PERSONAL FACTORS: Anxiety, DM, HTN, OA, scoliosis are also affecting patient's functional outcome.   REHAB POTENTIAL: Good   CLINICAL DECISION MAKING: Stable/uncomplicated   EVALUATION COMPLEXITY: Low       GOALS: Short term PT Goals Target date: 11/10/2022  Pt will be I and compliant with HEP. Baseline: doing HEP (10/27/22) Goal status: Goal met  11/03/22  Pt will decrease pain by 25% overall Baseline: Goal status: Goal met 11/03/22 25% but no greater.   Long term PT goals Target date: 12/08/2022 Pt will improve  R knee strength to at least 5-/5 MMT to improve functional strength Baseline: Goal status: New Pt will improve FOTO to at least 60% functional to show improved function Baseline: Goal status: New Pt will reduce pain by overall 50% overall with usual activity Baseline: Goal status: New Pt will reduce pain to overall less than 2-3/10 with usual activity and work activity. Baseline:  Goal status: 11/19/2022 3/10.  Pt will improve her STS by 2.3 seconds for Minimal clinically important difference.  Baseline: Goal Status: Goal met 11/10/22  PLAN: PT FREQUENCY: 1 times per week    PT DURATION: 8 weeks   PLANNED INTERVENTIONS (unless contraindicated): aquatic PT, Canalith repositioning, cryotherapy, Electrical stimulation, Iontophoresis with 4 mg/ml dexamethasome, Moist heat, traction, Ultrasound, gait training, Therapeutic exercise, balance training, neuromuscular re-education, patient/family education, prosthetic training, manual techniques, passive ROM, dry needling, taping, vasopnuematic device, vestibular, spinal manipulations, joint manipulations   PLAN FOR NEXT SESSION: Pt going to  [place] for a few days, will be back early next week and will try to schedule for end of week.   Rudi Heap PT, DPT 11/19/22  2:33 PM

## 2022-11-21 ENCOUNTER — Other Ambulatory Visit: Payer: Self-pay | Admitting: Physician Assistant

## 2022-11-21 DIAGNOSIS — M5441 Lumbago with sciatica, right side: Secondary | ICD-10-CM | POA: Diagnosis not present

## 2022-11-21 DIAGNOSIS — M9903 Segmental and somatic dysfunction of lumbar region: Secondary | ICD-10-CM | POA: Diagnosis not present

## 2022-12-03 ENCOUNTER — Telehealth: Payer: Self-pay | Admitting: *Deleted

## 2022-12-03 NOTE — Telephone Encounter (Signed)
L knee Visco initiated through portal.

## 2022-12-03 NOTE — Telephone Encounter (Signed)
-----   Message from Mare Ferrari sent at 11/27/2022  2:44 PM EST ----- Regarding: authorize Please authorize visco and zilretta for pt's L knee.

## 2022-12-05 ENCOUNTER — Ambulatory Visit: Payer: Medicare PPO | Attending: Family Medicine | Admitting: Physical Therapy

## 2022-12-05 ENCOUNTER — Encounter: Payer: Self-pay | Admitting: Physical Therapy

## 2022-12-05 DIAGNOSIS — M6281 Muscle weakness (generalized): Secondary | ICD-10-CM

## 2022-12-05 DIAGNOSIS — R600 Localized edema: Secondary | ICD-10-CM | POA: Diagnosis not present

## 2022-12-05 DIAGNOSIS — M25562 Pain in left knee: Secondary | ICD-10-CM | POA: Insufficient documentation

## 2022-12-05 DIAGNOSIS — R262 Difficulty in walking, not elsewhere classified: Secondary | ICD-10-CM | POA: Diagnosis not present

## 2022-12-05 DIAGNOSIS — G8929 Other chronic pain: Secondary | ICD-10-CM

## 2022-12-05 NOTE — Therapy (Signed)
OUTPATIENT PHYSICAL THERAPY TREATMENT NOTE   Patient Name: Virginia [name] MRN: [id number] DOB:1941/10/07, 82 y.o., female Today's Date: 12/05/2022  PCP:  [name], MD  REFERRING PROVIDER: Gregor Hams, MD   END OF SESSION:   PT End of Session - 12/05/22 0934     Visit Number 7    Number of Visits 16    Date for PT Re-Evaluation 12/15/22    Authorization Type [employer]IC[employer] CHOICE PPO    Authorization Time Period 12 visits 10/13/22-12/15/22    Authorization - Visit Number 7    Authorization - Number of Visits 12    PT Start Time 0933    PT Stop Time 1011    PT Time Calculation (min) 38 min    Activity Tolerance Patient tolerated treatment well    Behavior During Therapy WFL for tasks assessed/performed                 Past Medical History:  Diagnosis Date   ALLERGIC RHINITIS    Allergy    Anemia    Angiectasia 06/30/2019   small- in the small bowel   Anxiety    Blood transfusion without reported diagnosis    Cataract    beginning stage in lt. 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   s/p dilation   Scoliosis    Tubular adenoma of colon    Past Surgical History:  Procedure Laterality Date   child birth  37 & 62   COLONOSCOPY     ESOPHAGEAL DILATION N/A    Fibroidectomy     Low back surgery  01/1994   2 bulging disc   POLYPECTOMY     TONSILLECTOMY  1968   UPPER GASTROINTESTINAL ENDOSCOPY     Patient Active Problem List   Diagnosis Date Noted   Hypertriglyceridemia 10/28/2022   Pain and swelling of left knee 09/27/2022   Pain and swelling of left lower leg 09/27/2022    Statin intolerance 08/20/2022   Apnea 02/17/2022   Snoring 02/17/2022   Brain fog 02/17/2022   Loose stools 02/13/2021   Racing heart beat 02/13/2021   Scoliosis w/ chronic back pain, chiropractor 08/01/2020   Hypertensive retinopathy 07/25/2020   Headache 02/02/2020   Tinnitus 05/24/2019   Pain of toe of right foot 05/24/2019   Left foot pain 05/13/2017   Diabetes mellitus without complication ([place]) 16/12/930   Constipation 04/16/2016   Bilateral thumb pain 04/16/2016   Dysphagia 10/17/2015   Osteopenia 06/21/2014   Sciatica of right side 05/12/2011   SCHATZKI'S RING 01/01/2010   Osteoarthritis 01/01/2010   FIBROIDS, UTERUS 12/13/2009   Hyperlipidemia 12/13/2009   Essential hypertension 12/13/2009   ALLERGIC RHINITIS 12/13/2009   GERD (gastroesophageal reflux disease) 12/13/2009   COLONIC POLYPS, HX OF 12/13/2009    REFERRING DIAG: Pain and swelling of left knee [M25.562, M25.462]   THERAPY DIAG:  Difficulty in walking, not elsewhere classified  Chronic pain of left knee  Muscle weakness (generalized)  Localized edema  Rationale for Evaluation and Treatment Rehabilitation  PERTINENT HISTORY:  Anxiety, DM, HTN, OA, scoliosis.    PRECAUTIONS: None  SUBJECTIVE:                                                                                                                                                                                      SUBJECTIVE STATEMENT:   Since evaluation pt reports a 75% improvement in symptoms. PAIN:  Are you having pain? Not right now   OBJECTIVE: (objective measures completed at initial evaluation unless otherwise dated)  DIAGNOSTIC FINDINGS: Mild osteoarthritis of the left knee with small joint effusion.   PATIENT SURVEYS:  FOTO 43.3548%, 60% in 12 visits    COGNITION: Overall cognitive status: Within functional limits for tasks assessed                                    SENSATION: WFL   EDEMA:  Circumferential: L 18",  R 18.75"      POSTURE: rounded shoulders and forward head   PALPATION: Anterior/ medial knee at joint line.    LOWER EXTREMITY ROM:   Active ROM Right eval Left eval  Hip flexion [facility]  Knee flexion [facility] WFL  Knee extension WFL WFL   (Blank rows = not tested)   LOWER EXTREMITY MMT:   MMT Right eval Left eval  Hip flexion 5 5  Knee flexion 5 4+  Knee extension 5 4+   (Blank rows = not tested)   FUNCTIONAL TESTS:  5 times sit to stand: 17.48 sec  11/10/22: 5x sit to stand 13 sec   GAIT: Distance walked: 50 ft  Assistive device utilized: None Level of assistance: Complete Independence Comments: Pt ambulates with normal gait.    TODAY'S TREATMENT:   12/05/21: Nustep L4 x 6 min with PTA present: VC to push with quads more. Seated ball squeezes 15x Seated clamshells redloop 3x10: gave pt green band to take home to do LAQ 3# 10x Bil Standing hip abd 2# 10x Bil at [place] marching with slight lean back 3# 15x Forward step ups 10x with light UE Step downs off first step with 2 hands on rails a little more 10x Bil Standing against the wall toes raises 2x10 3 sec hold   11/19/22: Nustep L5 42mn with PT present to monitor and discuss current status Seated ball squeezes 5 sec hold 12x LAQ 2.5# & ball squeeze 2x10 Seated clamshells red loop 2x15 Standing hip abduction 1.5# Bil 2x10 Standing heel raises 20x  11/10/22: Nustep L2  749m with PT present to monitor and discuss current status Seated ball squeezes 5 sec hold 12x  LAQ 2.5# & ball squeeze 2x10 Seated clamshells yellow loop 2x15 Standing hip abduction 1.5# Bil 2x10 Standing heel raises 20x    DATE: Creating, reviewing, and completing below HEP      PATIENT EDUCATION:  Education details: Educated pt on anatomy and physiology of current symptoms, FOTO, diagnosis, prognosis, HEP,  and POC. Person educated: Patient Education method: Customer service manager Education comprehension: verbalized  understanding and returned demonstration   HOME EXERCISE PROGRAM: Access Code: [facility] URL: [email or web address] Date: 10/13/2022 Prepared by: Rudi Heap   Exercises - Seated Long Arc Quad  - 2 x daily - 7 x weekly - 2 sets - 10 reps - Supine Hamstring Stretch with Strap  - 2 x daily - 7 x weekly - 2 sets - 10 reps - Supine Active Straight Leg Raise  - 2 x daily - 7 x weekly - 2 sets - 10 reps - Supine Bridge  - 2 x daily - 7 x weekly - 2 sets - 10 reps Added on 10/20/22: Seated ball squeezes    ASSESSMENT:   CLINICAL IMPRESSION:   Pt arrives with no knee pain and reports overall improvement of 75% less symptoms. Increased resistance with some exercises and added step ups for weightbearing. No issues with any exercises.   OBJECTIVE IMPAIRMENTS: decreased activity tolerance, difficulty walking, decreased balance, decreased endurance, decreased mobility, decreased ROM, decreased strength, impaired flexibility, impaired UE/LE use, postural dysfunction, and pain.   ACTIVITY LIMITATIONS: bending, lifting, carry, locomotion, cleaning, community activity, driving, and or occupation   PERSONAL FACTORS: Anxiety, DM, HTN, OA, scoliosis are also affecting patient's functional outcome.   REHAB POTENTIAL: Good   CLINICAL DECISION MAKING: Stable/uncomplicated   EVALUATION COMPLEXITY: Low       GOALS: Short term PT Goals Target date: 11/10/2022  Pt will be I and compliant with HEP. Baseline: doing HEP (10/27/22) Goal status: Goal met 11/03/22  Pt will decrease pain by 25% overall Baseline: Goal status: Goal met 11/03/22 25% but no greater.   Long term PT goals Target date: 12/08/2022  Pt will improve  R knee strength to at least 5-/5 MMT to improve functional strength Baseline: Goal status: New  Pt will improve FOTO to at least 60% functional to show improved function Baseline: Goal status: New  Pt will reduce pain by overall 50% overall with usual  activity Baseline: Goal status: Goal met 12/05/21  Pt will reduce pain to overall less than 2-3/10 with usual activity and work activity. Baseline:  Goal status: 11/19/2022 3/10.  Pt will improve her STS by 2.3 seconds for Minimal clinically important difference.  Baseline: Goal Status: Goal met 11/10/22  PLAN: PT FREQUENCY: 1 times per week    PT DURATION: 8 weeks   PLANNED INTERVENTIONS (unless contraindicated): aquatic PT, Canalith repositioning, cryotherapy, Electrical stimulation, Iontophoresis with 4 mg/ml dexamethasome, Moist heat, traction, Ultrasound, gait training, Therapeutic exercise, balance training, neuromuscular re-education, patient/family education, prosthetic training, manual techniques, passive ROM, dry needling, taping, vasopnuematic device, vestibular, spinal manipulations, joint manipulations   PLAN FOR NEXT SESSION: FOTO, MMT, pt reports she may want to do a few more visits to progress HEP.  Myrene Galas, PTA 12/05/22 10:10 AM

## 2022-12-08 ENCOUNTER — Ambulatory Visit (INDEPENDENT_AMBULATORY_CARE_PROVIDER_SITE_OTHER): Payer: Medicare PPO | Admitting: Family Medicine

## 2022-12-08 VITALS — BP 164/88 | HR 73 | Ht 63.0 in | Wt 180.0 lb

## 2022-12-08 DIAGNOSIS — M25462 Effusion, left knee: Secondary | ICD-10-CM | POA: Diagnosis not present

## 2022-12-08 DIAGNOSIS — M25562 Pain in left knee: Secondary | ICD-10-CM

## 2022-12-08 NOTE — Progress Notes (Signed)
   I, Peterson Lombard, LAT, ATC acting as a scribe for Lynne Leader, MD.  Virginia [name] is a 82 y.o. female who presents to [place] at [facility] today for 72-monthf/u L knee pain. Pt was last seen by Dr. CGeorgina Snellon 10/06/22 and was given a L knee steroid injection and was advised to use Voltaren gel, Tylenol, and was referred to PT, completing 7 visits. Today, pt reports L knee is feeling much better. Pt notes she has really enjoyed and benefited from PT.   Dx testing: 09/30/2022 LE left vascular ultrasound             09/26/2022 left knee x-ray  Pertinent review of systems: No fevers or chills  Relevant historical information: Hypertension, diabetes, and scoliosis with occasional sciatica.   Exam:  BP (!) 164/88   Pulse 73   Ht '5\' 3"'$  (1.6 m)   Wt 180 lb (81.6 kg)   SpO2 99%   BMI 31.89 kg/m  General: Well Developed, well nourished, and in no acute distress.   MSK: Left knee normal-appearing normal motion with crepitation.  Intact strength.    Assessment and Plan: 82y.o. female with left knee pain due to DJD.  Significant improvement with steroid injection about 2 months ago.  Additionally she has had fantastic improvement with physical therapy.  She still attending physical therapy and has a few more sessions left.  Good dedicated focused on home exercise program and quad strengthening will help prevent this pain for returning.  We do have Orthovisc authorized for use in the future if needed.  Hopefully she is not going to need it.  Check back as needed.  Total encounter time 20 minutes including face-to-face time with the patient and, reviewing past medical record, and charting on the date of service.   Discussion of treatment plan and options in the future if needed.   Discussed warning signs or symptoms. Please see discharge instructions. Patient expresses understanding.   The above documentation has been reviewed and is accurate and complete ELynne Leader M.D.

## 2022-12-08 NOTE — Telephone Encounter (Signed)
L knee Orthovisc approved. No pre-cert required.  Deductible does not apply. Once OOP has been met, pt covered at 100%.

## 2022-12-08 NOTE — Patient Instructions (Signed)
Thank you for coming in today.   Continue home exercises.   Recheck as needed.   I can do the cortisone injection in February as needed.

## 2022-12-11 DIAGNOSIS — M5441 Lumbago with sciatica, right side: Secondary | ICD-10-CM | POA: Diagnosis not present

## 2022-12-11 DIAGNOSIS — M9903 Segmental and somatic dysfunction of lumbar region: Secondary | ICD-10-CM | POA: Diagnosis not present

## 2022-12-12 ENCOUNTER — Encounter: Payer: Self-pay | Admitting: Physical Therapy

## 2022-12-12 ENCOUNTER — Ambulatory Visit: Payer: Medicare PPO | Admitting: Physical Therapy

## 2022-12-12 DIAGNOSIS — M25562 Pain in left knee: Secondary | ICD-10-CM | POA: Diagnosis not present

## 2022-12-12 DIAGNOSIS — R600 Localized edema: Secondary | ICD-10-CM | POA: Diagnosis not present

## 2022-12-12 DIAGNOSIS — G8929 Other chronic pain: Secondary | ICD-10-CM | POA: Diagnosis not present

## 2022-12-12 DIAGNOSIS — M6281 Muscle weakness (generalized): Secondary | ICD-10-CM | POA: Diagnosis not present

## 2022-12-12 DIAGNOSIS — R262 Difficulty in walking, not elsewhere classified: Secondary | ICD-10-CM | POA: Diagnosis not present

## 2022-12-12 NOTE — Therapy (Signed)
OUTPATIENT PHYSICAL THERAPY TREATMENT NOTE   Patient Name: Virginia [name] MRN: [id number] DOB:20-Feb-1941, 82 y.o., female Today's Date: 12/12/2022  PCP:  [name], MD  REFERRING PROVIDER: Gregor Hams, MD   END OF SESSION:   PT End of Session - 12/12/22 1101     Visit Number 8    Number of Visits 16    Date for PT Re-Evaluation 12/15/22    Authorization Type [employer] MEDIC[employer] CHOICE PPO    Authorization Time Period 12 visits 10/13/22-12/15/22    Authorization - Visit Number 8    Authorization - Number of Visits 12    PT Start Time 1101    PT Stop Time 1139    PT Time Calculation (min) 38 min    Activity Tolerance Patient tolerated treatment well    Behavior During Therapy WFL for tasks assessed/performed                  Past Medical History:  Diagnosis Date   ALLERGIC RHINITIS    Allergy    Anemia    Angiectasia 06/30/2019   small- in the small bowel   Anxiety    Blood transfusion without reported diagnosis    Cataract    beginning stage in lt. 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   s/p dilation   Scoliosis    Tubular adenoma of colon    Past Surgical History:  Procedure Laterality Date   child birth  23 & 67   COLONOSCOPY     ESOPHAGEAL DILATION N/A    Fibroidectomy     Low back surgery  01/1994   2 bulging disc   POLYPECTOMY     TONSILLECTOMY  1968   UPPER GASTROINTESTINAL ENDOSCOPY     Patient Active Problem List   Diagnosis Date Noted   Hypertriglyceridemia 10/28/2022   Pain and swelling of left knee 09/27/2022   Pain and swelling of left lower leg  09/27/2022   Statin intolerance 08/20/2022   Apnea 02/17/2022   Snoring 02/17/2022   Brain fog 02/17/2022   Loose stools 02/13/2021   Racing heart beat 02/13/2021   Scoliosis w/ chronic back pain, chiropractor 08/01/2020   Hypertensive retinopathy 07/25/2020   Headache 02/02/2020   Tinnitus 05/24/2019   Pain of toe of right foot 05/24/2019   Left foot pain 05/13/2017   Diabetes mellitus without complication ([place]) 32/35/5732   Constipation 04/16/2016   Bilateral thumb pain 04/16/2016   Dysphagia 10/17/2015   Osteopenia 06/21/2014   Sciatica of right side 05/12/2011   SCHATZKI'S RING 01/01/2010   Osteoarthritis 01/01/2010   FIBROIDS, UTERUS 12/13/2009   Hyperlipidemia 12/13/2009   Essential hypertension 12/13/2009   ALLERGIC RHINITIS 12/13/2009   GERD (gastroesophageal reflux disease) 12/13/2009   COLONIC POLYPS, HX OF 12/13/2009    REFERRING DIAG: Pain and swelling of left knee [M25.562, M25.462]   THERAPY DIAG:  Difficulty in walking, not elsewhere classified  Chronic pain of left knee  Muscle weakness (generalized)  Localized edema  Rationale for Evaluation and Treatment Rehabilitation  PERTINENT  HISTORY: Anxiety, DM, HTN, OA, scoliosis.    PRECAUTIONS: None  SUBJECTIVE:                                                                                                                                                                                      SUBJECTIVE STATEMENT:  Currently no pain, doing well overall.   Are you having pain? Not right now just depends on what I do.    OBJECTIVE: (objective measures completed at initial evaluation unless otherwise dated)  DIAGNOSTIC FINDINGS: Mild osteoarthritis of the left knee with small joint effusion.   PATIENT SURVEYS:  FOTO 43.3548%, 60% in 12 visits  FOTO 12/12/22 65%   COGNITION: Overall cognitive status: Within functional limits for tasks assessed                                    SENSATION: WFL    EDEMA:  Circumferential: L 18", R 18.75"      POSTURE: rounded shoulders and forward head   PALPATION: Anterior/ medial knee at joint line.    LOWER EXTREMITY ROM:   Active ROM Right eval Left eval  Hip flexion [facility]  Knee flexion [facility] WFL  Knee extension WFL WFL   (Blank rows = not tested)   LOWER EXTREMITY MMT:   MMT Right eval Left eval Left 12/12/22  Hip flexion '5 5 5  '$ Knee flexion 5 4+ 5-  Knee extension 5 4+ 5   (Blank rows = not tested)   FUNCTIONAL TESTS:  5 times sit to stand: 17.48 sec  11/10/22: 5x sit to stand 13 sec   GAIT: Distance walked: 50 ft  Assistive device utilized: None Level of assistance: Complete Independence Comments: Pt ambulates with normal gait.    TODAY'S TREATMENT:   12/12/22: Diona Foley squeeze 2x20 Red loop clamshells 2x15 LAQ 3# 2x10 Bil  MMT see above FOTO  Nustep 10 min L3 with concurrent FOTO  12/05/21: Nustep L4 x 6 min with PTA present: VC to push with quads more. Seated ball squeezes 15x Seated clamshells redloop 3x10: gave pt green band to take home to do LAQ 3# 10x Bil Standing hip abd 2# 10x Bil at [place] marching with slight lean back 3# 15x Forward step ups 10x with light UE Step downs off first step with 2 hands on rails a little more 10x Bil Standing against the wall toes raises 2x10 3 sec hold   11/19/22: Nustep L5 57mn with PT present to monitor and discuss current status Seated ball squeezes 5 sec hold 12x LAQ 2.5# & ball squeeze  2x10 Seated clamshells red loop 2x15 Standing hip abduction 1.5# Bil 2x10 Standing heel raises 20x    DATE: Creating, reviewing, and completing below HEP      PATIENT EDUCATION:  Education details: Educated pt on anatomy and physiology of current symptoms, FOTO, diagnosis, prognosis, HEP,  and POC. Person educated: Patient Education method: Customer service manager Education comprehension: verbalized understanding and returned demonstration   HOME EXERCISE  PROGRAM: Access Code: [facility] URL: [email or web address] Date: 10/13/2022 Prepared by: Rudi Heap   Exercises - Seated Long Arc Quad  - 2 x daily - 7 x weekly - 2 sets - 10 reps - Supine Hamstring Stretch with Strap  - 2 x daily - 7 x weekly - 2 sets - 10 reps - Supine Active Straight Leg Raise  - 2 x daily - 7 x weekly - 2 sets - 10 reps - Supine Bridge  - 2 x daily - 7 x weekly - 2 sets - 10 reps Added on 10/20/22: Seated ball squeezes    ASSESSMENT:   CLINICAL IMPRESSION:  Pt arrives with no knee pain and reports 75% maybe greater improvement. All goals met today but pt would like to progress her goals and speak with PT on her next visit. Pt has more approved PT visits but will need a date extension from when she was out of town.    OBJECTIVE IMPAIRMENTS: decreased activity tolerance, difficulty walking, decreased balance, decreased endurance, decreased mobility, decreased ROM, decreased strength, impaired flexibility, impaired UE/LE use, postural dysfunction, and pain.   ACTIVITY LIMITATIONS: bending, lifting, carry, locomotion, cleaning, community activity, driving, and or occupation   PERSONAL FACTORS: Anxiety, DM, HTN, OA, scoliosis are also affecting patient's functional outcome.   REHAB POTENTIAL: Good   CLINICAL DECISION MAKING: Stable/uncomplicated   EVALUATION COMPLEXITY: Low       GOALS: Short term PT Goals Target date: 11/10/2022  Pt will be I and compliant with HEP. Baseline: doing HEP (10/27/22) Goal status: Goal met 11/03/22  Pt will decrease pain by 25% overall Baseline: Goal status: Goal met 11/03/22 25% but no greater.   Long term PT goals Target date: 12/08/2022  Pt will improve  R knee strength to at least 5-/5 MMT to improve functional strength Baseline: Goal status: New  Pt will improve FOTO to at least 60% functional to show improved function Baseline: Goal status: Goal met 65% 12/12/22  Pt will reduce pain by overall 50%  overall with usual activity Baseline: Goal status: Goal met 12/05/21  Pt will reduce pain to overall less than 2-3/10 with usual activity and work activity. Baseline:  Goal status: 11/19/2022 3/10.  Pt will improve her STS by 2.3 seconds for Minimal clinically important difference.  Baseline: Goal Status: Goal met 11/10/22  PLAN: PT FREQUENCY: 1 times per week    PT DURATION: 8 weeks   PLANNED INTERVENTIONS (unless contraindicated): aquatic PT, Canalith repositioning, cryotherapy, Electrical stimulation, Iontophoresis with 4 mg/ml dexamethasome, Moist heat, traction, Ultrasound, gait training, Therapeutic exercise, balance training, neuromuscular re-education, patient/family education, prosthetic training, manual techniques, passive ROM, dry needling, taping, vasopnuematic device, vestibular, spinal manipulations, joint manipulations   PLAN FOR NEXT SESSION:  Pt would like to continue 1x week for 2-4 more weeks. Pt POC will require a date extension.   Myrene Galas, PTA 12/12/22 11:41 AM

## 2022-12-15 ENCOUNTER — Encounter: Payer: Self-pay | Admitting: Physical Therapy

## 2022-12-15 ENCOUNTER — Ambulatory Visit: Payer: Medicare PPO | Admitting: Physical Therapy

## 2022-12-15 DIAGNOSIS — R262 Difficulty in walking, not elsewhere classified: Secondary | ICD-10-CM

## 2022-12-15 DIAGNOSIS — G8929 Other chronic pain: Secondary | ICD-10-CM | POA: Diagnosis not present

## 2022-12-15 DIAGNOSIS — M25562 Pain in left knee: Secondary | ICD-10-CM | POA: Diagnosis not present

## 2022-12-15 DIAGNOSIS — R600 Localized edema: Secondary | ICD-10-CM | POA: Diagnosis not present

## 2022-12-15 DIAGNOSIS — M6281 Muscle weakness (generalized): Secondary | ICD-10-CM

## 2022-12-15 NOTE — Therapy (Signed)
OUTPATIENT PHYSICAL THERAPY TREATMENT NOTE  Progress Note  Reporting Period 10/13/2022 to 12/15/2022  See note below for Objective Data and Assessment of Progress/Goals.    Patient Name: Virginia [name] MRN: [id number] DOB:1941-05-06, 82 y.o., female Today's Date: 12/15/2022  PCP:  [name], MD  REFERRING PROVIDER: Gregor Hams, MD   END OF SESSION:          Past Medical History:  Diagnosis Date   ALLERGIC RHINITIS    Allergy    Anemia    Angiectasia 06/30/2019   small- in the small bowel   Anxiety    Blood transfusion without reported diagnosis    Cataract    beginning stage in lt. eye   Chronic kidney disease    Diabetes ([place]) 08/2022   Diverticulosis    FIBROIDS, UTERUS    s/p fibroidectomy   GERD    HYPERLIPIDEMIA    intol of statins (myalgia, thumb pain)   HYPERTENSION    Intestinal ulcer 06/30/2019   2 large ulcerations in the mid to distal small bowel (seen on capsule endoscopy)   OSTEOARTHRITIS    Osteopenia 06/21/2014   DEXA'@LB'$  06/14/14: -1.6   SCHATZKI'S RING 11/1999, 08/2009   s/p dilation   Scoliosis    Tubular adenoma of colon    Past Surgical History:  Procedure Laterality Date   child birth  26 & 78   COLONOSCOPY     ESOPHAGEAL DILATION N/A    Fibroidectomy     Low back surgery  01/1994   2 bulging disc   POLYPECTOMY     TONSILLECTOMY  1968   UPPER GASTROINTESTINAL ENDOSCOPY     Patient Active Problem List   Diagnosis Date Noted   Hypertriglyceridemia 10/28/2022   Pain and swelling of left knee 09/27/2022   Pain and swelling of left lower leg 09/27/2022   Statin intolerance 08/20/2022   Apnea 02/17/2022   Snoring 02/17/2022   Brain fog 02/17/2022   Loose stools 02/13/2021   Racing heart beat 02/13/2021   Scoliosis w/ chronic back pain, chiropractor 08/01/2020   Hypertensive retinopathy 07/25/2020   Headache 02/02/2020   Tinnitus 05/24/2019   Pain of toe of right foot 05/24/2019   Left foot pain 05/13/2017    Diabetes mellitus without complication ([place]) 38/93/7342   Constipation 04/16/2016   Bilateral thumb pain 04/16/2016   Dysphagia 10/17/2015   Osteopenia 06/21/2014   Sciatica of right side 05/12/2011   SCHATZKI'S RING 01/01/2010   Osteoarthritis 01/01/2010   FIBROIDS, UTERUS 12/13/2009   Hyperlipidemia 12/13/2009   Essential hypertension 12/13/2009   ALLERGIC RHINITIS 12/13/2009   GERD (gastroesophageal reflux disease) 12/13/2009   COLONIC POLYPS, HX OF 12/13/2009    REFERRING DIAG: Pain and swelling of left knee [M25.562, M25.462]   THERAPY DIAG:  No diagnosis found.  Rationale for Evaluation and Treatment Rehabilitation  PERTINENT HISTORY: Anxiety, DM, HTN, OA, scoliosis.    PRECAUTIONS: None  SUBJECTIVE:  SUBJECTIVE STATEMENT:  Pt reports 80% improvement since she started PT. She is now able to ascend/ descend stairs with reduced pain in her L knee. She is able to get in/ out of her car, but continues to have mild difficulty when getting off low toilet seats. She reports fatigue with standing for increased periods of time.   Are you having pain? Not right now just depends on what I do.    OBJECTIVE: (objective measures completed at initial evaluation unless otherwise dated)  DIAGNOSTIC FINDINGS: Mild osteoarthritis of the left knee with small joint effusion.   PATIENT SURVEYS:  FOTO 43.3548%, 60% in 12 visits  FOTO 12/12/22 65%    COGNITION: Overall cognitive status: Within functional limits for tasks assessed                                    SENSATION: WFL   EDEMA:  Circumferential: L 18", R 18.75"      POSTURE: rounded shoulders and forward head   PALPATION: Anterior/ medial knee at joint line.    LOWER EXTREMITY ROM:   Active ROM Right eval Left eval  Hip flexion [facility]   Knee flexion [facility] WFL  Knee extension WFL WFL   (Blank rows = not tested)   LOWER EXTREMITY MMT:   MMT Right eval Left eval Left 12/12/22  Hip flexion '5 5 5  '$ Knee flexion 5 4+ 5-  Knee extension 5 4+ 5   (Blank rows = not tested)   FUNCTIONAL TESTS:  5 times sit to stand: 17.48 sec  11/10/22: 5x sit to stand 13 sec   GAIT: Distance walked: 50 ft  Assistive device utilized: None Level of assistance: Complete Independence Comments: Pt ambulates with normal gait.    TODAY'S TREATMENT:  12/15/2022: Nustep 10 min L5, reviewed goals.  Red loop clamshells 2x15 LAQ 3# 2x10 Bil  Ball squeeze 2x20 Seated marches with #3  Sit to stand with airex below feet x15  Step up in forward and lateral directions each LE x10 each at stairs.   12/12/22: Ball squeeze 2x20 Red loop clamshells 2x15 LAQ 3# 2x10 Bil  MMT see above FOTO  Nustep 10 min L3 with concurrent FOTO  12/05/21: Nustep L4 x 6 min with PTA present: VC to push with quads more. Seated ball squeezes 15x Seated clamshells redloop 3x10: gave pt green band to take home to do LAQ 3# 10x Bil Standing hip abd 2# 10x Bil at [employer] with slight lean back 3# 15x Forward step ups 10x with light UE Step downs off first step with 2 hands on rails a little more 10x Bil Standing against the wall toes raises 2x10 3 sec hold   11/19/22: Nustep L5 34mn with PT present to monitor and discuss current status Seated ball squeezes 5 sec hold 12x LAQ 2.5# & ball squeeze 2x10 Seated clamshells red loop 2x15 Standing hip abduction 1.5# Bil 2x10 Standing heel raises 20x    DATE: Creating, reviewing, and completing below HEP      PATIENT EDUCATION:  Education details: Educated pt on anatomy and physiology of current symptoms, FOTO, diagnosis, prognosis, HEP,  and POC. Person educated: Patient Education method: ECustomer service managerEducation comprehension: verbalized understanding and returned demonstration    HOME EXERCISE PROGRAM: [facility]: [email or web address] Date: 10/13/2022 Prepared by: SRudi Heap  Exercises - Seated Long Arc Quad  - 2 x  daily - 7 x weekly - 2 sets - 10 reps - Supine Hamstring Stretch with Strap  - 2 x daily - 7 x weekly - 2 sets - 10 reps - Supine Active Straight Leg Raise  - 2 x daily - 7 x weekly - 2 sets - 10 reps - Supine Bridge  - 2 x daily - 7 x weekly - 2 sets - 10 reps Added on 10/20/22: Seated ball squeezes    ASSESSMENT:   CLINICAL IMPRESSION:  Pt reports 80% improvements since starting PT. She has met all her functional goals, but would like to finish her last 3 sessions to review a comprehensive HEP. Plan to continue with strengthening and reviewing HEP to ensure proper forum prior to D/C.    OBJECTIVE IMPAIRMENTS: decreased activity tolerance, difficulty walking, decreased balance, decreased endurance, decreased mobility, decreased ROM, decreased strength, impaired flexibility, impaired UE/LE use, postural dysfunction, and pain.   ACTIVITY LIMITATIONS: bending, lifting, carry, locomotion, cleaning, community activity, driving, and or occupation   PERSONAL FACTORS: Anxiety, DM, HTN, OA, scoliosis are also affecting patient's functional outcome.   REHAB POTENTIAL: Good   CLINICAL DECISION MAKING: Stable/uncomplicated   EVALUATION COMPLEXITY: Low       GOALS: Short term PT Goals Target date: 11/10/2022  Pt will be I and compliant with HEP. Baseline: doing HEP (10/27/22) Goal status: Goal met 11/03/22  Pt will decrease pain by 25% overall Baseline: Goal status: Goal met 11/03/22 25% but no greater.   Long term PT goals Target date: 12/08/2022  Pt will improve  R knee strength to at least 5-/5 MMT to improve functional strength Baseline: Goal status: Met 12/12/2022  Pt will improve FOTO to at least 60% functional to show improved function Baseline: Goal status: Goal met 65% 12/12/22  Pt will reduce pain by  overall 50% overall with usual activity Baseline: Goal status: Goal met 12/05/21  Pt will reduce pain to overall less than 2-3/10 with usual activity and work activity. Baseline:  Goal status: MET intermittent 2/10, 12/15/2022  Pt will improve her STS by 2.3 seconds for Minimal clinically important difference.  Baseline: Goal Status: Goal met 11/10/22  PLAN: PT FREQUENCY: 1 times per week    PT DURATION: 8 weeks   PLANNED INTERVENTIONS (unless contraindicated): aquatic PT, Canalith repositioning, cryotherapy, Electrical stimulation, Iontophoresis with 4 mg/ml dexamethasome, Moist heat, traction, Ultrasound, gait training, Therapeutic exercise, balance training, neuromuscular re-education, patient/family education, prosthetic training, manual techniques, passive ROM, dry needling, taping, vasopnuematic device, vestibular, spinal manipulations, joint manipulations   PLAN FOR NEXT SESSION: 3 more sessions then d/c to comprehensive HEP.   Rudi Heap PT, DPT 12/15/22  8:48 AM

## 2022-12-16 NOTE — Addendum Note (Signed)
Addended by: Lynden Ang on: 12/16/2022 10:11 AM   Modules accepted: Orders

## 2022-12-22 ENCOUNTER — Encounter: Payer: Self-pay | Admitting: Physical Therapy

## 2022-12-22 ENCOUNTER — Ambulatory Visit: Payer: Medicare PPO | Admitting: Physical Therapy

## 2022-12-22 DIAGNOSIS — M6281 Muscle weakness (generalized): Secondary | ICD-10-CM | POA: Diagnosis not present

## 2022-12-22 DIAGNOSIS — G8929 Other chronic pain: Secondary | ICD-10-CM | POA: Diagnosis not present

## 2022-12-22 DIAGNOSIS — R262 Difficulty in walking, not elsewhere classified: Secondary | ICD-10-CM

## 2022-12-22 DIAGNOSIS — R600 Localized edema: Secondary | ICD-10-CM | POA: Diagnosis not present

## 2022-12-22 DIAGNOSIS — M25562 Pain in left knee: Secondary | ICD-10-CM | POA: Diagnosis not present

## 2022-12-22 NOTE — Therapy (Addendum)
OUTPATIENT PHYSICAL THERAPY TREATMENT NOTE    Patient Name: Virginia [name] MRN: [id number] DOB:[birth year], 82 y.o., female Today's Date: 12/22/2022  PCP:  [name], MD  REFERRING PROVIDER: Gregor Hams, MD   END OF SESSION:   PT End of Session - 12/22/22 1447     Visit Number 10    Number of Visits 16    Date for PT Re-Evaluation 01/01/23    Authorization Type [employer] MEDICARE CHOICE PPO    Authorization Time Period 3 visits through 01/01/23    Authorization - Visit Number 1    Authorization - Number of Visits 3    PT Start Time 1446    PT Stop Time 1522    PT Time Calculation (min) 36 min    Activity Tolerance Patient tolerated treatment well    Behavior During Therapy WFL for tasks assessed/performed                   Past Medical History:  Diagnosis Date   ALLERGIC RHINITIS    Allergy    Anemia    Angiectasia 06/30/2019   small- in the small bowel   Anxiety    Blood transfusion without reported diagnosis    Cataract    beginning stage in lt. eye   Chronic kidney disease    Diabetes ([place]) 08/2022   Diverticulosis    FIBROIDS, UTERUS    s/p fibroidectomy   GERD    HYPERLIPIDEMIA    intol of statins (myalgia, thumb pain)   HYPERTENSION    Intestinal ulcer 06/30/2019   2 large ulcerations in the mid to distal small bowel (seen on capsule endoscopy)   OSTEOARTHRITIS    Osteopenia 06/21/2014   DEXA@LB$  06/14/14: -1.6   SCHATZKI'S RING 11/1999, 08/2009   s/p dilation   Scoliosis    Tubular adenoma of colon    Past Surgical History:  Procedure Laterality Date   child birth  37 & 44   COLONOSCOPY     ESOPHAGEAL DILATION N/A    Fibroidectomy     Low back surgery  01/1994   2 bulging disc   POLYPECTOMY     TONSILLECTOMY  1968   UPPER GASTROINTESTINAL ENDOSCOPY     Patient Active Problem List   Diagnosis Date Noted   Hypertriglyceridemia 10/28/2022   Pain and swelling of left knee 09/27/2022   Pain and swelling of left lower leg  09/27/2022   Statin intolerance 08/20/2022   Apnea 02/17/2022   Snoring 02/17/2022   Brain fog 02/17/2022   Loose stools 02/13/2021   Racing heart beat 02/13/2021   Scoliosis w/ chronic back pain, chiropractor 08/01/2020   Hypertensive retinopathy 07/25/2020   Headache 02/02/2020   Tinnitus 05/24/2019   Pain of toe of right foot 05/24/2019   Left foot pain 05/13/2017   Diabetes mellitus without complication ([place]) [id number]   Constipation 04/16/2016   Bilateral thumb pain 04/16/2016   Dysphagia 10/17/2015   Osteopenia 06/21/2014   Sciatica of right side 05/12/2011   SCHATZKI'S RING 01/01/2010   Osteoarthritis 01/01/2010   FIBROIDS, UTERUS 12/13/2009   Hyperlipidemia 12/13/2009   Essential hypertension 12/13/2009   ALLERGIC RHINITIS 12/13/2009   GERD (gastroesophageal reflux disease) 12/13/2009   COLONIC POLYPS, HX OF 12/13/2009    REFERRING DIAG: Pain and swelling of left knee [M25.562, M25.462]   THERAPY DIAG:  Difficulty in walking, not elsewhere classified  Muscle weakness (generalized)  Chronic pain of left knee  Localized edema  Rationale for Evaluation and Treatment  Rehabilitation  PERTINENT HISTORY: Anxiety, DM, HTN, OA, scoliosis.    PRECAUTIONS: None  SUBJECTIVE:                                                                                                                                                                                      SUBJECTIVE STATEMENT: Continues to do well. The bands are working well at home instead of buying ankle weights.   Are you having pain? Not right now just depends on what I do.    OBJECTIVE: (objective measures completed at initial evaluation unless otherwise dated)  DIAGNOSTIC FINDINGS: Mild osteoarthritis of the left knee with small joint effusion.   PATIENT SURVEYS:  FOTO 43.3548%, 60% in 12 visits  FOTO 12/12/22 65%    COGNITION: Overall cognitive status: Within functional limits for tasks assessed                                     SENSATION: WFL   EDEMA:  Circumferential: L 18", R 18.75"      POSTURE: rounded shoulders and forward head   PALPATION: Anterior/ medial knee at joint line.    LOWER EXTREMITY ROM:   Active ROM Right eval Left eval  Hip flexion [facility]  Knee flexion [facility] WFL  Knee extension WFL WFL   (Blank rows = not tested)   LOWER EXTREMITY MMT:   MMT Right eval Left eval Left 12/12/22  Hip flexion 5 5 5  $ Knee flexion 5 4+ 5-  Knee extension 5 4+ 5   (Blank rows = not tested)   FUNCTIONAL TESTS:  5 times sit to stand: 17.48 sec  11/10/22: 5x sit to stand 13 sec   GAIT: Distance walked: 50 ft  Assistive device utilized: None Level of assistance: Complete Independence Comments: Pt ambulates with normal gait.    TODAY'S TREATMENT:   12/22/22: Nustep 10 min L4 old model, PTA present to discuss status. Progressed seated clamshells to blue color: issued Blue band for home 3x10 Reviewed red band LAQ at home. Verbal instruction in making a full ROM when doing and not just TKE. Adductor squeeze: increased hold time to 5 sec (true 5 sec) 10x, will hold longer at home.  Standing side stepping with red band above knees 4 lengths of counter top.   12/15/2022: Nustep 10 min L5, reviewed goals.  Red loop clamshells 2x15 LAQ 3# 2x10 Bil  Ball squeeze 2x20 Seated marches with #3  Sit to stand with airex below feet x15  Step up in forward and lateral directions each LE x10 each at stairs.  12/12/22: Ball squeeze 2x20 Red loop clamshells 2x15 LAQ 3# 2x10 Bil  MMT see above FOTO  Nustep 10 min L3 with concurrent FOTO    PATIENT EDUCATION:  Education details: Educated pt on anatomy and physiology of current symptoms, FOTO, diagnosis, prognosis, HEP,  and POC. Person educated: Patient Education method: Customer service manager Education comprehension: verbalized understanding and returned demonstration   HOME EXERCISE PROGRAM: Access Code:  [facility] URL: [email or web address] Date: 10/13/2022 Prepared by: Rudi Heap   Exercises - Seated Long Arc Quad  - 2 x daily - 7 x weekly - 2 sets - 10 reps - Supine Hamstring Stretch with Strap  - 2 x daily - 7 x weekly - 2 sets - 10 reps - Supine Active Straight Leg Raise  - 2 x daily - 7 x weekly - 2 sets - 10 reps - Supine Bridge  - 2 x daily - 7 x weekly - 2 sets - 10 reps Added on 10/20/22: Seated ball squeezes Added on 12/22/22: Resisted side stepping at countertop with band    ASSESSMENT:   CLINICAL IMPRESSION:  Increased resistance for home exercises/HEP progression and gave pt the bands to take home. Encouraged pt to think about returning to the [facility] to use th eNustep since she has been so successful using that machine in PT.   OBJECTIVE IMPAIRMENTS: decreased activity tolerance, difficulty walking, decreased balance, decreased endurance, decreased mobility, decreased ROM, decreased strength, impaired flexibility, impaired UE/LE use, postural dysfunction, and pain.   ACTIVITY LIMITATIONS: bending, lifting, carry, locomotion, cleaning, community activity, driving, and or occupation   PERSONAL FACTORS: Anxiety, DM, HTN, OA, scoliosis are also affecting patient's functional outcome.   REHAB POTENTIAL: Good   CLINICAL DECISION MAKING: Stable/uncomplicated   EVALUATION COMPLEXITY: Low       GOALS: Short term PT Goals Target date: 11/10/2022  Pt will be I and compliant with HEP. Baseline: doing HEP (10/27/22) Goal status: Goal met 11/03/22  Pt will decrease pain by 25% overall Baseline: Goal status: Goal met 11/03/22 25% but no greater.   Long term PT goals Target date: 12/08/2022  Pt will improve  R knee strength to at least 5-/5 MMT to improve functional strength Baseline: Goal status: Met 12/12/2022  Pt will improve FOTO to at least 60% functional to show improved function Baseline: Goal status: Goal met 65% 12/12/22  Pt will reduce pain by overall  50% overall with usual activity Baseline: Goal status: Goal met 12/05/21  Pt will reduce pain to overall less than 2-3/10 with usual activity and work activity. Baseline:  Goal status: MET intermittent 2/10, 12/15/2022  Pt will improve her STS by 2.3 seconds for Minimal clinically important difference.  Baseline: Goal Status: Goal met 11/10/22  PLAN: PT FREQUENCY: 1 times per week    PT DURATION: 8 weeks   PLANNED INTERVENTIONS (unless contraindicated): aquatic PT, Canalith repositioning, cryotherapy, Electrical stimulation, Iontophoresis with 4 mg/ml dexamethasome, Moist heat, traction, Ultrasound, gait training, Therapeutic exercise, balance training, neuromuscular re-education, patient/family education, prosthetic training, manual techniques, passive ROM, dry needling, taping, vasopnuematic device, vestibular, spinal manipulations, joint manipulations   PLAN FOR NEXT SESSION: 1-2 more sessions then d/c to comprehensive HEP. Pt may only be able to get 1x more in due to her schedule.   Myrene Galas, PTA 12/22/22 3:21 PM  PHYSICAL THERAPY DISCHARGE SUMMARY  Visits from Start of Care: 10  Current functional level related to goals / functional outcomes: See above for most current PT status.    Remaining deficits:  See above    Education / Equipment: HEP   Patient agrees to discharge. Patient goals were met. Patient is being discharged due to being pleased with the current functional level.  Sigurd Sos, PT 01/22/23 5:00 PM

## 2022-12-23 ENCOUNTER — Other Ambulatory Visit: Payer: Self-pay | Admitting: Internal Medicine

## 2022-12-30 ENCOUNTER — Ambulatory Visit: Payer: Medicare PPO | Admitting: Physical Therapy

## 2023-01-01 DIAGNOSIS — M5441 Lumbago with sciatica, right side: Secondary | ICD-10-CM | POA: Diagnosis not present

## 2023-01-01 DIAGNOSIS — M9903 Segmental and somatic dysfunction of lumbar region: Secondary | ICD-10-CM | POA: Diagnosis not present

## 2023-01-08 ENCOUNTER — Telehealth: Payer: Self-pay | Admitting: Internal Medicine

## 2023-01-08 MED ORDER — FENOFIBRATE 145 MG PO TABS: 145.0000 mg | ORAL_TABLET | Freq: Every day | ORAL | 5 refills | Status: DC

## 2023-01-08 NOTE — Telephone Encounter (Signed)
Patient states she received a letter from [facility] stating her icosapent ethyl 1g capsule is not covered, so she would like a prescription for a different medication that will do the same thing.

## 2023-01-08 NOTE — Telephone Encounter (Signed)
Message left for patient today that script had been sent in.

## 2023-01-08 NOTE — Telephone Encounter (Signed)
New rx sent - fenofibrate.

## 2023-01-15 ENCOUNTER — Ambulatory Visit: Payer: Medicare PPO | Admitting: Internal Medicine

## 2023-01-15 ENCOUNTER — Encounter: Payer: Self-pay | Admitting: Internal Medicine

## 2023-01-15 VITALS — BP 128/64 | HR 82 | Temp 97.8°F | Ht 63.0 in | Wt 176.0 lb

## 2023-01-15 DIAGNOSIS — E119 Type 2 diabetes mellitus without complications: Secondary | ICD-10-CM

## 2023-01-15 DIAGNOSIS — R350 Frequency of micturition: Secondary | ICD-10-CM | POA: Diagnosis not present

## 2023-01-15 LAB — POCT URINALYSIS DIPSTICK
Bilirubin, UA: NEGATIVE
Blood, UA: POSITIVE
Glucose, UA: NEGATIVE
Ketones, UA: NEGATIVE
Nitrite, UA: POSITIVE
Protein, UA: POSITIVE — AB
Spec Grav, UA: 1.025 (ref 1.010–1.025)
Urobilinogen, UA: NEGATIVE E.U./dL — AB
pH, UA: 6 (ref 5.0–8.0)

## 2023-01-15 MED ORDER — NITROFURANTOIN MONOHYD MACRO 100 MG PO CAPS: 100.0000 mg | ORAL_CAPSULE | Freq: Two times a day (BID) | ORAL | 0 refills | Status: AC

## 2023-01-15 NOTE — Assessment & Plan Note (Signed)
Patient did request dietary nutritionist information during visit so she was counseled about basic tenets of diabetes diet.

## 2023-01-15 NOTE — Assessment & Plan Note (Signed)
POC U/A done at office consistent with acute cystitis. Rx macrobid 5 day course.

## 2023-01-15 NOTE — Patient Instructions (Signed)
We have sent in macrobid to take 1 pill twice a day for 5 days.

## 2023-01-15 NOTE — Progress Notes (Signed)
   Subjective:   Patient ID: Virginia [name], female    DOB: 18-Mar-1941, 82 y.o.   MRN: [id number]  HPI The patient is an 82 YO female coming in for possible UTI.   Review of Systems  Constitutional: Negative.   Respiratory: Negative.    Cardiovascular: Negative.   Gastrointestinal:  Positive for abdominal pain. Negative for abdominal distention, constipation, diarrhea, nausea and vomiting.  Genitourinary:  Positive for dysuria, frequency and urgency.  Musculoskeletal: Negative.   Skin: Negative.     Objective:  Physical Exam Constitutional:      Appearance: She is well-developed.  HENT:     Head: Normocephalic and atraumatic.  Cardiovascular:     Rate and Rhythm: Normal rate and regular rhythm.  Pulmonary:     Effort: Pulmonary effort is normal. No respiratory distress.     Breath sounds: Normal breath sounds. No wheezing or rales.  Abdominal:     General: Bowel sounds are normal. There is no distension.     Palpations: Abdomen is soft.     Tenderness: There is no abdominal tenderness. There is no rebound.  Musculoskeletal:     Cervical back: Normal range of motion.  Skin:    General: Skin is warm and dry.  Neurological:     Mental Status: She is alert and oriented to person, place, and time.     Coordination: Coordination normal.     Vitals:   01/15/23 1117  BP: 128/64  Pulse: 82  Temp: 97.8 F (36.6 C)  TempSrc: Oral  SpO2: 98%  Weight: 176 lb (79.8 kg)  Height: '5\' 3"'$  (1.6 m)    Assessment & Plan:

## 2023-01-21 DIAGNOSIS — M5441 Lumbago with sciatica, right side: Secondary | ICD-10-CM | POA: Diagnosis not present

## 2023-01-21 DIAGNOSIS — M9903 Segmental and somatic dysfunction of lumbar region: Secondary | ICD-10-CM | POA: Diagnosis not present

## 2023-01-23 ENCOUNTER — Other Ambulatory Visit: Payer: Self-pay | Admitting: Internal Medicine

## 2023-01-23 ENCOUNTER — Other Ambulatory Visit: Payer: Self-pay

## 2023-01-25 ENCOUNTER — Encounter: Payer: Self-pay | Admitting: Internal Medicine

## 2023-01-25 NOTE — Progress Notes (Unsigned)
    Subjective:    Patient ID: Virginia [name], female    DOB: [birth year], 82 y.o.   MRN: [id number]      HPI Virginia [name] is here for No chief complaint on file.    UTI symptoms  - was seen 2/15 for freq urination, dysuria, urinary urgency and abdominal pain -- urine dip c/w UTI - treated with macrobid x 5 days. No culture sent.  She is still having symptoms.       Keflex ok per 2014      Medications and allergies reviewed with patient and updated if appropriate.  Current Outpatient Medications on File Prior to Visit  Medication Sig Dispense Refill   Cholecalciferol (D3 HIGH POTENCY) 50 MCG (2000 UT) CAPS Take by mouth daily.     ezetimibe (ZETIA) 10 MG tablet Take 1 tablet (10 mg total) by mouth daily. 90 tablet 3   fenofibrate (TRICOR) 145 MG tablet Take 1 tablet (145 mg total) by mouth daily. (Patient not taking: Reported on 01/15/2023) 30 tablet 5   Loratadine (KS ALLERCLEAR PO) Take 1 tablet by mouth daily.     metoprolol tartrate (LOPRESSOR) 50 MG tablet TAKE 1 TABLET BY MOUTH TWICE A DAY 180 tablet 1   Multiple Vitamins-Minerals (AIRBORNE GUMMIES PO) Take 2 each by mouth daily.     Omega-3 Fatty Acids (FISH OIL) 1000 MG CAPS Take 1,000 mg by mouth daily. 2000 mg in the morning and 1000 mg at night     pantoprazole (PROTONIX) 40 MG tablet TAKE 1 TABLET (40 MG TOTAL) BY MOUTH DAILY. **PLEASE CALL THE OFFICE TO SCHEDULE APPOINTMENT 90 tablet 3   RYBELSUS 3 MG TABS TAKE 1 TABLET(3 MG) BY MOUTH DAILY. 30 tablet 0   No current facility-administered medications on file prior to visit.    Review of Systems     Objective:  There were no vitals filed for this visit. BP Readings from Last 3 Encounters:  01/15/23 128/64  12/08/22 (!) 164/88  10/06/22 (!) 156/88   Wt Readings from Last 3 Encounters:  01/15/23 176 lb (79.8 kg)  12/08/22 180 lb (81.6 kg)  10/06/22 182 lb (82.6 kg)   There is no height or weight on file to calculate BMI.    Physical Exam          Assessment & Plan:    See Problem List for Assessment and Plan of chronic medical problems.

## 2023-01-25 NOTE — Patient Instructions (Addendum)
      Medications changes include :         No follow-ups on file.

## 2023-01-26 ENCOUNTER — Ambulatory Visit: Payer: Medicare PPO | Admitting: Internal Medicine

## 2023-01-26 ENCOUNTER — Other Ambulatory Visit: Payer: Self-pay

## 2023-01-26 VITALS — BP 130/84 | HR 80 | Temp 98.1°F | Ht 63.0 in | Wt 177.0 lb

## 2023-01-26 DIAGNOSIS — I10 Essential (primary) hypertension: Secondary | ICD-10-CM

## 2023-01-26 DIAGNOSIS — R300 Dysuria: Secondary | ICD-10-CM

## 2023-01-26 DIAGNOSIS — E119 Type 2 diabetes mellitus without complications: Secondary | ICD-10-CM

## 2023-01-26 DIAGNOSIS — N3000 Acute cystitis without hematuria: Secondary | ICD-10-CM | POA: Insufficient documentation

## 2023-01-26 LAB — POC URINALSYSI DIPSTICK (AUTOMATED)
Bilirubin, UA: NEGATIVE
Glucose, UA: NEGATIVE
Ketones, UA: NEGATIVE
Nitrite, UA: NEGATIVE
Protein, UA: POSITIVE — AB
Spec Grav, UA: 1.025 (ref 1.010–1.025)
Urobilinogen, UA: 0.2 E.U./dL
pH, UA: 6 (ref 5.0–8.0)

## 2023-01-26 MED ORDER — CEPHALEXIN 500 MG PO CAPS: 500.0000 mg | ORAL_CAPSULE | Freq: Two times a day (BID) | ORAL | 0 refills | Status: DC

## 2023-01-26 NOTE — Assessment & Plan Note (Signed)
Chronic Blood pressure adequately controlled Continue metoprolol 50 mg twice daily

## 2023-01-26 NOTE — Assessment & Plan Note (Signed)
Chronic Lab Results  Component Value Date   HGBA1C 7.1 (H) 08/20/2022   Sugars lately not controlled, but since her last blood work she did start Rybelsus 3 mg daily which she is doing well with She was concerned that one of her medications or sugars could be responsible for the UTI, which is not likely Discussed the importance of good sugar control and that the sugars are uncontrolled and may increase the risk of UTI, but not likely where she is at no Continue Rybelsus 3 mg daily Will discuss next month about possibly increasing this versus continuing same dose

## 2023-01-26 NOTE — Assessment & Plan Note (Addendum)
Acute Urine dip consistent with UTI Most likely the antibiotic that she took a couple weeks ago did not completely treat the infection Will send urine for culture Take the antibiotic as prescribed.  Keflex 500 mg bid x 1 week Take tylenol if needed.   Increase your water intake.  Call if no improvement

## 2023-01-29 LAB — CULTURE, URINE COMPREHENSIVE

## 2023-01-29 MED ORDER — SULFAMETHOXAZOLE-TRIMETHOPRIM 800-160 MG PO TABS: 1.0000 | ORAL_TABLET | Freq: Two times a day (BID) | ORAL | 0 refills | Status: DC

## 2023-01-29 NOTE — Addendum Note (Signed)
Addended by: [name] on: 01/29/2023 10:14 AM   Modules accepted: Orders

## 2023-01-30 ENCOUNTER — Encounter: Payer: Self-pay | Admitting: Internal Medicine

## 2023-01-30 MED ORDER — CIPROFLOXACIN HCL 250 MG PO TABS: 250.0000 mg | ORAL_TABLET | Freq: Two times a day (BID) | ORAL | 0 refills | Status: AC

## 2023-02-11 DIAGNOSIS — M9903 Segmental and somatic dysfunction of lumbar region: Secondary | ICD-10-CM | POA: Diagnosis not present

## 2023-02-11 DIAGNOSIS — M5441 Lumbago with sciatica, right side: Secondary | ICD-10-CM | POA: Diagnosis not present

## 2023-02-17 ENCOUNTER — Encounter: Payer: Self-pay | Admitting: Internal Medicine

## 2023-02-17 NOTE — Patient Instructions (Addendum)
      Blood work was ordered.   The lab is on the first floor.    Medications changes include :   none     Return in about 6 months (around 08/21/2023) for Physical Exam.

## 2023-02-17 NOTE — Progress Notes (Unsigned)
      Subjective:    Patient ID: Virginia [name], female    DOB: 08-27-1941, 82 y.o.   MRN: [id number]     HPI Virginia [name] is here for follow up of her chronic medical problems.  Inc rybelsus.d   Trigs?  Medications and allergies reviewed with patient and updated if appropriate.  Current Outpatient Medications on File Prior to Visit  Medication Sig Dispense Refill   cephALEXin (KEFLEX) 500 MG capsule Take 1 capsule (500 mg total) by mouth 2 (two) times daily. 14 capsule 0   Cholecalciferol (D3 HIGH POTENCY) 50 MCG (2000 UT) CAPS Take by mouth daily.     ezetimibe (ZETIA) 10 MG tablet Take 1 tablet (10 mg total) by mouth daily. 90 tablet 3   fenofibrate (TRICOR) 145 MG tablet Take 1 tablet (145 mg total) by mouth daily. (Patient not taking: Reported on 01/15/2023) 30 tablet 5   Loratadine (KS ALLERCLEAR PO) Take 1 tablet by mouth daily.     metoprolol tartrate (LOPRESSOR) 50 MG tablet TAKE 1 TABLET BY MOUTH TWICE A DAY 180 tablet 1   Multiple Vitamins-Minerals (AIRBORNE GUMMIES PO) Take 2 each by mouth daily.     Omega-3 Fatty Acids (FISH OIL) 1000 MG CAPS Take 1,000 mg by mouth daily. 2000 mg in the morning and 1000 mg at night     pantoprazole (PROTONIX) 40 MG tablet TAKE 1 TABLET (40 MG TOTAL) BY MOUTH DAILY. **PLEASE CALL THE OFFICE TO SCHEDULE APPOINTMENT 90 tablet 3   RYBELSUS 3 MG TABS TAKE 1 TABLET(3 MG) BY MOUTH DAILY. 30 tablet 0   No current facility-administered medications on file prior to visit.     Review of Systems     Objective:  There were no vitals filed for this visit. BP Readings from Last 3 Encounters:  01/26/23 130/84  01/15/23 128/64  12/08/22 (!) 164/88   Wt Readings from Last 3 Encounters:  01/26/23 177 lb (80.3 kg)  01/15/23 176 lb (79.8 kg)  12/08/22 180 lb (81.6 kg)   There is no height or weight on file to calculate BMI.    Physical Exam     Lab Results  Component Value Date   WBC 6.7 08/20/2022   HGB 15.2 (H) 08/20/2022   HCT  45.3 08/20/2022   PLT 175.0 08/20/2022   GLUCOSE 102 (H) 08/20/2022   CHOL 247 (H) 10/28/2022   TRIG (H) 10/28/2022    680.0 Triglyceride is over 400; calculations on Lipids are invalid.   HDL 41.20 10/28/2022   LDLDIRECT 142.0 10/28/2022   LDLCALC 123 (H) 08/01/2020   ALT 21 10/28/2022   AST 17 10/28/2022   NA 139 08/20/2022   K 4.5 08/20/2022   CL 102 08/20/2022   CREATININE 0.68 08/20/2022   BUN 18 08/20/2022   CO2 30 08/20/2022   TSH 3.05 09/23/2021   INR 1.0 05/25/2019   HGBA1C 7.1 (H) 08/20/2022   MICROALBUR 0.9 08/20/2022     Assessment & Plan:    See Problem List for Assessment and Plan of chronic medical problems.

## 2023-02-18 ENCOUNTER — Ambulatory Visit: Payer: Medicare PPO | Admitting: Internal Medicine

## 2023-02-18 VITALS — BP 116/74 | HR 75 | Temp 98.0°F | Ht 63.0 in | Wt 175.0 lb

## 2023-02-18 DIAGNOSIS — E781 Pure hyperglyceridemia: Secondary | ICD-10-CM

## 2023-02-18 DIAGNOSIS — E7849 Other hyperlipidemia: Secondary | ICD-10-CM

## 2023-02-18 DIAGNOSIS — H6121 Impacted cerumen, right ear: Secondary | ICD-10-CM | POA: Diagnosis not present

## 2023-02-18 DIAGNOSIS — E119 Type 2 diabetes mellitus without complications: Secondary | ICD-10-CM | POA: Diagnosis not present

## 2023-02-18 DIAGNOSIS — I10 Essential (primary) hypertension: Secondary | ICD-10-CM

## 2023-02-18 DIAGNOSIS — K2100 Gastro-esophageal reflux disease with esophagitis, without bleeding: Secondary | ICD-10-CM

## 2023-02-18 LAB — HEMOGLOBIN A1C: Hgb A1c MFr Bld: 5.9 % (ref 4.6–6.5)

## 2023-02-18 LAB — LIPID PANEL
Cholesterol: 193 mg/dL (ref 0–200)
HDL: 43.7 mg/dL (ref >39.00)
NonHDL: 148.96
Total CHOL/HDL Ratio: 4
Triglycerides: 209 mg/dL — ABNORMAL HIGH (ref 0.0–149.0)
VLDL: 41.8 mg/dL — ABNORMAL HIGH (ref 0.0–40.0)

## 2023-02-18 LAB — COMPREHENSIVE METABOLIC PANEL
ALT: 14 U/L (ref 0–35)
AST: 15 U/L (ref 0–37)
Albumin: 3.9 g/dL (ref 3.5–5.2)
Alkaline Phosphatase: 50 U/L (ref 39–117)
BUN: 16 mg/dL (ref 6–23)
CO2: 30 mEq/L (ref 19–32)
Calcium: 10.7 mg/dL — ABNORMAL HIGH (ref 8.4–10.5)
Chloride: 104 mEq/L (ref 96–112)
Creatinine, Ser: 0.77 mg/dL (ref 0.40–1.20)
GFR: 73.01 mL/min (ref >60.00)
Glucose, Bld: 110 mg/dL — ABNORMAL HIGH (ref 70–99)
Potassium: 4.2 mEq/L (ref 3.5–5.1)
Sodium: 140 mEq/L (ref 135–145)
Total Bilirubin: 0.6 mg/dL (ref 0.2–1.2)
Total Protein: 6.6 g/dL (ref 6.0–8.3)

## 2023-02-18 LAB — CBC WITH DIFFERENTIAL/PLATELET
Basophils Absolute: 0 10*3/uL (ref 0.0–0.1)
Basophils Relative: 0.7 % (ref 0.0–3.0)
Eosinophils Absolute: 0.2 10*3/uL (ref 0.0–0.7)
Eosinophils Relative: 2.8 % (ref 0.0–5.0)
HCT: 45.2 % (ref 36.0–46.0)
Hemoglobin: 15.2 g/dL — ABNORMAL HIGH (ref 12.0–15.0)
Lymphocytes Relative: 30.6 % (ref 12.0–46.0)
Lymphs Abs: 2.1 10*3/uL (ref 0.7–4.0)
MCHC: 33.7 g/dL (ref 30.0–36.0)
MCV: 89.5 fl (ref 78.0–100.0)
Monocytes Absolute: 0.5 10*3/uL (ref 0.1–1.0)
Monocytes Relative: 7.2 % (ref 3.0–12.0)
Neutro Abs: 4.1 10*3/uL (ref 1.4–7.7)
Neutrophils Relative %: 58.7 % (ref 43.0–77.0)
Platelets: 182 10*3/uL (ref 150.0–400.0)
RBC: 5.05 Mil/uL (ref 3.87–5.11)
RDW: 13.6 % (ref 11.5–15.5)
WBC: 7 10*3/uL (ref 4.0–10.5)

## 2023-02-18 LAB — HEPATIC FUNCTION PANEL
ALT: 14 U/L (ref 0–35)
AST: 15 U/L (ref 0–37)
Albumin: 3.9 g/dL (ref 3.5–5.2)
Alkaline Phosphatase: 50 U/L (ref 39–117)
Bilirubin, Direct: 0.1 mg/dL (ref 0.0–0.3)
Total Bilirubin: 0.6 mg/dL (ref 0.2–1.2)
Total Protein: 6.6 g/dL (ref 6.0–8.3)

## 2023-02-18 LAB — LDL CHOLESTEROL, DIRECT: Direct LDL: 134 mg/dL

## 2023-02-18 LAB — TSH: TSH: 2.06 u[IU]/mL (ref 0.35–5.50)

## 2023-02-18 MED ORDER — METOPROLOL TARTRATE 50 MG PO TABS: 50.0000 mg | ORAL_TABLET | Freq: Two times a day (BID) | ORAL | 1 refills | Status: DC

## 2023-02-18 MED ORDER — OZEMPIC (0.25 OR 0.5 MG/DOSE) 2 MG/1.5ML ~~LOC~~ SOPN: PEN_INJECTOR | SUBCUTANEOUS | 2 refills | Status: DC

## 2023-02-18 NOTE — Assessment & Plan Note (Signed)
Chronic Blood pressure adequately controlled Continue metoprolol 50 mg twice daily 

## 2023-02-18 NOTE — Assessment & Plan Note (Signed)
Chronic Regular exercise and healthy diet encouraged Check lipid panel today Statin intolerant-did not tolerate Crestor 5 mg 3 times weekly due to joint pain Continue Zetia 10 mg daily Continue fenofibrate 145 mg daily for triglycerides

## 2023-02-18 NOTE — Assessment & Plan Note (Addendum)
Chronic Vascepa not covered Has taken OTC omega-3 fatty acids without improvement On fenofibrate 145 mg daily for < 1 month Check lipid panel Stressed regular exercise, decreased carbs/sugars, fatty food Work on weight loss

## 2023-02-18 NOTE — Assessment & Plan Note (Addendum)
Chronic Noncompliant with low sugar diet  Lab Results  Component Value Date   HGBA1C 7.1 (H) 08/20/2022   Sugars not ideally controlled, but acceptable given age Check A1c Will change Rybelsus to Ozempic 0.25 mg weekly for 1 month and then 0.5 mg weekly-hoping that this helps more with weight and also more with sugar/triglycerides Stressed regular exercise, diabetic diet

## 2023-02-18 NOTE — Assessment & Plan Note (Signed)
Acute This is likely the cause of her symptoms She gave verbal consent for ear irrigation CMA did procedure-see procedure note

## 2023-02-18 NOTE — Progress Notes (Signed)
PRE-PROCEDURE EXAM: Right  TM cannot be visualized due to total occlusion/impaction of the ear canal.  PROCEDURE INDICATION: remove wax to visualize ear drum & relieve discomfort  CONSENT:  Verbal     PROCEDURE NOTE:     Right EAR:  I used warm water irrigation under direct visualization with the otoscope to free the wax bolus from the ear canal.    POST- PROCEDURE EXAM: Right TMs successfully visualized and found to have no erythema     The patient tolerated the procedure well.   

## 2023-02-18 NOTE — Assessment & Plan Note (Signed)
Chronic Following with GI GERD controlled Continue pantoprazole 40 mg daily

## 2023-02-18 NOTE — Addendum Note (Signed)
Addended by: Ferol Luz, Cristabel Bicknell P on: 02/18/2023 01:17 PM   Modules accepted: Orders

## 2023-03-04 DIAGNOSIS — M9903 Segmental and somatic dysfunction of lumbar region: Secondary | ICD-10-CM | POA: Diagnosis not present

## 2023-03-04 DIAGNOSIS — M5441 Lumbago with sciatica, right side: Secondary | ICD-10-CM | POA: Diagnosis not present

## 2023-03-09 DIAGNOSIS — Z1231 Encounter for screening mammogram for malignant neoplasm of breast: Secondary | ICD-10-CM | POA: Diagnosis not present

## 2023-03-09 LAB — HM MAMMOGRAPHY

## 2023-03-25 ENCOUNTER — Telehealth: Payer: Self-pay

## 2023-03-25 DIAGNOSIS — M9903 Segmental and somatic dysfunction of lumbar region: Secondary | ICD-10-CM | POA: Diagnosis not present

## 2023-03-25 DIAGNOSIS — M5441 Lumbago with sciatica, right side: Secondary | ICD-10-CM | POA: Diagnosis not present

## 2023-03-25 NOTE — Progress Notes (Signed)
   03/25/2023  Patient ID: Virginia [name], female   DOB: July 19, 1941, 82 y.o.   MRN: [id number]  Patient outreach to schedule telephone visit for medication review after patient appeared on recent quality report.  Telephone visit scheduled for Monday 5/6 at 1pm.  Lenna Gilford, PharmD, DPLA

## 2023-04-06 ENCOUNTER — Other Ambulatory Visit: Payer: Medicare PPO

## 2023-04-06 NOTE — Progress Notes (Signed)
04/06/2023 Name: Virginia [name] MRN: [id number] DOB: October 18, 1941  Chief Complaint  Patient presents with   Medication Management   Virginia [name] is a 82 y.o. year old female who presented for a telephone visit.   They were referred to the pharmacist by a quality report for assistance in managing  BP control and adherence to medications .   Patient is participating in a Managed Medicaid Plan:  No  Subjective:  Care Team: Primary Care Provider: Pincus Sanes, MD ; Next Scheduled Visit: 08/26/23  Medication Access/Adherence Current Pharmacy:  [id number] [place], [place] - [address] [place] [address] Phone: [phone] Fax: [phone]  -Patient reports affordability concerns with their medications: No  -Patient reports access/transportation concerns to their pharmacy: No  -Patient reports adherence concerns with their medications:  No   -Patient uses weekly pill organizer, and endorses good adherence- pharmacy dispense report reflects this  Diabetes: Current medications: Ozempic 0.5mg  weekly  -Patient does not check BG at home -Patient denies hypoglycemic s/sx including dizziness, shakiness, sweating.  -Patient denies hyperglycemic symptoms including polyuria, polydipsia, polyphagia, nocturia, neuropathy, blurred vision.  Hypertension: Current medications: metoprolol tartrate 50mg  BID -Does not check BP at home -Patient denies hypotensive s/sx including dizziness, lightheadedness. -Patient denies hypertensive symptoms including headache, chest pain, shortness of breath  Hyperlipidemia/ASCVD Risk Reduction Current lipid lowering medications: ezetimibe 10mg  daily, fenofibrate 145mg  daily -Medications tried in the past: statins cause pain in the base of her thumbs  Objective: Lab Results  Component Value Date   HGBA1C 5.9 02/18/2023   Lab Results  Component Value Date   CREATININE 0.77 02/18/2023   BUN 16 02/18/2023   NA 140  02/18/2023   K 4.2 02/18/2023   CL 104 02/18/2023   CO2 30 02/18/2023   Lab Results  Component Value Date   CHOL 193 02/18/2023   HDL 43.70 02/18/2023   LDLCALC 123 (H) 08/01/2020   LDLDIRECT 134.0 02/18/2023   TRIG 209.0 (H) 02/18/2023   CHOLHDL 4 02/18/2023   Medications Reviewed Today     Reviewed by Pincus Sanes, MD (Physician) on 02/17/23 at 2002  Med List Status: <None>   Medication Order Taking? Sig Documenting Provider Last Dose Status Informant  cephALEXin (KEFLEX) 500 MG capsule [id number]  Take 1 capsule (500 mg total) by mouth 2 (two) times daily. Pincus Sanes, MD  Active   Cholecalciferol (D3 HIGH POTENCY) 50 MCG (2000 UT) CAPS [id number]  Take by mouth daily. [provider]  Active   ezetimibe (ZETIA) 10 MG tablet [id number]  Take 1 tablet (10 mg total) by mouth daily. Pincus Sanes, MD  Active   fenofibrate (TRICOR) 145 MG tablet [id number]  Take 1 tablet (145 mg total) by mouth daily.  Patient not taking: Reported on 01/15/2023   Pincus Sanes, MD  Active            Med Note Richardson Dopp, [facility]   Thu Jan 15, 2023 11:17 AM) But will soon   Loratadine (KS ALLERCLEAR PO) [id number]  Take 1 tablet by mouth daily. [provider]  Active   metoprolol tartrate (LOPRESSOR) 50 MG tablet [id number]  TAKE 1 TABLET BY MOUTH TWICE A DAY Burns, Bobette Mo, MD  Active   Multiple Vitamins-Minerals (AIRBORNE GUMMIES PO) [id number]  Take 2 each by mouth daily. [provider]  Active Self  Omega-3 Fatty Acids (FISH OIL) 1000 MG CAPS [id number]  Take 1,000 mg by mouth daily. 2000 mg in  the morning and 1000 mg at night [provider]  Active Self  pantoprazole (PROTONIX) 40 MG tablet [id number]  TAKE 1 TABLET (40 MG TOTAL) BY MOUTH DAILY. **PLEASE CALL THE OFFICE TO SCHEDULE APPOINTMENT Sammuel Cooper, PA-C  Active   RYBELSUS 3 MG TABS [id number]  TAKE 1 TABLET(3 MG) BY MOUTH DAILY. Pincus Sanes, MD  Active            Assessment/Plan:   Diabetes: -  Currently controlled - Continue current regimen and regular follow-up  Hypertension: - Currently controlled - Continue current regimen and regular follow-up - If elevated in the future, consider addition of ACE-I/ARB for renal protection  Hyperlipidemia/ASCVD Risk Reduction: - Currently uncontrolled.  - Patient cannot tolerate statin therapy - Recommend discussing risk/benefit of PCSK9-I injection therapy at next PCP visit  Follow Up Plan: None at this time, but can reach out as needed per Dr. Ivy Lynn, PharmD, DPLA

## 2023-04-15 DIAGNOSIS — M9903 Segmental and somatic dysfunction of lumbar region: Secondary | ICD-10-CM | POA: Diagnosis not present

## 2023-04-15 DIAGNOSIS — M5441 Lumbago with sciatica, right side: Secondary | ICD-10-CM | POA: Diagnosis not present

## 2023-05-06 DIAGNOSIS — M5441 Lumbago with sciatica, right side: Secondary | ICD-10-CM | POA: Diagnosis not present

## 2023-05-06 DIAGNOSIS — M9903 Segmental and somatic dysfunction of lumbar region: Secondary | ICD-10-CM | POA: Diagnosis not present

## 2023-05-11 ENCOUNTER — Other Ambulatory Visit: Payer: Self-pay | Admitting: Internal Medicine

## 2023-05-13 NOTE — Progress Notes (Signed)
05/15/2023 Virginia [name] [id number] 15-May-1941  Referring provider: Pincus Sanes, MD Primary GI doctor: Dr. Rhea Belton  ASSESSMENT AND PLAN:   Gastroesophageal reflux disease with history of hiatal hernia  EGD 06/2019 with HH/esophagitis/gastritis Symptoms improved on PPI daily, no dysphagia, rare GERD Lifestyle changes discussed, avoid NSAIDS, ETOH Discussed GLP1 with the patient, mechanism of action and how this can worsen and/or cause nausea/GERD by causing gastroparesis, discussed diet, tolerating well.  Follow up 1 year  Constipation secondary to medications - Increase fiber/ water intake, decrease caffeine, increase activity level. -Will add on Miralax daily and Benefiber  Personal history of colonic polyps 01/21/2022 colonoscopy Dr. Rhea Belton for history of adenomatous polyps, good bowel prep, 3 mm TA polyp cecum, 4 mm TA polyp in ascending colon, 8 mm TA polyp descending, 3 mm hyperplastic polyp sigmoid, diverticulosis and small internal hemorrhoids.   No recall secondary to age.  History of Present Illness:  82 y.o. female  with a past medical history of anxiety, CKD, chol, osteopenia, history of GERD, tubular adenoma, diverticulosis and others listed below, returns to clinic today for evaluation of gerd.  05/2018 colonoscopy due to history of adenomatous polyps with villous component had 3 polyps largest 5 mm tubular adenoma no high-grade dysplasia.  Diverticula and small internal hemorrhoids. 06/24/2019 upper endo for IDA medium sized hiatal hernia, gastritis, gastric polyps 06/30/2019 capsule endo cratered ulcers small bowel 09/24/2021 office visit with Mike Gip for mid/right lower quadrant abdominal discomfort proceeded with CT and colon if unrevealing.  But patient never proceeded with CT abdomen pelvis with contrast. 01/21/2022 colonoscopy Dr. Rhea Belton for history of adenomatous polyps, good bowel prep, 3 mm TA polyp cecum, 4 mm TA polyp in ascending colon, 8 mm TA  polyp descending, 3 mm hyperplastic polyp sigmoid, diverticulosis and small internal hemorrhoids.  No recall secondary to age.  09/23/2023 office visit with myself in regards to reflux suggested taking PPI every other day with history of hiatal hernia and Cameron lesions, discussed GLP-1 and monitor for GERD/nausea and gastroparesis.  Right lower quadrant abdominal pain resolved. Patient presents for follow-up  She remains on the ozempic 0.5 mg.  She is on the protonix every day and has only had GERD once every every few months. Associated with spicy foods.  She denies dysphagia, melena.  She denies nausea, vomiting.  No unintentional weight loss, no night sweats. She denies nocturnal symptoms.   She has had harder stools, not as smooth   She denies NSAID use. Uses tylenol.  She denies ETOH use.     She  reports that she has never smoked. She has never used smokeless tobacco. She reports that she does not drink alcohol and does not use drugs. Her family history includes Arthritis in her mother; Coronary artery disease in her father; Diabetes in her maternal aunt and paternal aunt; Heart disease in her cousin; Hyperlipidemia in her brother and cousin.   Current Medications:   Current Outpatient Medications (Endocrine & Metabolic):    Semaglutide,0.25 or 0.5MG /DOS, (OZEMPIC, 0.25 OR 0.5 MG/DOSE,) 2 MG/3ML SOPN, INJECT 0.25 MG INTO THE SKIN ONCE A WEEK FOR 30 DAYS, THEN 0.5 MG ONCE A WEEK.  Current Outpatient Medications (Cardiovascular):    ezetimibe (ZETIA) 10 MG tablet, Take 1 tablet (10 mg total) by mouth daily.   fenofibrate (TRICOR) 145 MG tablet, Take 1 tablet (145 mg total) by mouth daily.   metoprolol tartrate (LOPRESSOR) 50 MG tablet, Take 1 tablet (50 mg total) by mouth 2 (two) times  daily.  Current Outpatient Medications (Respiratory):    Loratadine (KS ALLERCLEAR PO), Take 1 tablet by mouth daily.    Current Outpatient Medications (Other):    Cholecalciferol (D3 HIGH  POTENCY) 50 MCG (2000 UT) CAPS, Take by mouth daily.   Multiple Vitamins-Minerals (AIRBORNE GUMMIES PO), Take 2 each by mouth daily.   pantoprazole (PROTONIX) 40 MG tablet, TAKE 1 TABLET (40 MG TOTAL) BY MOUTH DAILY. **PLEASE CALL THE OFFICE TO SCHEDULE APPOINTMENT   Omega-3 Fatty Acids (FISH OIL) 1000 MG CAPS, Take 1,000 mg by mouth daily. 2000 mg in the morning and 1000 mg at night (Patient not taking: Reported on 04/06/2023)  Surgical History:  She  has a past surgical history that includes child birth (50 & 66); Low back surgery (01/1994); Tonsillectomy (1968); Fibroidectomy; Colonoscopy; Polypectomy; Upper gastrointestinal endoscopy; and Esophageal dilation (N/A).  Current Medications, Allergies, Past Medical History, Past Surgical History, Family History and Social History were reviewed in [employer] record.  Physical Exam: BP 116/72 (BP Location: Left Arm, Patient Position: Sitting, Cuff Size: Normal)   Pulse 65   Ht 5\' 3"  (1.6 m)   Wt 171 lb 4 oz (77.7 kg)   SpO2 98%   BMI 30.34 kg/m  General:   Pleasant, well developed female in no acute distress Heart : Regular rate and rhythm; no murmurs Pulm: Clear anteriorly; no wheezing Abdomen:  Soft, Obese AB, Active bowel sounds. No tenderness . , No organomegaly appreciated. Rectal: Not evaluated Extremities:  without  edema. Neurologic:  Alert and  oriented x4;  No focal deficits.  Psych:  Cooperative. Normal mood and affect.   Doree Albee, PA-C 05/15/23

## 2023-05-15 ENCOUNTER — Encounter: Payer: Self-pay | Admitting: Physician Assistant

## 2023-05-15 ENCOUNTER — Ambulatory Visit: Payer: Medicare PPO | Admitting: Physician Assistant

## 2023-05-15 VITALS — BP 116/72 | HR 65 | Ht 63.0 in | Wt 171.2 lb

## 2023-05-15 DIAGNOSIS — K2100 Gastro-esophageal reflux disease with esophagitis, without bleeding: Secondary | ICD-10-CM

## 2023-05-15 DIAGNOSIS — Z860100 Personal history of colon polyps, unspecified: Secondary | ICD-10-CM

## 2023-05-15 DIAGNOSIS — K5903 Drug induced constipation: Secondary | ICD-10-CM

## 2023-05-15 NOTE — Patient Instructions (Addendum)
Miralax is an osmotic laxative.  It only brings more water into the stool.  This is safe to take daily.  Can take up to 17 gram of miralax twice a day.  Mix with juice or coffee.  Start 1 capful at night for 3-4 days and reassess your response in 3-4 days.  You can increase and decrease the dose based on your response.  Remember, it can take up to 3-4 days to take effect OR for the effects to wear off.   I often pair this with benefiber in the morning to help assure the stool is not too loose.   Please take your proton pump inhibitor medication, protonix daily  Please take this medication 30 minutes to 1 hour before meals- this makes it more effective.  Silent reflux: Not all heartburn burns...Marland KitchenMarland KitchenMarland Kitchen  What is LPR? Laryngopharyngeal reflux (LPR) or silent reflux is a condition in which acid that is made in the stomach travels up the esophagus (swallowing tube) and gets to the throat. Not everyone with reflux has a lot of heartburn or indigestion. In fact, many people with LPR never have heartburn. This is why LPR is called SILENT REFLUX, and the terms "Silent reflux" and "LPR" are often used interchangeably. Because LPR is silent, it is sometimes difficult to diagnose.  How can you tell if you have LPR?  Chronic hoarseness- Some people have hoarseness that comes and goes throat clearing  Cough It can cause shortness of breath and cause asthma like symptoms. a feeling of a lump in the throat  difficulty swallowing a problem with too much nose and throat drainage.  Some people will feel their esophagus spasm which feels like their heart beating hard and fast, this will usually be after a meal, at rest, or lying down at night.    How do I treat this? Treatment for LPR should be individualized, and your doctor will suggest the best treatment for you. Generally there are several treatments for LPR: changing habits and diet to reduce reflux,  medications to reduce stomach acid, and  surgery to  prevent reflux. Most people with LPR need to modify how and when they eat, as well as take some medication, to get well. Sometimes, nonprescription liquid antacids, such as Maalox, Gelucil and Mylanta are recommended. When used, these antacids should be taken four times each day - one tablespoon one hour after each meal and before bedtime. Dietary and lifestyle changes alone are not often enough to control LPR - medications that reduce stomach acid are also usually needed. These must be prescribed by our doctor.   TIPS FOR REDUCING REFLUX AND LPR Control your LIFE-STYLE and your DIET! If you use tobacco, QUIT.  Smoking makes you reflux. After every cigarette you have some LPR.  Don't wear clothing that is too tight, especially around the waist (trousers, corsets, belts).  Do not lie down just after eating...in fact, do not eat within three hours of bedtime.  You should be on a low-fat diet.  Limit your intake of red meat.  Limit your intake of butter.  Avoid fried foods.  Avoid chocolate  Avoid cheese.  Avoid eggs. Specifically avoid caffeine (especially coffee and tea), soda pop (especially cola) and mints.  Avoid alcoholic beverages, particularly in the evening.    Gastroparesis Please do small frequent meals like 4-6 meals a day.  Eat and drink liquids at separate times.  Avoid high fiber foods, cook your vegetables, avoid high fat food.  Suggest spreading protein  throughout the day (greek yogurt, glucerna, soft meat, milk, eggs) Choose soft foods that you can mash with a fork When you are more symptomatic, change to pureed foods foods and liquids.  Consider reading "Living well with Gastroparesis" by Reuel Derby Gastroparesis is a condition in which food takes longer than normal to empty from the stomach. This condition is also known as delayed gastric emptying. It is usually a long-term (chronic) condition. There is no cure, but there are treatments and things that you  can do at home to help relieve symptoms. Treating the underlying condition that causes gastroparesis can also help relieve symptoms What are the causes? In many cases, the cause of this condition is not known. Possible causes include: A hormone (endocrine) disorder, such as hypothyroidism or diabetes. A nervous system disease, such as Parkinson's disease or multiple sclerosis. Cancer, infection, or surgery that affects the stomach or vagus nerve. The vagus nerve runs from your chest, through your neck, and to the lower part of your brain. A connective tissue disorder, such as scleroderma. Certain medicines. What increases the risk? You are more likely to develop this condition if: You have certain disorders or diseases. These may include: An endocrine disorder. An eating disorder. Amyloidosis. Scleroderma. Parkinson's disease. Multiple sclerosis. Cancer or infection of the stomach or the vagus nerve. You have had surgery on your stomach or vagus nerve. You take certain medicines. You are female. What are the signs or symptoms? Symptoms of this condition include: Feeling full after eating very little or a loss of appetite. Nausea, vomiting, or heartburn. Bloating of your abdomen. Inconsistent blood sugar (glucose) levels on blood tests. Unexplained weight loss. Acid from the stomach coming up into the esophagus (gastroesophageal reflux). Sudden tightening (spasm) of the stomach, which can be painful. Symptoms may come and go. Some people may not notice any symptoms. How is this diagnosed? This condition is diagnosed with tests, such as: Tests that check how long it takes food to move through the stomach and intestines. These tests include: Upper gastrointestinal (GI) series. For this test, you drink a liquid that shows up well on X-rays, and then X-rays are taken of your intestines. Gastric emptying scintigraphy. For this test, you eat food that contains a small amount of  radioactive material, and then scans are taken. Wireless capsule GI monitoring system. For this test, you swallow a pill (capsule) that records information about how foods and fluid move through your stomach. Gastric manometry. For this test, a tube is passed down your throat and into your stomach to measure electrical and muscular activity. Endoscopy. For this test, a long, thin tube with a camera and light on the end is passed down your throat and into your stomach to check for problems in your stomach lining. Ultrasound. This test uses sound waves to create images of the inside of your body. This can help rule out gallbladder disease or pancreatitis as a cause of your symptoms. How is this treated? There is no cure for this condition, but treatment and home care may relieve symptoms. Treatment may include: Treating the underlying cause. Managing your symptoms by making changes to your diet and exercise habits. Taking medicines to control nausea and vomiting and to stimulate stomach muscles. Getting food through a feeding tube in the hospital. This may be done in severe cases. Having surgery to insert a device called a gastric electrical stimulator into your body. This device helps improve stomach emptying and control nausea and vomiting. Follow these instructions  at home: Take over-the-counter and prescription medicines only as told by your health care provider. Follow instructions from your health care provider about eating or drinking restrictions. Your health care provider may recommend that you: Eat smaller meals more often. Eat low-fat foods. Eat low-fiber forms of high-fiber foods. For example, eat cooked vegetables instead of raw vegetables. Have only liquid foods instead of solid foods. Liquid foods are easier to digest. Drink enough fluid to keep your urine pale yellow. Exercise as often as told by your health care provider. Keep all follow-up visits. This is important. Contact a  health care provider if you: Notice that your symptoms do not improve with treatment. Have new symptoms. Get help right away if you: Have severe pain in your abdomen that does not improve with treatment. Have nausea that is severe or does not go away. Vomit every time you drink fluids. Summary Gastroparesis is a long-term (chronic) condition in which food takes longer than normal to empty from the stomach. Symptoms include nausea, vomiting, heartburn, bloating of your abdomen, and loss of appetite. Eating smaller portions, low-fat foods, and low-fiber forms of high-fiber foods may help you manage your symptoms. Get help right away if you have severe pain in your abdomen. This information is not intended to replace advice given to you by your health care provider. Make sure you discuss any questions you have with your health care provider. Document Revised: 03/26/2020 Document Reviewed: 03/26/2020 Elsevier Patient Education  2021 [employer]  Go to the ER if unable to pass gas, severe AB pain, unable to hold down food, any shortness of breath of chest pain.  _______________________________________________________  If your blood pressure at your visit was 140/90 or greater, please contact your primary care physician to follow up on this.  _______________________________________________________  If you are age 30 or older, your body mass index should be between 23-30. Your Body mass index is 30.34 kg/m. If this is out of the aforementioned range listed, please consider follow up with your Primary Care Provider.  If you are age 80 or younger, your body mass index should be between 19-25. Your Body mass index is 30.34 kg/m. If this is out of the aformentioned range listed, please consider follow up with your Primary Care Provider.   ________________________________________________________  The [facility] providers would like to encourage you to use [facility] to communicate with providers  for non-urgent requests or questions.  Due to long hold times on the telephone, sending your provider a message by [facility] may be a faster and more efficient way to get a response.  Please allow 48 business hours for a response.  Please remember that this is for non-urgent requests.   It was a pleasure to see you today!  Thank you for trusting me with your gastrointestinal care!

## 2023-05-20 NOTE — Progress Notes (Signed)
Addendum: Reviewed and agree with assessment and management plan. Deliana Avalos M, MD  

## 2023-05-28 DIAGNOSIS — M9903 Segmental and somatic dysfunction of lumbar region: Secondary | ICD-10-CM | POA: Diagnosis not present

## 2023-05-28 DIAGNOSIS — M5441 Lumbago with sciatica, right side: Secondary | ICD-10-CM | POA: Diagnosis not present

## 2023-06-17 DIAGNOSIS — M9903 Segmental and somatic dysfunction of lumbar region: Secondary | ICD-10-CM | POA: Diagnosis not present

## 2023-06-17 DIAGNOSIS — M5441 Lumbago with sciatica, right side: Secondary | ICD-10-CM | POA: Diagnosis not present

## 2023-07-10 ENCOUNTER — Other Ambulatory Visit: Payer: Self-pay | Admitting: Internal Medicine

## 2023-07-15 DIAGNOSIS — M9903 Segmental and somatic dysfunction of lumbar region: Secondary | ICD-10-CM | POA: Diagnosis not present

## 2023-07-15 DIAGNOSIS — M5441 Lumbago with sciatica, right side: Secondary | ICD-10-CM | POA: Diagnosis not present

## 2023-07-16 ENCOUNTER — Encounter (INDEPENDENT_AMBULATORY_CARE_PROVIDER_SITE_OTHER): Payer: Self-pay

## 2023-07-31 DIAGNOSIS — E1136 Type 2 diabetes mellitus with diabetic cataract: Secondary | ICD-10-CM | POA: Diagnosis not present

## 2023-07-31 DIAGNOSIS — M8580 Other specified disorders of bone density and structure, unspecified site: Secondary | ICD-10-CM | POA: Diagnosis not present

## 2023-07-31 DIAGNOSIS — M419 Scoliosis, unspecified: Secondary | ICD-10-CM | POA: Diagnosis not present

## 2023-07-31 DIAGNOSIS — K219 Gastro-esophageal reflux disease without esophagitis: Secondary | ICD-10-CM | POA: Diagnosis not present

## 2023-07-31 DIAGNOSIS — I10 Essential (primary) hypertension: Secondary | ICD-10-CM | POA: Diagnosis not present

## 2023-07-31 DIAGNOSIS — M1990 Unspecified osteoarthritis, unspecified site: Secondary | ICD-10-CM | POA: Diagnosis not present

## 2023-07-31 DIAGNOSIS — E785 Hyperlipidemia, unspecified: Secondary | ICD-10-CM | POA: Diagnosis not present

## 2023-07-31 DIAGNOSIS — J309 Allergic rhinitis, unspecified: Secondary | ICD-10-CM | POA: Diagnosis not present

## 2023-07-31 DIAGNOSIS — Z809 Family history of malignant neoplasm, unspecified: Secondary | ICD-10-CM | POA: Diagnosis not present

## 2023-08-05 DIAGNOSIS — M9903 Segmental and somatic dysfunction of lumbar region: Secondary | ICD-10-CM | POA: Diagnosis not present

## 2023-08-05 DIAGNOSIS — M5441 Lumbago with sciatica, right side: Secondary | ICD-10-CM | POA: Diagnosis not present

## 2023-08-08 ENCOUNTER — Other Ambulatory Visit: Payer: Self-pay | Admitting: Internal Medicine

## 2023-08-11 ENCOUNTER — Other Ambulatory Visit: Payer: Self-pay | Admitting: Internal Medicine

## 2023-08-12 DIAGNOSIS — H02403 Unspecified ptosis of bilateral eyelids: Secondary | ICD-10-CM | POA: Diagnosis not present

## 2023-08-12 DIAGNOSIS — H04123 Dry eye syndrome of bilateral lacrimal glands: Secondary | ICD-10-CM | POA: Diagnosis not present

## 2023-08-12 DIAGNOSIS — H35033 Hypertensive retinopathy, bilateral: Secondary | ICD-10-CM | POA: Diagnosis not present

## 2023-08-12 DIAGNOSIS — H25813 Combined forms of age-related cataract, bilateral: Secondary | ICD-10-CM | POA: Diagnosis not present

## 2023-08-12 LAB — HM DIABETES EYE EXAM

## 2023-08-13 ENCOUNTER — Other Ambulatory Visit: Payer: Self-pay

## 2023-08-13 MED ORDER — OZEMPIC (0.25 OR 0.5 MG/DOSE) 2 MG/3ML ~~LOC~~ SOPN: 0.5000 mg | PEN_INJECTOR | SUBCUTANEOUS | 0 refills | Status: DC

## 2023-08-25 ENCOUNTER — Encounter: Payer: Self-pay | Admitting: Internal Medicine

## 2023-08-25 NOTE — Progress Notes (Unsigned)
Subjective:    Patient ID: Virginia [name], female    DOB: 27-Feb-1941, 82 y.o.   MRN: [id number]      HPI [name] is here for a Physical exam and her chronic medical problems.   Doing well - no concerns.   Medications and allergies reviewed with patient and updated if appropriate.  Current Outpatient Medications on File Prior to Visit  Medication Sig Dispense Refill   Cholecalciferol (D3 HIGH POTENCY) 50 MCG (2000 UT) CAPS Take by mouth daily.     ezetimibe (ZETIA) 10 MG tablet TAKE 1 TABLET BY MOUTH EVERY DAY 90 tablet 3   fenofibrate (TRICOR) 145 MG tablet TAKE 1 TABLET BY MOUTH EVERY DAY 90 tablet 1   Loratadine (KS ALLERCLEAR PO) Take 1 tablet by mouth daily.     metoprolol tartrate (LOPRESSOR) 50 MG tablet Take 1 tablet (50 mg total) by mouth 2 (two) times daily. 180 tablet 1   Multiple Vitamins-Minerals (AIRBORNE GUMMIES PO) Take 2 each by mouth daily.     pantoprazole (PROTONIX) 40 MG tablet TAKE 1 TABLET (40 MG TOTAL) BY MOUTH DAILY. **PLEASE CALL THE OFFICE TO SCHEDULE APPOINTMENT 90 tablet 3   Semaglutide,0.25 or 0.5MG /DOS, (OZEMPIC, 0.25 OR 0.5 MG/DOSE,) 2 MG/3ML SOPN INJECT 0.25 MG INTO THE SKIN ONCE A WEEK FOR 30 DAYS, THEN 0.5 MG ONCE A WEEK. 3 mL 2   No current facility-administered medications on file prior to visit.    Review of Systems  Constitutional:  Negative for fever.  HENT:  Positive for voice change (at times). Negative for congestion, postnasal drip and sore throat.   Eyes:  Negative for visual disturbance.  Respiratory:  Negative for cough, shortness of breath and wheezing.   Cardiovascular:  Negative for chest pain, palpitations and leg swelling.  Gastrointestinal:  Negative for abdominal pain, blood in stool, constipation and diarrhea.       No gerd  Genitourinary:  Negative for dysuria.  Musculoskeletal:  Positive for back pain (chiropractor regularly). Negative for arthralgias.  Skin:  Negative for rash.  Neurological:  Negative for  light-headedness and headaches.  Psychiatric/Behavioral:  Negative for dysphoric mood. The patient is not nervous/anxious.        Objective:   Vitals:   08/26/23 1049  BP: 126/74  Pulse: 72  Temp: 98.2 F (36.8 C)  SpO2: 98%   Filed Weights   08/26/23 1049  Weight: 171 lb (77.6 kg)   Body mass index is 30.29 kg/m.  BP Readings from Last 3 Encounters:  08/26/23 126/74  05/15/23 116/72  02/18/23 116/74    Wt Readings from Last 3 Encounters:  08/26/23 171 lb (77.6 kg)  05/15/23 171 lb 4 oz (77.7 kg)  02/18/23 175 lb (79.4 kg)       Physical Exam Constitutional: She appears well-developed and well-nourished. No distress.  HENT:  Head: Normocephalic and atraumatic.  Right Ear: External ear normal. Normal ear canal and TM Left Ear: External ear normal.  Normal ear canal and TM Mouth/Throat: Oropharynx is clear and moist.  Eyes: Conjunctivae normal.  Neck: Neck supple. No tracheal deviation present. No thyromegaly present.  No carotid bruit  Cardiovascular: Normal rate, regular rhythm and normal heart sounds.   No murmur heard.  No edema. Pulmonary/Chest: Effort normal and breath sounds normal. No respiratory distress. She has no wheezes. She has no rales.  Breast: deferred   Abdominal: Soft. She exhibits no distension. There is no tenderness.  Lymphadenopathy: She has no cervical adenopathy.  Skin:  Skin is warm and dry. She is not diaphoretic.  Psychiatric: She has a normal mood and affect. Her behavior is normal.     Lab Results  Component Value Date   WBC 7.0 02/18/2023   HGB 15.2 (H) 02/18/2023   HCT 45.2 02/18/2023   PLT 182.0 02/18/2023   GLUCOSE 110 (H) 02/18/2023   CHOL 193 02/18/2023   TRIG 209.0 (H) 02/18/2023   HDL 43.70 02/18/2023   LDLDIRECT 134.0 02/18/2023   LDLCALC 123 (H) 08/01/2020   ALT 14 02/18/2023   ALT 14 02/18/2023   AST 15 02/18/2023   AST 15 02/18/2023   NA 140 02/18/2023   K 4.2 02/18/2023   CL 104 02/18/2023   CREATININE  0.77 02/18/2023   BUN 16 02/18/2023   CO2 30 02/18/2023   TSH 2.06 02/18/2023   INR 1.0 05/25/2019   HGBA1C 5.9 02/18/2023   MICROALBUR 0.9 08/20/2022         Assessment & Plan:   Physical exam: Screening blood work  ordered Exercise  active, home exercises Weight  obese Substance abuse  none   Reviewed recommended immunizations.  Flu immunization administered today.     Health Maintenance  Topic Date Due   FOOT EXAM  Never done   DTaP/Tdap/Td (2 - Tdap) 10/01/2016   INFLUENZA VACCINE  07/02/2023   COVID-19 Vaccine (4 - 2023-24 season) 08/02/2023   Diabetic kidney evaluation - Urine ACR  08/21/2023   Medicare Annual Wellness (AWV)  08/21/2023   HEMOGLOBIN A1C  08/21/2023   Diabetic kidney evaluation - eGFR measurement  02/18/2024   OPHTHALMOLOGY EXAM  08/11/2024   Pneumonia Vaccine 62+ Years old  Completed   Zoster Vaccines- Shingrix  Completed   HPV VACCINES  Aged Out   DEXA SCAN  Discontinued   Colonoscopy  Discontinued   Hepatitis C Screening  Discontinued          See Problem List for Assessment and Plan of chronic medical problems.

## 2023-08-25 NOTE — Patient Instructions (Addendum)
Flu immunization administered today.      Blood work was ordered.   The lab is on the first floor.    Medications changes include :   none     Return in about 6 months (around 02/23/2024) for follow up.   Health Maintenance, Female Adopting a healthy lifestyle and getting preventive care are important in promoting health and wellness. Ask your health care provider about: The right schedule for you to have regular tests and exams. Things you can do on your own to prevent diseases and keep yourself healthy. What should I know about diet, weight, and exercise? Eat a healthy diet  Eat a diet that includes plenty of vegetables, fruits, low-fat dairy products, and lean protein. Do not eat a lot of foods that are high in solid fats, added sugars, or sodium. Maintain a healthy weight Body mass index (BMI) is used to identify weight problems. It estimates body fat based on height and weight. Your health care provider can help determine your BMI and help you achieve or maintain a healthy weight. Get regular exercise Get regular exercise. This is one of the most important things you can do for your health. Most adults should: Exercise for at least 150 minutes each week. The exercise should increase your heart rate and make you sweat (moderate-intensity exercise). Do strengthening exercises at least twice a week. This is in addition to the moderate-intensity exercise. Spend less time sitting. Even light physical activity can be beneficial. Watch cholesterol and blood lipids Have your blood tested for lipids and cholesterol at 80 years of age, then have this test every 5 years. Have your cholesterol levels checked more often if: Your lipid or cholesterol levels are high. You are older than 80 years of age. You are at high risk for heart disease. What should I know about cancer screening? Depending on your health history and family history, you may need to have cancer screening at  various ages. This may include screening for: Breast cancer. Cervical cancer. Colorectal cancer. Skin cancer. Lung cancer. What should I know about heart disease, diabetes, and high blood pressure? Blood pressure and heart disease High blood pressure causes heart disease and increases the risk of stroke. This is more likely to develop in people who have high blood pressure readings or are overweight. Have your blood pressure checked: Every 3-5 years if you are 60-71 years of age. Every year if you are 85 years old or older. Diabetes Have regular diabetes screenings. This checks your fasting blood sugar level. Have the screening done: Once every three years after age 47 if you are at a normal weight and have a low risk for diabetes. More often and at a younger age if you are overweight or have a high risk for diabetes. What should I know about preventing infection? Hepatitis B If you have a higher risk for hepatitis B, you should be screened for this virus. Talk with your health care provider to find out if you are at risk for hepatitis B infection. Hepatitis C Testing is recommended for: Everyone born from [birth year] through 1963. Anyone with known risk factors for hepatitis C. Sexually transmitted infections (STIs) Get screened for STIs, including gonorrhea and chlamydia, if: You are sexually active and are younger than 80 years of age. You are older than 80 years of age and your health care provider tells you that you are at risk for this type of infection. Your sexual activity has changed since you  were last screened, and you are at increased risk for chlamydia or gonorrhea. Ask your health care provider if you are at risk. Ask your health care provider about whether you are at high risk for HIV. Your health care provider may recommend a prescription medicine to help prevent HIV infection. If you choose to take medicine to prevent HIV, you should first get tested for HIV. You should then be  tested every 3 months for as long as you are taking the medicine. Pregnancy If you are about to stop having your period (premenopausal) and you may become pregnant, seek counseling before you get pregnant. Take 400 to 800 micrograms (mcg) of folic acid every day if you become pregnant. Ask for birth control (contraception) if you want to prevent pregnancy. Osteoporosis and menopause Osteoporosis is a disease in which the bones lose minerals and strength with aging. This can result in bone fractures. If you are 84 years old or older, or if you are at risk for osteoporosis and fractures, ask your health care provider if you should: Be screened for bone loss. Take a calcium or vitamin D supplement to lower your risk of fractures. Be given hormone replacement therapy (HRT) to treat symptoms of menopause. Follow these instructions at home: Alcohol use Do not drink alcohol if: Your health care provider tells you not to drink. You are pregnant, may be pregnant, or are planning to become pregnant. If you drink alcohol: Limit how much you have to: 0-1 drink a day. Know how much alcohol is in your drink. In the [place] one drink equals one 12 oz bottle of beer (355 mL), one 5 oz glass of wine (148 mL), or one 1 oz glass of hard liquor (44 mL). Lifestyle Do not use any products that contain nicotine or tobacco. These products include cigarettes, chewing tobacco, and vaping devices, such as e-cigarettes. If you need help quitting, ask your health care provider. Do not use street drugs. Do not share needles. Ask your health care provider for help if you need support or information about quitting drugs. General instructions Schedule regular health, dental, and eye exams. Stay current with your vaccines. Tell your health care provider if: You often feel depressed. You have ever been abused or do not feel safe at home. Summary Adopting a healthy lifestyle and getting preventive care are important in  promoting health and wellness. Follow your health care provider's instructions about healthy diet, exercising, and getting tested or screened for diseases. Follow your health care provider's instructions on monitoring your cholesterol and blood pressure. This information is not intended to replace advice given to you by your health care provider. Make sure you discuss any questions you have with your health care provider. Document Revised: 04/08/2021 Document Reviewed: 04/08/2021 Elsevier Patient Education  2024 [employer].

## 2023-08-26 ENCOUNTER — Ambulatory Visit (INDEPENDENT_AMBULATORY_CARE_PROVIDER_SITE_OTHER): Payer: Medicare PPO | Admitting: Internal Medicine

## 2023-08-26 VITALS — BP 126/74 | HR 72 | Temp 98.2°F | Ht 63.0 in | Wt 171.0 lb

## 2023-08-26 DIAGNOSIS — Z23 Encounter for immunization: Secondary | ICD-10-CM

## 2023-08-26 DIAGNOSIS — E781 Pure hyperglyceridemia: Secondary | ICD-10-CM | POA: Diagnosis not present

## 2023-08-26 DIAGNOSIS — T466X5A Adverse effect of antihyperlipidemic and antiarteriosclerotic drugs, initial encounter: Secondary | ICD-10-CM

## 2023-08-26 DIAGNOSIS — Z0000 Encounter for general adult medical examination without abnormal findings: Secondary | ICD-10-CM

## 2023-08-26 DIAGNOSIS — E7849 Other hyperlipidemia: Secondary | ICD-10-CM | POA: Diagnosis not present

## 2023-08-26 DIAGNOSIS — G720 Drug-induced myopathy: Secondary | ICD-10-CM

## 2023-08-26 DIAGNOSIS — K2100 Gastro-esophageal reflux disease with esophagitis, without bleeding: Secondary | ICD-10-CM

## 2023-08-26 DIAGNOSIS — E119 Type 2 diabetes mellitus without complications: Secondary | ICD-10-CM

## 2023-08-26 DIAGNOSIS — I10 Essential (primary) hypertension: Secondary | ICD-10-CM | POA: Diagnosis not present

## 2023-08-26 LAB — CBC WITH DIFFERENTIAL/PLATELET
Basophils Absolute: 0 10*3/uL (ref 0.0–0.1)
Basophils Relative: 0.6 % (ref 0.0–3.0)
Eosinophils Absolute: 0.2 10*3/uL (ref 0.0–0.7)
Eosinophils Relative: 2.8 % (ref 0.0–5.0)
HCT: 43.7 % (ref 36.0–46.0)
Hemoglobin: 14.4 g/dL (ref 12.0–15.0)
Lymphocytes Relative: 34.6 % (ref 12.0–46.0)
Lymphs Abs: 2.4 10*3/uL (ref 0.7–4.0)
MCHC: 33 g/dL (ref 30.0–36.0)
MCV: 89.8 fl (ref 78.0–100.0)
Monocytes Absolute: 0.5 10*3/uL (ref 0.1–1.0)
Monocytes Relative: 7.7 % (ref 3.0–12.0)
Neutro Abs: 3.8 10*3/uL (ref 1.4–7.7)
Neutrophils Relative %: 54.3 % (ref 43.0–77.0)
Platelets: 203 10*3/uL (ref 150.0–400.0)
RBC: 4.86 Mil/uL (ref 3.87–5.11)
RDW: 13.6 % (ref 11.5–15.5)
WBC: 7 10*3/uL (ref 4.0–10.5)

## 2023-08-26 LAB — COMPREHENSIVE METABOLIC PANEL
ALT: 18 U/L (ref 0–35)
AST: 17 U/L (ref 0–37)
Albumin: 4.1 g/dL (ref 3.5–5.2)
Alkaline Phosphatase: 54 U/L (ref 39–117)
BUN: 20 mg/dL (ref 6–23)
CO2: 30 mEq/L (ref 19–32)
Calcium: 11 mg/dL — ABNORMAL HIGH (ref 8.4–10.5)
Chloride: 105 mEq/L (ref 96–112)
Creatinine, Ser: 0.84 mg/dL (ref 0.40–1.20)
GFR: 65.53 mL/min (ref >60.00)
Glucose, Bld: 109 mg/dL — ABNORMAL HIGH (ref 70–99)
Potassium: 3.9 mEq/L (ref 3.5–5.1)
Sodium: 141 mEq/L (ref 135–145)
Total Bilirubin: 0.7 mg/dL (ref 0.2–1.2)
Total Protein: 6.7 g/dL (ref 6.0–8.3)

## 2023-08-26 LAB — LIPID PANEL
Cholesterol: 175 mg/dL (ref 0–200)
HDL: 46.2 mg/dL (ref >39.00)
LDL Cholesterol: 92 mg/dL (ref 0–99)
NonHDL: 128.3
Total CHOL/HDL Ratio: 4
Triglycerides: 182 mg/dL — ABNORMAL HIGH (ref 0.0–149.0)
VLDL: 36.4 mg/dL (ref 0.0–40.0)

## 2023-08-26 LAB — MICROALBUMIN / CREATININE URINE RATIO
Creatinine,U: 90.5 mg/dL
Microalb Creat Ratio: 0.8 mg/g (ref 0.0–30.0)
Microalb, Ur: <0.7 mg/dL (ref 0.0–1.9)

## 2023-08-26 LAB — HEMOGLOBIN A1C: Hgb A1c MFr Bld: 5.7 % (ref 4.6–6.5)

## 2023-08-26 MED ORDER — OZEMPIC (0.25 OR 0.5 MG/DOSE) 2 MG/3ML ~~LOC~~ SOPN: 0.5000 mg | PEN_INJECTOR | SUBCUTANEOUS | 1 refills | Status: DC

## 2023-08-26 NOTE — Assessment & Plan Note (Signed)
Chronic Following with GI GERD controlled Continue pantoprazole 40 mg daily

## 2023-08-26 NOTE — Assessment & Plan Note (Addendum)
Chronic  Lab Results  Component Value Date   HGBA1C 5.9 02/18/2023   Sugars well controlled Check A1c, urine microalbumin Continue Ozempic 0.5 mg weekly - wants to continue current dose Stressed regular exercise, diabetic diet

## 2023-08-26 NOTE — Assessment & Plan Note (Signed)
Chronic Vascepa not covered OTC omega-3 fatty acids not effective Statin intolerant Continue fenofibrate 145 mg daily, Zetia 10 mg daily Check lipid panel Stressed regular exercise, decreased carbs/sugars, fatty food Work on weight loss

## 2023-08-26 NOTE — Assessment & Plan Note (Signed)
Chronic Blood pressure adequately controlled CBC, CMP Continue metoprolol 50 mg twice daily

## 2023-08-26 NOTE — Assessment & Plan Note (Signed)
Chronic Intolerant of Crestor and pitavastatin secondary to muscle pain joint pain Continue Zetia, fenofibrate

## 2023-08-26 NOTE — Assessment & Plan Note (Signed)
Chronic Regular exercise and healthy diet encouraged Check lipid panel today Statin intolerant-did not tolerate Crestor or pitavastatin Continue Zetia 10 mg daily Continue fenofibrate 145 mg daily for triglycerides

## 2023-08-27 DIAGNOSIS — M5441 Lumbago with sciatica, right side: Secondary | ICD-10-CM | POA: Diagnosis not present

## 2023-08-27 DIAGNOSIS — M9903 Segmental and somatic dysfunction of lumbar region: Secondary | ICD-10-CM | POA: Diagnosis not present

## 2023-09-16 DIAGNOSIS — M9903 Segmental and somatic dysfunction of lumbar region: Secondary | ICD-10-CM | POA: Diagnosis not present

## 2023-09-16 DIAGNOSIS — M5441 Lumbago with sciatica, right side: Secondary | ICD-10-CM | POA: Diagnosis not present

## 2023-10-08 DIAGNOSIS — M9903 Segmental and somatic dysfunction of lumbar region: Secondary | ICD-10-CM | POA: Diagnosis not present

## 2023-10-08 DIAGNOSIS — M5441 Lumbago with sciatica, right side: Secondary | ICD-10-CM | POA: Diagnosis not present

## 2023-10-20 DIAGNOSIS — L821 Other seborrheic keratosis: Secondary | ICD-10-CM | POA: Diagnosis not present

## 2023-10-20 DIAGNOSIS — D229 Melanocytic nevi, unspecified: Secondary | ICD-10-CM | POA: Diagnosis not present

## 2023-10-20 DIAGNOSIS — L578 Other skin changes due to chronic exposure to nonionizing radiation: Secondary | ICD-10-CM | POA: Diagnosis not present

## 2023-10-20 DIAGNOSIS — D239 Other benign neoplasm of skin, unspecified: Secondary | ICD-10-CM | POA: Diagnosis not present

## 2023-10-20 DIAGNOSIS — L814 Other melanin hyperpigmentation: Secondary | ICD-10-CM | POA: Diagnosis not present

## 2023-10-20 DIAGNOSIS — R202 Paresthesia of skin: Secondary | ICD-10-CM | POA: Diagnosis not present

## 2023-10-20 DIAGNOSIS — D1801 Hemangioma of skin and subcutaneous tissue: Secondary | ICD-10-CM | POA: Diagnosis not present

## 2023-10-27 ENCOUNTER — Other Ambulatory Visit: Payer: Self-pay | Admitting: Internal Medicine

## 2023-11-04 DIAGNOSIS — M9903 Segmental and somatic dysfunction of lumbar region: Secondary | ICD-10-CM | POA: Diagnosis not present

## 2023-11-04 DIAGNOSIS — M5441 Lumbago with sciatica, right side: Secondary | ICD-10-CM | POA: Diagnosis not present

## 2023-11-11 ENCOUNTER — Encounter: Payer: Self-pay | Admitting: Internal Medicine

## 2023-11-13 ENCOUNTER — Other Ambulatory Visit: Payer: Self-pay

## 2023-11-13 MED ORDER — SEMAGLUTIDE (1 MG/DOSE) 4 MG/3ML ~~LOC~~ SOPN: 1.0000 mg | PEN_INJECTOR | SUBCUTANEOUS | 0 refills | Status: DC

## 2023-11-25 ENCOUNTER — Other Ambulatory Visit: Payer: Self-pay | Admitting: Physician Assistant

## 2023-12-03 DIAGNOSIS — M5441 Lumbago with sciatica, right side: Secondary | ICD-10-CM | POA: Diagnosis not present

## 2023-12-03 DIAGNOSIS — M9903 Segmental and somatic dysfunction of lumbar region: Secondary | ICD-10-CM | POA: Diagnosis not present

## 2023-12-14 ENCOUNTER — Other Ambulatory Visit: Payer: Self-pay | Admitting: Internal Medicine

## 2023-12-24 DIAGNOSIS — M5441 Lumbago with sciatica, right side: Secondary | ICD-10-CM | POA: Diagnosis not present

## 2023-12-24 DIAGNOSIS — M9903 Segmental and somatic dysfunction of lumbar region: Secondary | ICD-10-CM | POA: Diagnosis not present

## 2024-01-08 ENCOUNTER — Other Ambulatory Visit: Payer: Self-pay | Admitting: Internal Medicine

## 2024-01-14 DIAGNOSIS — M9903 Segmental and somatic dysfunction of lumbar region: Secondary | ICD-10-CM | POA: Diagnosis not present

## 2024-01-14 DIAGNOSIS — M5441 Lumbago with sciatica, right side: Secondary | ICD-10-CM | POA: Diagnosis not present

## 2024-02-04 DIAGNOSIS — M9903 Segmental and somatic dysfunction of lumbar region: Secondary | ICD-10-CM | POA: Diagnosis not present

## 2024-02-04 DIAGNOSIS — M5441 Lumbago with sciatica, right side: Secondary | ICD-10-CM | POA: Diagnosis not present

## 2024-02-22 ENCOUNTER — Encounter: Payer: Self-pay | Admitting: Internal Medicine

## 2024-02-22 NOTE — Progress Notes (Unsigned)
 Subjective:    Patient ID: Virginia [name], female    DOB: January 08, 1941, 83 y.o.   MRN: [id number]     HPI Virginia [name] is here for follow up of her chronic medical problems.  Has some dizziness intermittently  - occurs daily.  Usually it is when she is walking.  It is a spinning sensation. It lasts a while.  Not with head movements.  She wonders if it is her sinuses-has chronic right maxillary sinus issues.  Balance is good.    Some leg exercises.  Very active.    Medications and allergies reviewed with patient and updated if appropriate.  Current Outpatient Medications on File Prior to Visit  Medication Sig Dispense Refill   Cholecalciferol (D3 HIGH POTENCY) 50 MCG (2000 UT) CAPS Take by mouth daily.     ezetimibe (ZETIA) 10 MG tablet TAKE 1 TABLET BY MOUTH EVERY DAY 90 tablet 3   fenofibrate (TRICOR) 145 MG tablet TAKE 1 TABLET BY MOUTH EVERY DAY 90 tablet 1   Loratadine (KS ALLERCLEAR PO) Take 1 tablet by mouth daily.     metoprolol tartrate (LOPRESSOR) 50 MG tablet TAKE 1 TABLET BY MOUTH TWICE A DAY 180 tablet 1   Multiple Vitamins-Minerals (AIRBORNE GUMMIES PO) Take 2 each by mouth daily.     pantoprazole (PROTONIX) 40 MG tablet Take 1 tablet (40 mg total) by mouth daily before breakfast. Take 30 to 60 minutes before a meal 90 tablet 1   Semaglutide,0.25 or 0.5MG /DOS, (OZEMPIC, 0.25 OR 0.5 MG/DOSE,) 2 MG/3ML SOPN Inject 0.5 mg into the skin once a week. 9 mL 1   Semaglutide, 1 MG/DOSE, (OZEMPIC, 1 MG/DOSE,) 4 MG/3ML SOPN INJECT 1 MG ONCE A WEEK AS DIRECTED (Patient not taking: Reported on 02/23/2024) 3 mL 0   No current facility-administered medications on file prior to visit.     Review of Systems  Constitutional:  Negative for fever.  HENT:  Positive for rhinorrhea and sinus pressure (mild right side). Negative for congestion and sore throat.   Respiratory:  Positive for cough (occ dry - has dry cough). Negative for shortness of breath and wheezing.   Cardiovascular:   Positive for palpitations (with anxiety at times). Negative for chest pain and leg swelling.  Neurological:  Positive for dizziness (intermittent, daily). Negative for light-headedness and headaches.       Objective:   Vitals:   02/23/24 1100  BP: (!) 140/78  Pulse: 76  Temp: 98.1 F (36.7 C)  SpO2: 96%   BP Readings from Last 3 Encounters:  02/23/24 (!) 140/78  08/26/23 126/74  05/15/23 116/72   Wt Readings from Last 3 Encounters:  02/23/24 168 lb 9.6 oz (76.5 kg)  08/26/23 171 lb (77.6 kg)  05/15/23 171 lb 4 oz (77.7 kg)   Body mass index is 29.87 kg/m.    Physical Exam Constitutional:      General: She is not in acute distress.    Appearance: Normal appearance.  HENT:     Head: Normocephalic and atraumatic.  Eyes:     Conjunctiva/sclera: Conjunctivae normal.  Cardiovascular:     Rate and Rhythm: Normal rate and regular rhythm.     Heart sounds: Normal heart sounds.  Pulmonary:     Effort: Pulmonary effort is normal. No respiratory distress.     Breath sounds: Normal breath sounds. No wheezing.  Musculoskeletal:     Cervical back: Neck supple.     Right lower leg: No edema.  Left lower leg: No edema.  Lymphadenopathy:     Cervical: No cervical adenopathy.  Skin:    General: Skin is warm and dry.     Findings: No rash.  Neurological:     Mental Status: She is alert. Mental status is at baseline.  Psychiatric:        Mood and Affect: Mood normal.        Behavior: Behavior normal.        Lab Results  Component Value Date   WBC 7.0 08/26/2023   HGB 14.4 08/26/2023   HCT 43.7 08/26/2023   PLT 203.0 08/26/2023   GLUCOSE 109 (H) 08/26/2023   CHOL 175 08/26/2023   TRIG 182.0 (H) 08/26/2023   HDL 46.20 08/26/2023   LDLDIRECT 134.0 02/18/2023   LDLCALC 92 08/26/2023   ALT 18 08/26/2023   AST 17 08/26/2023   NA 141 08/26/2023   K 3.9 08/26/2023   CL 105 08/26/2023   CREATININE 0.84 08/26/2023   BUN 20 08/26/2023   CO2 30 08/26/2023   TSH 2.06  02/18/2023   INR 1.0 05/25/2019   HGBA1C 5.7 08/26/2023   MICROALBUR <0.7 08/26/2023     Assessment & Plan:    See Problem List for Assessment and Plan of chronic medical problems.

## 2024-02-23 ENCOUNTER — Encounter: Payer: Self-pay | Admitting: Internal Medicine

## 2024-02-23 ENCOUNTER — Ambulatory Visit: Payer: Medicare PPO | Admitting: Internal Medicine

## 2024-02-23 VITALS — BP 140/78 | HR 76 | Temp 98.1°F | Ht 63.0 in | Wt 168.6 lb

## 2024-02-23 DIAGNOSIS — E785 Hyperlipidemia, unspecified: Secondary | ICD-10-CM | POA: Diagnosis not present

## 2024-02-23 DIAGNOSIS — R42 Dizziness and giddiness: Secondary | ICD-10-CM | POA: Insufficient documentation

## 2024-02-23 DIAGNOSIS — E119 Type 2 diabetes mellitus without complications: Secondary | ICD-10-CM

## 2024-02-23 DIAGNOSIS — Z7985 Long-term (current) use of injectable non-insulin antidiabetic drugs: Secondary | ICD-10-CM

## 2024-02-23 DIAGNOSIS — I10 Essential (primary) hypertension: Secondary | ICD-10-CM | POA: Diagnosis not present

## 2024-02-23 DIAGNOSIS — R0681 Apnea, not elsewhere classified: Secondary | ICD-10-CM

## 2024-02-23 LAB — COMPREHENSIVE METABOLIC PANEL
ALT: 15 U/L (ref 0–35)
AST: 16 U/L (ref 0–37)
Albumin: 4.2 g/dL (ref 3.5–5.2)
Alkaline Phosphatase: 52 U/L (ref 39–117)
BUN: 23 mg/dL (ref 6–23)
CO2: 30 meq/L (ref 19–32)
Calcium: 11.4 mg/dL — ABNORMAL HIGH (ref 8.4–10.5)
Chloride: 104 meq/L (ref 96–112)
Creatinine, Ser: 0.89 mg/dL (ref 0.40–1.20)
GFR: 60.93 mL/min (ref >60.00)
Glucose, Bld: 104 mg/dL — ABNORMAL HIGH (ref 70–99)
Potassium: 4.4 meq/L (ref 3.5–5.1)
Sodium: 140 meq/L (ref 135–145)
Total Bilirubin: 0.7 mg/dL (ref 0.2–1.2)
Total Protein: 6.7 g/dL (ref 6.0–8.3)

## 2024-02-23 LAB — HEMOGLOBIN A1C: Hgb A1c MFr Bld: 5.8 % (ref 4.6–6.5)

## 2024-02-23 LAB — LIPID PANEL
Cholesterol: 174 mg/dL (ref 0–200)
HDL: 42.9 mg/dL (ref >39.00)
LDL Cholesterol: 90 mg/dL (ref 0–99)
NonHDL: 130.84
Total CHOL/HDL Ratio: 4
Triglycerides: 206 mg/dL — ABNORMAL HIGH (ref 0.0–149.0)
VLDL: 41.2 mg/dL — ABNORMAL HIGH (ref 0.0–40.0)

## 2024-02-23 LAB — MICROALBUMIN / CREATININE URINE RATIO
Creatinine,U: 88 mg/dL
Microalb Creat Ratio: 10.9 mg/g (ref 0.0–30.0)
Microalb, Ur: 1 mg/dL (ref 0.0–1.9)

## 2024-02-23 MED ORDER — OZEMPIC (1 MG/DOSE) 4 MG/3ML ~~LOC~~ SOPN: PEN_INJECTOR | SUBCUTANEOUS | Status: DC

## 2024-02-23 NOTE — Assessment & Plan Note (Addendum)
 Chronic  Lab Results  Component Value Date   HGBA1C 5.7 08/26/2023   Sugars well controlled Check A1c, urine microalbumin Continue Ozempic--to start 1 mg weekly - will not go higher than this Stressed regular exercise, diabetic diet

## 2024-02-23 NOTE — Assessment & Plan Note (Signed)
 Referred to neurology - never made appt - deferred another referral - feels like she is sleeping well

## 2024-02-23 NOTE — Assessment & Plan Note (Signed)
 Chronic Regular exercise and healthy diet encouraged Check lipid panel today Statin intolerant-did not tolerate Crestor or pitavastatin Continue Zetia 10 mg daily Continue fenofibrate 145 mg daily for triglycerides-with continued weight loss and exercise, change in diet should be able to discontinue fenofibrate

## 2024-02-23 NOTE — Assessment & Plan Note (Addendum)
 Subacute Has been going on for a while and there is no obvious pattern Occurs daily, intermittent and may last a while Seems to be a true spinning sensation Not related to head movements Possibly sinus related She is going to switch her antihistamine If symptoms do not improve could refer to ENT-she will let me know

## 2024-02-23 NOTE — Patient Instructions (Addendum)
      Blood work was ordered.       Medications changes include :   None     Return in about 6 months (around 08/25/2024) for Physical Exam.

## 2024-02-23 NOTE — Assessment & Plan Note (Signed)
 Chronic Blood pressure adequately controlled CMP Continue metoprolol 50 mg twice daily

## 2024-02-24 ENCOUNTER — Encounter: Payer: Self-pay | Admitting: Internal Medicine

## 2024-02-24 NOTE — Addendum Note (Signed)
 Addended by: Pincus Sanes on: 02/24/2024 07:52 AM   Modules accepted: Orders

## 2024-02-25 DIAGNOSIS — M5441 Lumbago with sciatica, right side: Secondary | ICD-10-CM | POA: Diagnosis not present

## 2024-02-25 DIAGNOSIS — M9903 Segmental and somatic dysfunction of lumbar region: Secondary | ICD-10-CM | POA: Diagnosis not present

## 2024-03-07 DIAGNOSIS — L304 Erythema intertrigo: Secondary | ICD-10-CM | POA: Diagnosis not present

## 2024-03-14 DIAGNOSIS — Z1231 Encounter for screening mammogram for malignant neoplasm of breast: Secondary | ICD-10-CM | POA: Diagnosis not present

## 2024-03-14 LAB — HM MAMMOGRAPHY

## 2024-03-17 DIAGNOSIS — M5441 Lumbago with sciatica, right side: Secondary | ICD-10-CM | POA: Diagnosis not present

## 2024-03-17 DIAGNOSIS — M9903 Segmental and somatic dysfunction of lumbar region: Secondary | ICD-10-CM | POA: Diagnosis not present

## 2024-03-18 ENCOUNTER — Other Ambulatory Visit: Payer: Self-pay | Admitting: Internal Medicine

## 2024-04-11 ENCOUNTER — Other Ambulatory Visit

## 2024-04-12 ENCOUNTER — Emergency Department (HOSPITAL_BASED_OUTPATIENT_CLINIC_OR_DEPARTMENT_OTHER)

## 2024-04-12 ENCOUNTER — Encounter (HOSPITAL_BASED_OUTPATIENT_CLINIC_OR_DEPARTMENT_OTHER): Payer: Self-pay | Admitting: Urology

## 2024-04-12 ENCOUNTER — Other Ambulatory Visit: Payer: Self-pay

## 2024-04-12 ENCOUNTER — Emergency Department (HOSPITAL_BASED_OUTPATIENT_CLINIC_OR_DEPARTMENT_OTHER)
Admission: EM | Admit: 2024-04-12 | Discharge: 2024-04-12 | Disposition: A | Discharge status: Home / Self Care | Attending: Emergency Medicine | Admitting: Emergency Medicine

## 2024-04-12 ENCOUNTER — Other Ambulatory Visit: Payer: Self-pay | Admitting: Internal Medicine

## 2024-04-12 DIAGNOSIS — I10 Essential (primary) hypertension: Secondary | ICD-10-CM | POA: Diagnosis not present

## 2024-04-12 DIAGNOSIS — R42 Dizziness and giddiness: Secondary | ICD-10-CM

## 2024-04-12 DIAGNOSIS — R000 Tachycardia, unspecified: Secondary | ICD-10-CM | POA: Diagnosis not present

## 2024-04-12 DIAGNOSIS — E119 Type 2 diabetes mellitus without complications: Secondary | ICD-10-CM | POA: Diagnosis not present

## 2024-04-12 DIAGNOSIS — I700 Atherosclerosis of aorta: Secondary | ICD-10-CM | POA: Diagnosis not present

## 2024-04-12 DIAGNOSIS — I4891 Unspecified atrial fibrillation: Secondary | ICD-10-CM | POA: Insufficient documentation

## 2024-04-12 DIAGNOSIS — I517 Cardiomegaly: Secondary | ICD-10-CM | POA: Diagnosis not present

## 2024-04-12 LAB — BASIC METABOLIC PANEL WITH GFR
Anion gap: 12 (ref 5–15)
BUN: 21 mg/dL (ref 8–23)
CO2: 23 mmol/L (ref 22–32)
Calcium: 11.4 mg/dL — ABNORMAL HIGH (ref 8.9–10.3)
Chloride: 105 mmol/L (ref 98–111)
Creatinine, Ser: 0.92 mg/dL (ref 0.44–1.00)
GFR, Estimated: >60 mL/min (ref >60)
Glucose, Bld: 173 mg/dL — ABNORMAL HIGH (ref 70–99)
Potassium: 3.7 mmol/L (ref 3.5–5.1)
Sodium: 140 mmol/L (ref 135–145)

## 2024-04-12 LAB — CBC
HCT: 45.8 % (ref 36.0–46.0)
Hemoglobin: 15.1 g/dL — ABNORMAL HIGH (ref 12.0–15.0)
MCH: 29.3 pg (ref 26.0–34.0)
MCHC: 33 g/dL (ref 30.0–36.0)
MCV: 88.8 fL (ref 80.0–100.0)
Platelets: 213 10*3/uL (ref 150–400)
RBC: 5.16 MIL/uL — ABNORMAL HIGH (ref 3.87–5.11)
RDW: 13.5 % (ref 11.5–15.5)
WBC: 7.4 10*3/uL (ref 4.0–10.5)
nRBC: 0 % (ref 0.0–0.2)

## 2024-04-12 LAB — TROPONIN T, HIGH SENSITIVITY: Troponin T High Sensitivity: <15 ng/L (ref <19)

## 2024-04-12 MED ORDER — DILTIAZEM HCL ER COATED BEADS 120 MG PO CP24: 120.0000 mg | ORAL_CAPSULE | Freq: Every day | ORAL | 0 refills | Status: DC

## 2024-04-12 MED ORDER — DILTIAZEM HCL 30 MG PO TABS
120.0000 mg | ORAL_TABLET | Freq: Once | ORAL | Status: AC
  Administered 2024-04-12: 120 mg via ORAL
  Filled 2024-04-12: qty 4

## 2024-04-12 MED ORDER — RIVAROXABAN (XARELTO) VTE STARTER PACK (15 & 20 MG): ORAL_TABLET | ORAL | 0 refills | Status: DC

## 2024-04-12 MED ORDER — RIVAROXABAN 20 MG PO TABS
20.0000 mg | ORAL_TABLET | Freq: Once | ORAL | Status: AC
  Administered 2024-04-12: 20 mg via ORAL
  Filled 2024-04-12: qty 1

## 2024-04-12 NOTE — ED Provider Notes (Incomplete)
 [place] [facility] Provider Note   CSN: [id number] Arrival date & time: 04/12/24  1951     History {Add pertinent medical, surgical, social history, OB history to HPI:1} Chief Complaint  Patient presents with  . Tachycardia    Virginia [name] is a 83 y.o. female.  Virginia [name] is an 75 F with a medical history of T2DM, hypertension, and sleep apnea who presents for fast heart rate and dizziness. She is accompanied by a friend and a family member. Patient states she has been feeling like her heart has been racing for the past week. More recently she has felt some dizziness, especially with activity. Denies any chest pain, shortness of breath, confusion, falls, recent illness, sick contacts, or recent changes to medications. Takes metoprolol  50 mg BID for hypertension, has been taking this dose for almost 20 years. Also endorses taking medications for cholesterol, allergies, and reflux. Has experienced dizziness in the past but denies personal history of heart attack or stroke.         Home Medications Prior to Admission medications   Medication Sig Start Date End Date Taking? Authorizing Provider  Cholecalciferol (D3 HIGH POTENCY) 50 MCG (2000 UT) CAPS Take by mouth daily.    [provider]  ezetimibe  (ZETIA ) 10 MG tablet TAKE 1 TABLET BY MOUTH EVERY DAY 08/10/23   Colene Dauphin, MD  fenofibrate  (TRICOR ) 145 MG tablet TAKE 1 TABLET BY MOUTH EVERY DAY 01/08/24   Colene Dauphin, MD  Loratadine  (KS ALLERCLEAR PO) Take 1 tablet by mouth daily.    [provider]  metoprolol  tartrate (LOPRESSOR ) 50 MG tablet TAKE 1 TABLET BY MOUTH TWICE A DAY 10/27/23   Burns, Beckey Bourgeois, MD  Multiple Vitamins-Minerals (AIRBORNE GUMMIES PO) Take 2 each by mouth daily.    [provider]  pantoprazole  (PROTONIX ) 40 MG tablet Take 1 tablet (40 mg total) by mouth daily before breakfast. Take 30 to 60 minutes before a meal 11/26/23    Santina Cull R, PA-C  Semaglutide , 1 MG/DOSE, (OZEMPIC , 1 MG/DOSE,) 4 MG/3ML SOPN INJECT 1 MG ONCE A WEEK AS DIRECTED 04/12/24   Colene Dauphin, MD      Allergies    Penicillins, Statins, and Bactrim  [sulfamethoxazole -trimethoprim ]    Review of Systems   Review of Systems  Physical Exam Updated Vital Signs BP (!) 152/82   Pulse 90   Temp 97.8 F (36.6 C)   Resp 16   Ht 5\' 3"  (1.6 m)   Wt 76.5 kg   SpO2 95%   BMI 29.88 kg/m  Physical Exam HENT:     Head: Normocephalic and atraumatic.  Cardiovascular:     Rate and Rhythm: Tachycardia present. Rhythm irregular.     Pulses: Normal pulses.     Heart sounds: Normal heart sounds.  Pulmonary:     Effort: Pulmonary effort is normal.     Breath sounds: Normal breath sounds.  Abdominal:     General: Bowel sounds are normal.     Palpations: Abdomen is soft.  Skin:    General: Skin is warm.  Neurological:     General: No focal deficit present.     Mental Status: She is alert.  Psychiatric:        Mood and Affect: Mood normal.     ED Results / Procedures / Treatments   Labs (all labs ordered are listed, but only abnormal results are displayed) Labs Reviewed  BASIC METABOLIC PANEL WITH GFR -  Abnormal; Notable for the following components:      Result Value   Glucose, Bld 173 (*)    Calcium  11.4 (*)    All other components within normal limits  CBC - Abnormal; Notable for the following components:   RBC 5.16 (*)    Hemoglobin 15.1 (*)    All other components within normal limits  TROPONIN T, HIGH SENSITIVITY    EKG EKG Interpretation Date/Time:  Tuesday Apr 12 2024 20:01:40 EDT Ventricular Rate:  114 PR Interval:  113 QRS Duration:  90 QT Interval:  346 QTC Calculation: 477 R Axis:   -19  Text Interpretation: Atrial fibrillation Borderline left axis deviation Abnormal R-wave progression, late transition Borderline repolarization abnormality rate slower since earlier in the day Confirmed by Florette Hurry  [phone]) on 04/12/2024 8:20:49 PM  Radiology DG Chest Portable 1 View Result Date: 04/12/2024 CLINICAL DATA:  Tachycardia EXAM: PORTABLE CHEST 1 VIEW COMPARISON:  05/25/2019 FINDINGS: Mild cardiomegaly. No acute airspace disease, pleural effusion, or pneumothorax. Aortic atherosclerosis IMPRESSION: No active disease. Mild cardiomegaly. Electronically Signed   By: Esmeralda Hedge M.D.   On: 04/12/2024 20:27    Procedures Procedures  {Document cardiac monitor, telemetry assessment procedure when appropriate:1}  Medications Ordered in ED Medications  rivaroxaban (XARELTO) tablet 20 mg (has no administration in time range)  diltiazem (CARDIZEM) tablet 120 mg (120 mg Oral Given 04/12/24 2035)    ED Course/ Medical Decision Making/ A&P   {   Click here for ABCD2, HEART and other calculatorsREFRESH Note before signing :1}                              Medical Decision Making   Cardizem CD 120 mg daily, Xarelto starter pack, Cardiology follow up, PCP follow up   Amount and/or Complexity of Data Reviewed Labs: ordered. Radiology: ordered.  Risk Prescription drug management.    {Document critical care time when appropriate:1} {Document review of labs and clinical decision tools ie heart score, Chads2Vasc2 etc:1}  {Document your independent review of radiology images, and any outside records:1} {Document your discussion with family members, caretakers, and with consultants:1} {Document social determinants of health affecting pt's care:1} {Document your decision making why or why not admission, treatments were needed:1} Final Clinical Impression(s) / ED Diagnoses Final diagnoses:  None    Rx / DC Orders ED Discharge Orders     None

## 2024-04-12 NOTE — ED Triage Notes (Signed)
 Pt states she feels like her heart is beating fast, was 136 at home with associated dizziness that started last week  No h/o cardiac concerns  Denies pain or SOB

## 2024-04-12 NOTE — Discharge Instructions (Addendum)
 You were seen for dizziness and associated rapid heart beat, and found to have an abnormal rhythm called atrial fibrillation. You were treated with a medication that helps to slow down your heart rate and a medication to help prevent clotting. You will continue to take these medications starting tomorrow. Please STOP taking your metoprolol  as this has a similar mechanism of action to the diltiazem that has been prescribed.   You will need to have outpatient follow up with cardiology for further workup. A referral has been placed, please expect a call over the next week. If you do not receive a call within the next week or so please have your primary care doctor look into the referral.   We recommend that you continue to talk to your primary care doctor as you have a history of gastrointestinal bleeding and one of the medications you will start taking can cause increased risk of bleeding.   Please return to the ER if you experience any signs of stroke (headache, confusion, speech changes, weakness, etc.), heart attack (chest pain, arm tingling, fatigue, palpitations, sweating, etc), or bleeding (vomiting blood, blood in the urine, or blood in the stool).

## 2024-04-12 NOTE — ED Provider Notes (Signed)
 [place] [facility] Provider Note   CSN: [id number] Arrival date & time: 04/12/24  1951     History {Add pertinent medical, surgical, social history, OB history to HPI:1} Chief Complaint  Patient presents with   Tachycardia    Virginia [name] is a 83 y.o. female.  HPI     Home Medications Prior to Admission medications   Medication Sig Start Date End Date Taking? Authorizing Provider  Cholecalciferol (D3 HIGH POTENCY) 50 MCG (2000 UT) CAPS Take by mouth daily.    [provider]  ezetimibe  (ZETIA ) 10 MG tablet TAKE 1 TABLET BY MOUTH EVERY DAY 08/10/23   Colene Dauphin, MD  fenofibrate  (TRICOR ) 145 MG tablet TAKE 1 TABLET BY MOUTH EVERY DAY 01/08/24   Colene Dauphin, MD  Loratadine  (KS ALLERCLEAR PO) Take 1 tablet by mouth daily.    [provider]  metoprolol  tartrate (LOPRESSOR ) 50 MG tablet TAKE 1 TABLET BY MOUTH TWICE A DAY 10/27/23   Colene Dauphin, MD  Multiple Vitamins-Minerals (AIRBORNE GUMMIES PO) Take 2 each by mouth daily.    [provider]  pantoprazole  (PROTONIX ) 40 MG tablet Take 1 tablet (40 mg total) by mouth daily before breakfast. Take 30 to 60 minutes before a meal 11/26/23   Santina Cull R, PA-C  Semaglutide , 1 MG/DOSE, (OZEMPIC , 1 MG/DOSE,) 4 MG/3ML SOPN INJECT 1 MG ONCE A WEEK AS DIRECTED 04/12/24   Colene Dauphin, MD      Allergies    Penicillins, Statins, and Bactrim  [sulfamethoxazole -trimethoprim ]    Review of Systems   Review of Systems  Physical Exam Updated Vital Signs BP (!) 152/82   Pulse 90   Temp 97.8 F (36.6 C)   Resp 16   Ht 5\' 3"  (1.6 m)   Wt 76.5 kg   SpO2 95%   BMI 29.88 kg/m  Physical Exam HENT:     Head: Normocephalic and atraumatic.  Cardiovascular:     Rate and Rhythm: Tachycardia present. Rhythm irregular.     Pulses: Normal pulses.     Heart sounds: Normal heart sounds.  Pulmonary:     Effort: Pulmonary effort is normal.     Breath sounds: Normal  breath sounds.  Abdominal:     General: Bowel sounds are normal.     Palpations: Abdomen is soft.  Skin:    General: Skin is warm.  Neurological:     General: No focal deficit present.     Mental Status: She is alert.  Psychiatric:        Mood and Affect: Mood normal.     ED Results / Procedures / Treatments   Labs (all labs ordered are listed, but only abnormal results are displayed) Labs Reviewed  BASIC METABOLIC PANEL WITH GFR - Abnormal; Notable for the following components:      Result Value   Glucose, Bld 173 (*)    Calcium  11.4 (*)    All other components within normal limits  CBC - Abnormal; Notable for the following components:   RBC 5.16 (*)    Hemoglobin 15.1 (*)    All other components within normal limits  TROPONIN T, HIGH SENSITIVITY    EKG EKG Interpretation Date/Time:  Tuesday Apr 12 2024 20:01:40 EDT Ventricular Rate:  114 PR Interval:  113 QRS Duration:  90 QT Interval:  346 QTC Calculation: 477 R Axis:   -19  Text Interpretation: Atrial fibrillation Borderline left axis deviation Abnormal R-wave progression, late transition Borderline  repolarization abnormality rate slower since earlier in the day Confirmed by Florette Hurry ([id number]) on 04/12/2024 8:20:49 PM  Radiology DG Chest Portable 1 View Result Date: 04/12/2024 CLINICAL DATA:  Tachycardia EXAM: PORTABLE CHEST 1 VIEW COMPARISON:  05/25/2019 FINDINGS: Mild cardiomegaly. No acute airspace disease, pleural effusion, or pneumothorax. Aortic atherosclerosis IMPRESSION: No active disease. Mild cardiomegaly. Electronically Signed   By: Esmeralda Hedge M.D.   On: 04/12/2024 20:27    Procedures Procedures  {Document cardiac monitor, telemetry assessment procedure when appropriate:1}  Medications Ordered in ED Medications  rivaroxaban (XARELTO) tablet 20 mg (has no administration in time range)  diltiazem (CARDIZEM) tablet 120 mg (120 mg Oral Given 04/12/24 2035)    ED Course/ Medical Decision Making/  A&P   {   Click here for ABCD2, HEART and other calculatorsREFRESH Note before signing :1}                              Medical Decision Making Cardizem CD 120 mg daily, Xarelto starter pack, Cardiology follow up, PCP follow up   Amount and/or Complexity of Data Reviewed Labs: ordered. Radiology: ordered.  Risk Prescription drug management.    {Document critical care time when appropriate:1} {Document review of labs and clinical decision tools ie heart score, Chads2Vasc2 etc:1}  {Document your independent review of radiology images, and any outside records:1} {Document your discussion with family members, caretakers, and with consultants:1} {Document social determinants of health affecting pt's care:1} {Document your decision making why or why not admission, treatments were needed:1} Final Clinical Impression(s) / ED Diagnoses Final diagnoses:  None    Rx / DC Orders ED Discharge Orders     None

## 2024-04-13 LAB — PTH, INTACT AND CALCIUM
Calcium: 10.7 mg/dL — ABNORMAL HIGH (ref 8.6–10.4)
PTH: 25 pg/mL (ref 16–77)

## 2024-04-13 NOTE — Progress Notes (Signed)
 Pharmacy Medication Note  Xarelto VTE starter pack was inadvertently prescribed for afib. Patient had not yet picked up. Obtained a verbal from Hershel Los, MD to change to xarelto 15mg  PO once daily. Called pharmacy and discontinued starter pack and replaced with reduced dose.   Jerri Morale, PharmD, BCPS, BCEMP Clinical Pharmacist Please see AMION for all pharmacy numbers 04/13/2024 8:24 AM

## 2024-04-14 ENCOUNTER — Ambulatory Visit: Payer: Self-pay | Admitting: Internal Medicine

## 2024-04-17 ENCOUNTER — Encounter: Payer: Self-pay | Admitting: Internal Medicine

## 2024-04-17 DIAGNOSIS — I4891 Unspecified atrial fibrillation: Secondary | ICD-10-CM | POA: Insufficient documentation

## 2024-04-17 NOTE — Patient Instructions (Addendum)
      Monitor your BP and heart rate at home and keep a log.      Medications changes include :   None    A referral was ordered for Endocrine and someone will call you to schedule an appointment.

## 2024-04-17 NOTE — Progress Notes (Signed)
 Subjective:    Patient ID: Virginia [name], female    DOB: [birth year], 83 y.o.   MRN: [id number]     HPI Virginia [name] is here for follow up from the ED.    ED  5/13 for heart racing intermittently and dizziness.  She felt like this was happening over the past week.  She denies chest pain, shortness of breath.  EKG showed atrial fibrillation at 114 bpm.  She received Cardizem  and Xarelto .  Chest x-ray negative.  Referral ordered for cardiology.  She was started on Xarelto  starter pack and Cardizem  CD 120 mg daily.  She was advised to hold her metoprolol .  HR 80-92 at home.  Has not checked BP.  Not having any heart racing, chest pain.  Having some SOB.  Going to [place]  in 2 weeks.     Medications and allergies reviewed with patient and updated if appropriate.  Current Outpatient Medications on File Prior to Visit  Medication Sig Dispense Refill   Cholecalciferol (D3 HIGH POTENCY) 50 MCG (2000 UT) CAPS Take by mouth daily.     diltiazem  (CARDIZEM  CD) 120 MG 24 hr capsule Take 1 capsule (120 mg total) by mouth daily. 30 capsule 0   ezetimibe  (ZETIA ) 10 MG tablet TAKE 1 TABLET BY MOUTH EVERY DAY 90 tablet 3   fenofibrate  (TRICOR ) 145 MG tablet TAKE 1 TABLET BY MOUTH EVERY DAY 90 tablet 1   ketoconazole (NIZORAL) 2 % cream Apply topically daily.     Loratadine  (KS ALLERCLEAR PO) Take 1 tablet by mouth daily.     Multiple Vitamins-Minerals (AIRBORNE GUMMIES PO) Take 2 each by mouth daily.     pantoprazole  (PROTONIX ) 40 MG tablet Take 1 tablet (40 mg total) by mouth daily before breakfast. Take 30 to 60 minutes before a meal 90 tablet 1   RIVAROXABAN  (XARELTO ) VTE STARTER PACK (15 & 20 MG) Follow package directions: Take one 15mg  tablet by mouth twice a day. On day 22, switch to one 20mg  tablet once a day. Take with food. 51 each 0   Semaglutide , 1 MG/DOSE, (OZEMPIC , 1 MG/DOSE,) 4 MG/3ML SOPN INJECT 1 MG ONCE A WEEK AS DIRECTED 3 mL 0   triamcinolone  ointment (KENALOG) 0.1 % 1  Application.     metoprolol  tartrate (LOPRESSOR ) 50 MG tablet TAKE 1 TABLET BY MOUTH TWICE A DAY (Patient not taking: Reported on 04/18/2024) 180 tablet 1   No current facility-administered medications on file prior to visit.     Review of Systems  Constitutional:  Negative for fever.  Respiratory:  Positive for cough (dry) and shortness of breath. Negative for chest tightness and wheezing.   Cardiovascular:  Negative for chest pain, palpitations and leg swelling.  Gastrointestinal:  Negative for abdominal pain and nausea.       No gerd  Neurological:  Positive for dizziness, light-headedness and headaches (intermittent - mild, left sided).       Objective:   Vitals:   04/18/24 1314  BP: (!) 140/70  Pulse: 82  Temp: 97.8 F (36.6 C)  SpO2: 96%   BP Readings from Last 3 Encounters:  04/18/24 (!) 140/70  04/12/24 (!) 154/81  02/23/24 (!) 140/78   Wt Readings from Last 3 Encounters:  04/18/24 165 lb (74.8 kg)  04/12/24 168 lb 10.4 oz (76.5 kg)  02/23/24 168 lb 9.6 oz (76.5 kg)   Body mass index is 29.23 kg/m.    Physical Exam Constitutional:      General: She is not  in acute distress.    Appearance: Normal appearance.  HENT:     Head: Normocephalic and atraumatic.  Eyes:     Conjunctiva/sclera: Conjunctivae normal.  Cardiovascular:     Rate and Rhythm: Normal rate and regular rhythm.     Heart sounds: Normal heart sounds.  Pulmonary:     Effort: Pulmonary effort is normal. No respiratory distress.     Breath sounds: Normal breath sounds. No wheezing.  Musculoskeletal:     Cervical back: Neck supple.     Right lower leg: No edema.     Left lower leg: No edema.  Lymphadenopathy:     Cervical: No cervical adenopathy.  Skin:    General: Skin is warm and dry.     Findings: No rash.  Neurological:     Mental Status: She is alert. Mental status is at baseline.  Psychiatric:        Mood and Affect: Mood normal.        Behavior: Behavior normal.        Lab  Results  Component Value Date   WBC 7.4 04/12/2024   HGB 15.1 (H) 04/12/2024   HCT 45.8 04/12/2024   PLT 213 04/12/2024   GLUCOSE 173 (H) 04/12/2024   CHOL 174 02/23/2024   TRIG 206.0 (H) 02/23/2024   HDL 42.90 02/23/2024   LDLDIRECT 134.0 02/18/2023   LDLCALC 90 02/23/2024   ALT 15 02/23/2024   AST 16 02/23/2024   NA 140 04/12/2024   K 3.7 04/12/2024   CL 105 04/12/2024   CREATININE 0.92 04/12/2024   BUN 21 04/12/2024   CO2 23 04/12/2024   TSH 2.06 02/18/2023   INR 1.0 05/25/2019   HGBA1C 5.8 02/23/2024   MICROALBUR 1.0 02/23/2024   DG Chest Portable 1 View CLINICAL DATA:  Tachycardia  EXAM: PORTABLE CHEST 1 VIEW  COMPARISON:  05/25/2019  FINDINGS: Mild cardiomegaly. No acute airspace disease, pleural effusion, or pneumothorax. Aortic atherosclerosis  IMPRESSION: No active disease. Mild cardiomegaly.  Electronically Signed   By: Esmeralda Hedge M.D.   On: 04/12/2024 20:27    Assessment & Plan:    See Problem List for Assessment and Plan of chronic medical problems.

## 2024-04-18 ENCOUNTER — Ambulatory Visit: Admitting: Internal Medicine

## 2024-04-18 VITALS — BP 136/74 | HR 82 | Temp 97.8°F | Ht 63.0 in | Wt 165.0 lb

## 2024-04-18 DIAGNOSIS — I4891 Unspecified atrial fibrillation: Secondary | ICD-10-CM

## 2024-04-18 DIAGNOSIS — J309 Allergic rhinitis, unspecified: Secondary | ICD-10-CM

## 2024-04-18 DIAGNOSIS — I10 Essential (primary) hypertension: Secondary | ICD-10-CM | POA: Diagnosis not present

## 2024-04-18 NOTE — Assessment & Plan Note (Signed)
 Chronic Calcium  persistently high Not taking any calcium  supplements PTH normal Referral to endocrine for further evaluation

## 2024-04-18 NOTE — Assessment & Plan Note (Addendum)
 Chronic Burning eyes b/l No runny nose, sore throat now Allergic conjunctivitis vs dry eyes Can try otc lubricant or allergy eye drops to see if either help If no improvement see eye doctor

## 2024-04-18 NOTE — Assessment & Plan Note (Signed)
 New On cardizem  CD 120 mg daily, xarelto  starter pak No sie effects Denies any CP, palpitations Tolerating medications well Advised to monitor BP, HR and keep log No change in medications Continue above medications Will see cardiology

## 2024-04-18 NOTE — Assessment & Plan Note (Signed)
 Chronic Blood pressure adequately controlled Continue cardizem  cd 120 mg daily Monitor BP, HR at home

## 2024-04-20 DIAGNOSIS — M9903 Segmental and somatic dysfunction of lumbar region: Secondary | ICD-10-CM | POA: Diagnosis not present

## 2024-04-20 DIAGNOSIS — M5441 Lumbago with sciatica, right side: Secondary | ICD-10-CM | POA: Diagnosis not present

## 2024-04-24 ENCOUNTER — Other Ambulatory Visit: Payer: Self-pay | Admitting: Internal Medicine

## 2024-05-04 ENCOUNTER — Ambulatory Visit: Attending: Cardiology | Admitting: Cardiology

## 2024-05-04 ENCOUNTER — Encounter: Payer: Self-pay | Admitting: Cardiology

## 2024-05-04 VITALS — BP 150/74 | HR 84 | Ht 63.0 in | Wt 164.4 lb

## 2024-05-04 DIAGNOSIS — R0609 Other forms of dyspnea: Secondary | ICD-10-CM | POA: Diagnosis not present

## 2024-05-04 DIAGNOSIS — R0683 Snoring: Secondary | ICD-10-CM | POA: Diagnosis not present

## 2024-05-04 DIAGNOSIS — I4719 Other supraventricular tachycardia: Secondary | ICD-10-CM | POA: Diagnosis not present

## 2024-05-04 MED ORDER — METOPROLOL TARTRATE 50 MG PO TABS: 50.0000 mg | ORAL_TABLET | Freq: Two times a day (BID) | ORAL | 3 refills | Status: DC

## 2024-05-04 NOTE — Progress Notes (Signed)
 Cardiology Office Note:  .   Date:  05/04/2024  ID:  Virginia [name], DOB Dec 11, 1940, MRN [id number] PCP: Colene Dauphin, MD  [facility] Providers Cardiologist:  Fransico Ivy, MD PCP: Colene Dauphin, MD  Chief Complaint  Patient presents with   Atrial Fibrillation   Follow-up   Dizziness     Virginia [name] is a 83 y.o. female with hypertension, type 2 diabetes mellitus, OSA, h/o GI bleed, with palpitations History of Present Illness  Patient is a retired [employer] loss adjuster, stays active with walking, as well as social engagement.  She is here with her friend today.  Patient was seen in [facility] emergency room on 04/12/2024 with complaints of palpitations and dizziness.  I have reviewed all available EKGs from 04/12/2024, with my interpretation below.  She was thought to be in A-fib, and started on Xarelto .  It appears that she was started on Xarelto  starter pack with PE dosing with 50 mg twice daily for 3 weeks followed by 20 mg daily.  However, she has been taking Xarelto  15 mg daily.  Of note, patient has had history of acute blood loss anemia with hemoglobin down to 5.4 back in 2020.  EGD at that time showed esophagitis and gastritis treated with PPI.  She also had a few polyps noted on colonoscopy in 2023.  Fortunately, she has not had any recurrent GI bleeding since then.  Patient has not had any recurrence of palpitation symptoms since her ER visit.  However, she has noted similar palpitations in the past with sudden onset and sudden resolution.  Sometimes, she notices this occurs with stress.  Separately, she reports mild exertional dyspnea, but denies any chest pain.  She does endorse snoring at night.   Vitals:   05/04/24 0953  BP: (!) 150/74  Pulse: 84  SpO2: 98%      Review of Systems  Cardiovascular:  Positive for dyspnea on exertion and palpitations. Negative for chest pain, leg swelling and syncope.        Studies Reviewed: Virginia [name]         Independently interpreted EKGs dated 04/12/2024: Per my read, shows narrow complex tachycardia-likely atrial tachycardia, with conversion to sinus rhythm.  Independently. Labs (ordered by ER) 03/2024: Chol 174, TG 206, HDL 42, LDL 90 HbA1C 5.8% Hb 15.1 Cr 0.92 Trop HS <15  Risk Assessment/Calculations:    CHA2DS2-VASc Score = 6   This indicates a 9.7% annual risk of stroke. The patient's score is based upon: CHF History: 1 HTN History: 1 Diabetes History: 1 Stroke History: 0 Vascular Disease History: 0 Age Score: 2 Gender Score: 1     Physical Exam Vitals and nursing note reviewed.  Constitutional:      General: She is not in acute distress. Neck:     Vascular: No JVD.  Cardiovascular:     Rate and Rhythm: Normal rate and regular rhythm.     Heart sounds: Normal heart sounds. No murmur heard. Pulmonary:     Effort: Pulmonary effort is normal.     Breath sounds: Normal breath sounds. No wheezing or rales.  Musculoskeletal:     Right lower leg: No edema.     Left lower leg: No edema.     VISIT DIAGNOSES:   ICD-10-CM   1. Snoring  R06.83 Ambulatory referral to Sleep Studies    2. Atrial tachycardia (HCC)  I47.19 ECHOCARDIOGRAM COMPLETE    Cardiac event monitor    EXERCISE TOLERANCE TEST (ETT)    3.  DOE (dyspnea on exertion)  R06.09 ECHOCARDIOGRAM COMPLETE       Virginia [name] is a 83 y.o. female with hypertension, type 2 diabetes mellitus, OSA, h/o GI bleed, with palpitations Assessment & Plan  Palpitations: I have reviewed and personally interpreted 3 EKGs performed in the ER on 04/12/2024.  First 2 EKG shows narrow complex tachycardia, likely atrial tachycardia.  Last EKG shows atrial tachycardia, followed by conversion to sinus rhythm.  I do not think either of these EKGs show A-fib.  As such, there is no documented evidence of atrial fibrillation in my opinion.  I discussed this with patient.  While atrial fibrillation would be ordered  anticoagulation given her high CHA2DS2-VASc 4, atrial tachycardia does not for anticoagulation.  I did discuss the risks and benefits of stopping anticoagulation with the patient.  In case of atrial fibrillation, stopping anticoagulation can increase stroke of risk.  However, in absence of any evidence of A-fib, and her history of GI bleeding, would recommend stopping anticoagulation.  Patient is in agreement with this decision.  I will place her on 30-day monitor to get more information about her arrhythmia.  I will also obtain echocardiogram, and exercise treadmill stress test to look for any exercise-induced arrhythmias.  Given her OSA, but not current treatment with CPAP, I would recommend sleep study.  Finally, it appears that patient's blood pressure was better controlled with metoprolol  50 mg twice daily.  She is also noticed dizziness since starting diltiazem .  Therefore, we will switch her back to metoprolol  50 mg twice daily in place of diltiazem .  Statin intolerance: Patient is diabetic, but has not tolerated statins in the past due to myalgias.  Therefore, she is currently on Zetia  and Tricor  with fairly well-controlled LDL, and mildly elevated triglyceride.  No change made today by me.   Informed Consent   Shared Decision Making/Informed Consent The risks [chest pain, shortness of breath, cardiac arrhythmias, dizziness, blood pressure fluctuations, myocardial infarction, stroke/transient ischemic attack, and life-threatening complications (estimated to be 1 in 10,000)], benefits (risk stratification, diagnosing coronary artery disease, treatment guidance) and alternatives of an exercise tolerance test were discussed in detail with Virginia [name] and she agrees to proceed.       Meds ordered this encounter  Medications   metoprolol  tartrate (LOPRESSOR ) 50 MG tablet    Sig: Take 1 tablet (50 mg total) by mouth 2 (two) times daily.    Dispense:  180 tablet    Refill:  3     F/u in 6-8  weeks  Signed, Cody Das, MD

## 2024-05-04 NOTE — Patient Instructions (Addendum)
 Medication Instructions:  STOP Diltiazem   STOP Xarelto    START Metoprolol  Tartrate 50 mg twice daily   *If you need a refill on your cardiac medications before your next appointment, please call your pharmacy*   Testing/Procedures: Echo  Your physician has requested that you have an echocardiogram. Echocardiography is a painless test that uses sound waves to create images of your heart. It provides your doctor with information about the size and shape of your heart and how well your heart's chambers and valves are working. This procedure takes approximately one hour. There are no restrictions for this procedure. Please do NOT wear cologne, perfume, aftershave, or lotions (deodorant is allowed). Please arrive 15 minutes prior to your appointment time.  Please note: We ask at that you not bring children with you during ultrasound (echo/ vascular) testing. Due to room size and safety concerns, children are not allowed in the ultrasound rooms during exams. Our front office staff cannot provide observation of children in our lobby area while testing is being conducted. An adult accompanying a patient to their appointment will only be allowed in the ultrasound room at the discretion of the ultrasound technician under special circumstances. We apologize for any inconvenience.  30 day monitor   Your physician has recommended that you wear an event monitor. Event monitors are medical devices that record the heart's electrical activity. Doctors most often us  these monitors to diagnose arrhythmias. Arrhythmias are problems with the speed or rhythm of the heartbeat. The monitor is a small, portable device. You can wear one while you do your normal daily activities. This is usually used to diagnose what is causing palpitations/syncope (passing out).  GXT  Exercise Tolerance Test  Please arrive 15 minutes prior to your appointment time for registration and insurance purposes.  The test will take  approximately 45 minutes to complete.  How to prepare for your Exercise Stress Test: Do bring a list of your current medications with you.  If not listed below, you may take your medications as normal. HOLD METOPROLOL  24 HOURS BEFORE TEST Do wear comfortable clothes (no dresses or overalls) and walking shoes, tennis shoes preferred (no heels or open toed shoes are allowed) Do Not wear cologne, perfume, aftershave or lotions (deodorant is allowed). Please report to [address] for your test.  If these instructions are not followed, your test will have to be rescheduled.  If you have questions or concerns about your appointment, you can call the Stress Lab at [phone].  If you cannot keep your appointment, please provide 24 hours notification to the Stress Lab, to avoid a possible $50 charge to your account.   REFERRAL FOR SLEEP STUDY   Follow-Up: At [facility], you and your health needs are our priority.  As part of our continuing mission to provide you with exceptional heart care, our providers are all part of one team.  This team includes your primary Cardiologist (physician) and Advanced Practice Providers or APPs (Physician Assistants and Nurse Practitioners) who all work together to provide you with the care you need, when you need it.  Your next appointment:   6-8 week(s)  Provider:   Cody Das, MD

## 2024-05-07 ENCOUNTER — Other Ambulatory Visit: Payer: Self-pay | Admitting: Internal Medicine

## 2024-05-09 ENCOUNTER — Emergency Department (HOSPITAL_BASED_OUTPATIENT_CLINIC_OR_DEPARTMENT_OTHER)
Admission: EM | Admit: 2024-05-09 | Discharge: 2024-05-09 | Disposition: A | Discharge status: Home / Self Care | Attending: Emergency Medicine | Admitting: Emergency Medicine

## 2024-05-09 ENCOUNTER — Encounter (HOSPITAL_BASED_OUTPATIENT_CLINIC_OR_DEPARTMENT_OTHER): Payer: Self-pay

## 2024-05-09 ENCOUNTER — Other Ambulatory Visit: Payer: Self-pay

## 2024-05-09 DIAGNOSIS — Z79899 Other long term (current) drug therapy: Secondary | ICD-10-CM | POA: Insufficient documentation

## 2024-05-09 DIAGNOSIS — R002 Palpitations: Secondary | ICD-10-CM | POA: Diagnosis not present

## 2024-05-09 DIAGNOSIS — R42 Dizziness and giddiness: Secondary | ICD-10-CM | POA: Diagnosis present

## 2024-05-09 DIAGNOSIS — I10 Essential (primary) hypertension: Secondary | ICD-10-CM | POA: Insufficient documentation

## 2024-05-09 DIAGNOSIS — E119 Type 2 diabetes mellitus without complications: Secondary | ICD-10-CM | POA: Diagnosis not present

## 2024-05-09 DIAGNOSIS — I159 Secondary hypertension, unspecified: Secondary | ICD-10-CM | POA: Insufficient documentation

## 2024-05-09 DIAGNOSIS — I4719 Other supraventricular tachycardia: Secondary | ICD-10-CM | POA: Diagnosis not present

## 2024-05-09 LAB — COMPREHENSIVE METABOLIC PANEL WITH GFR
ALT: 13 U/L (ref 0–44)
AST: 18 U/L (ref 15–41)
Albumin: 4.2 g/dL (ref 3.5–5.0)
Alkaline Phosphatase: 64 U/L (ref 38–126)
Anion gap: 11 (ref 5–15)
BUN: 21 mg/dL (ref 8–23)
CO2: 24 mmol/L (ref 22–32)
Calcium: 11.4 mg/dL — ABNORMAL HIGH (ref 8.9–10.3)
Chloride: 107 mmol/L (ref 98–111)
Creatinine, Ser: 0.88 mg/dL (ref 0.44–1.00)
GFR, Estimated: >60 mL/min (ref >60)
Glucose, Bld: 108 mg/dL — ABNORMAL HIGH (ref 70–99)
Potassium: 3.8 mmol/L (ref 3.5–5.1)
Sodium: 142 mmol/L (ref 135–145)
Total Bilirubin: 0.3 mg/dL (ref 0.0–1.2)
Total Protein: 6.7 g/dL (ref 6.5–8.1)

## 2024-05-09 LAB — CBC
HCT: 43.2 % (ref 36.0–46.0)
Hemoglobin: 14.6 g/dL (ref 12.0–15.0)
MCH: 29.7 pg (ref 26.0–34.0)
MCHC: 33.8 g/dL (ref 30.0–36.0)
MCV: 87.8 fL (ref 80.0–100.0)
Platelets: 217 10*3/uL (ref 150–400)
RBC: 4.92 MIL/uL (ref 3.87–5.11)
RDW: 13.2 % (ref 11.5–15.5)
WBC: 7.5 10*3/uL (ref 4.0–10.5)
nRBC: 0 % (ref 0.0–0.2)

## 2024-05-09 MED ORDER — METOPROLOL TARTRATE 25 MG PO TABS
50.0000 mg | ORAL_TABLET | Freq: Once | ORAL | Status: AC
  Administered 2024-05-09: 50 mg via ORAL
  Filled 2024-05-09: qty 2

## 2024-05-09 NOTE — ED Triage Notes (Signed)
 Pt arrives with c/o HTN. Pt has hx of the same. Pt endorses dizziness and fatigue. Pt takes lopressor  and has not missed any doses. Pt denies CP or SOB.

## 2024-05-09 NOTE — ED Provider Notes (Addendum)
 [place] [facility] AT [facility] Provider Note   CSN: [id number] Arrival date & time: 05/09/24  1925     History  Chief Complaint  Patient presents with   Hypertension    Virginia [name] is a 83 y.o. female.  With a history of hypertension and diabetes who presents the ED for hypertension.  Patient reports some fatigue and lightheadedness.  She measured her blood pressure today and it was elevated at home.  Persistently elevated with blood pressures in 200 systolic.  No chest pain headaches change in vision shortness of breath.  Reports compliance with metoprolol  50 mg twice daily.  Was recently seen by cardiology given concern for new onset A-fib and discharged with Holter monitor   Hypertension       Home Medications Prior to Admission medications   Medication Sig Start Date End Date Taking? Authorizing Provider  Cholecalciferol (D3 HIGH POTENCY) 50 MCG (2000 UT) CAPS Take by mouth daily.    [provider]  ezetimibe  (ZETIA ) 10 MG tablet TAKE 1 TABLET BY MOUTH EVERY DAY 08/10/23   Colene Dauphin, MD  fenofibrate  (TRICOR ) 145 MG tablet TAKE 1 TABLET BY MOUTH EVERY DAY 01/08/24   Colene Dauphin, MD  ketoconazole (NIZORAL) 2 % cream Apply topically daily. 03/07/24   [provider]  Loratadine  (KS ALLERCLEAR PO) Take 1 tablet by mouth daily.    [provider]  metoprolol  tartrate (LOPRESSOR ) 50 MG tablet Take 1 tablet (50 mg total) by mouth 2 (two) times daily. 05/04/24 08/02/24  Patwardhan, Kaye Parsons, MD  pantoprazole  (PROTONIX ) 40 MG tablet Take 1 tablet (40 mg total) by mouth daily before breakfast. Take 30 to 60 minutes before a meal 11/26/23   Santina Cull R, PA-C  Semaglutide , 1 MG/DOSE, (OZEMPIC , 1 MG/DOSE,) 4 MG/3ML SOPN INJECT 1 MG ONCE A WEEK AS DIRECTED 05/09/24   Colene Dauphin, MD  triamcinolone  ointment (KENALOG) 0.1 % 1 Application. 03/07/24   [provider]      Allergies    Penicillins, Statins, and Bactrim   [sulfamethoxazole -trimethoprim ]    Review of Systems   Review of Systems  Physical Exam Updated Vital Signs BP (!) 167/80   Pulse 62   Temp 98.1 F (36.7 C) (Oral)   Resp 15   Wt 74.4 kg   SpO2 95%   BMI 29.05 kg/m  Physical Exam Vitals and nursing note reviewed.  HENT:     Head: Normocephalic and atraumatic.  Eyes:     Pupils: Pupils are equal, round, and reactive to light.  Cardiovascular:     Rate and Rhythm: Normal rate and regular rhythm.  Pulmonary:     Effort: Pulmonary effort is normal.     Breath sounds: Normal breath sounds.  Abdominal:     Palpations: Abdomen is soft.     Tenderness: There is no abdominal tenderness.  Skin:    General: Skin is warm and dry.  Neurological:     Mental Status: She is alert.  Psychiatric:        Mood and Affect: Mood normal.     ED Results / Procedures / Treatments   Labs (all labs ordered are listed, but only abnormal results are displayed) Labs Reviewed  COMPREHENSIVE METABOLIC PANEL WITH GFR - Abnormal; Notable for the following components:      Result Value   Glucose, Bld 108 (*)    Calcium  11.4 (*)    All other components within normal limits  CBC  CBG  MONITORING, ED    EKG EKG Interpretation Date/Time:  Monday May 09 2024 19:50:38 EDT Ventricular Rate:  70 PR Interval:  192 QRS Duration:  92 QT Interval:  347 QTC Calculation: 375 R Axis:   -15  Text Interpretation: Sinus rhythm Borderline left axis deviation Borderline T abnormalities, anterior leads Confirmed by Rafael Bun [phone]) on 05/09/2024 8:26:14 PM  Radiology No results found.  Procedures Procedures    Medications Ordered in ED Medications  metoprolol  tartrate (LOPRESSOR ) tablet 50 mg (50 mg Oral Given 05/09/24 2107)    ED Course/ Medical Decision Making/ A&P Clinical Course as of 05/09/24 2240  Mon May 09, 2024  2232 Blood pressure down to 160s systolic, representing about a 20% decrease from earlier today.  Will not be overly  aggressive in getting her blood pressure down any further tonight.  Patient asymptomatic at this time.  No evidence of endorgan damage.  No dysrhythmia on EKG.  We will show her how to wear the Holter monitor and she will follow-up with her PCP to discuss further management of her blood pressure [MP]    Clinical Course User Index [MP] Sallyanne Creamer, DO                                 Medical Decision Making 83 year old female with history as above presenting for hypertension.  Systolic blood pressures above 200s.  Some lightheadedness and fatigue no other symptoms reported.  Denies chest pain shortness of breath headache blurred vision.  Will look for evidence of endorgan damage with laboratory workup and EKG.  Will continue to monitor hemodynamic status.  Blood pressure on repeated measurements in the 170s here.  Will give her her home dose of metoprolol  and continue to monitor.  Suspect if there is no evidence of endorgan damage and blood pressure remains high but lower than what it was earlier today she would be appropriate for PCP follow-up  Amount and/or Complexity of Data Reviewed Labs: ordered.  Risk Prescription drug management.           Final Clinical Impression(s) / ED Diagnoses Final diagnoses:  Secondary hypertension    Rx / DC Orders ED Discharge Orders     None         Sallyanne Creamer, DO 05/09/24 2239    Sallyanne Creamer, DO 05/09/24 2240

## 2024-05-09 NOTE — ED Notes (Signed)
 ED Provider at bedside.

## 2024-05-09 NOTE — Discharge Instructions (Signed)
 You were seen in the emergency room for high blood pressure Your EKG and blood work looked okay here There was no evidence of damage to the organs because of the high blood pressure It is important that you follow-up with your primary care doctor to discuss blood pressure management Also follow-up with your cardiologist regarding the results of your Holter monitor test Return to the emerged part for severe headaches, chest pain trouble breathing or any other concerns

## 2024-05-11 ENCOUNTER — Ambulatory Visit: Admitting: Internal Medicine

## 2024-05-11 ENCOUNTER — Encounter: Payer: Self-pay | Admitting: Internal Medicine

## 2024-05-11 VITALS — BP 150/90 | HR 76 | Temp 97.9°F | Ht 63.0 in | Wt 164.0 lb

## 2024-05-11 DIAGNOSIS — I10 Essential (primary) hypertension: Secondary | ICD-10-CM

## 2024-05-11 MED ORDER — TELMISARTAN 20 MG PO TABS: 20.0000 mg | ORAL_TABLET | Freq: Every day | ORAL | 5 refills | Status: DC

## 2024-05-11 NOTE — Assessment & Plan Note (Addendum)
 Chronic Not controlled Continue metoprolol  50 mg bid Start telmisartan 20 mg daily Monitor BP at home - she will update me in 2 weeks BMP - in 2-3 weeks Sees cardiology next month

## 2024-05-11 NOTE — Patient Instructions (Addendum)
    Have blood work done in 2-3 weeks to recheck your potassium and kidney function with the new medication.    Medications changes include :   telmisartan 20 mg daily       Return for follow up as scheduled.

## 2024-05-11 NOTE — Progress Notes (Signed)
 Subjective:    Patient ID: Virginia [name], female    DOB: [birth year], 83 y.o.   MRN: [id number]     HPI Virginia [name] is here for follow up from the ED  ED 6/9 for htn.  She stated fatigue and lightheadedness.  BP at home was elevated.  SBP in 200s.  No chest pain, headaches, shortness of breath or change in vision.  In ED BP improved to 167/80.  EKG okay.  BP at home - 148/78-178/81 since being in the emergency room.  She does have a monitor on her for her blood pressure.  She sees cardiology next month.  Medications and allergies reviewed with patient and updated if appropriate.  Current Outpatient Medications on File Prior to Visit  Medication Sig Dispense Refill   Cholecalciferol (D3 HIGH POTENCY) 50 MCG (2000 UT) CAPS Take by mouth daily.     ezetimibe  (ZETIA ) 10 MG tablet TAKE 1 TABLET BY MOUTH EVERY DAY 90 tablet 3   fenofibrate  (TRICOR ) 145 MG tablet TAKE 1 TABLET BY MOUTH EVERY DAY 90 tablet 1   ketoconazole (NIZORAL) 2 % cream Apply topically daily.     Loratadine  (KS ALLERCLEAR PO) Take 1 tablet by mouth daily.     metoprolol  tartrate (LOPRESSOR ) 50 MG tablet Take 1 tablet (50 mg total) by mouth 2 (two) times daily. 180 tablet 3   pantoprazole  (PROTONIX ) 40 MG tablet Take 1 tablet (40 mg total) by mouth daily before breakfast. Take 30 to 60 minutes before a meal 90 tablet 1   Semaglutide , 1 MG/DOSE, (OZEMPIC , 1 MG/DOSE,) 4 MG/3ML SOPN INJECT 1 MG ONCE A WEEK AS DIRECTED 3 mL 0   triamcinolone  ointment (KENALOG) 0.1 % 1 Application.     No current facility-administered medications on file prior to visit.     Review of Systems  Respiratory:  Negative for cough, shortness of breath and wheezing.   Cardiovascular:  Negative for chest pain, palpitations and leg swelling.  Neurological:  Positive for dizziness and headaches. Negative for light-headedness.       Objective:   Vitals:   05/11/24 1348 05/11/24 1351  BP: (!) 148/78 (!) 150/90  Pulse:    Temp:     SpO2:     BP Readings from Last 3 Encounters:  05/11/24 (!) 150/90  05/09/24 (!) 167/80  05/04/24 (!) 150/74   Wt Readings from Last 3 Encounters:  05/11/24 164 lb (74.4 kg)  05/09/24 164 lb (74.4 kg)  05/04/24 164 lb 6.4 oz (74.6 kg)   Body mass index is 29.05 kg/m.    Physical Exam Constitutional:      General: She is not in acute distress.    Appearance: Normal appearance.  HENT:     Head: Normocephalic and atraumatic.  Eyes:     Conjunctiva/sclera: Conjunctivae normal.  Cardiovascular:     Rate and Rhythm: Normal rate and regular rhythm.     Heart sounds: Normal heart sounds.  Pulmonary:     Effort: Pulmonary effort is normal. No respiratory distress.     Breath sounds: Normal breath sounds. No wheezing.  Musculoskeletal:     Cervical back: Neck supple.     Right lower leg: No edema.     Left lower leg: No edema.  Lymphadenopathy:     Cervical: No cervical adenopathy.  Skin:    General: Skin is warm and dry.     Findings: No rash.  Neurological:     Mental Status: She is alert. Mental  status is at baseline.  Psychiatric:        Mood and Affect: Mood normal.        Behavior: Behavior normal.        Lab Results  Component Value Date   WBC 7.5 05/09/2024   HGB 14.6 05/09/2024   HCT 43.2 05/09/2024   PLT 217 05/09/2024   GLUCOSE 108 (H) 05/09/2024   CHOL 174 02/23/2024   TRIG 206.0 (H) 02/23/2024   HDL 42.90 02/23/2024   LDLDIRECT 134.0 02/18/2023   LDLCALC 90 02/23/2024   ALT 13 05/09/2024   AST 18 05/09/2024   NA 142 05/09/2024   K 3.8 05/09/2024   CL 107 05/09/2024   CREATININE 0.88 05/09/2024   BUN 21 05/09/2024   CO2 24 05/09/2024   TSH 2.06 02/18/2023   INR 1.0 05/25/2019   HGBA1C 5.8 02/23/2024   MICROALBUR 1.0 02/23/2024     Assessment & Plan:    See Problem List for Assessment and Plan of chronic medical problems.

## 2024-05-12 ENCOUNTER — Telehealth: Payer: Self-pay | Admitting: Cardiology

## 2024-05-12 DIAGNOSIS — I4719 Other supraventricular tachycardia: Secondary | ICD-10-CM

## 2024-05-12 NOTE — Telephone Encounter (Signed)
 Patient identification verified by 2 forms. Sims Duck, RN     Called and spoke to patient  Relayed provider recommendations  Patient aware:  - provider would like lexiscan ordered instead of GXT.  - Order has been placed and scheduling team will call her to get a date and time.    Patient verbalized understanding, no questions at this time

## 2024-05-12 NOTE — Telephone Encounter (Signed)
 Pt is requesting a callback regarding  CUP EXERCISE TOLERANCE TEST [1742]  Please advise.

## 2024-05-12 NOTE — Telephone Encounter (Signed)
 Spoke with pt, she is due to get a GXT and she feels she is not able to do that testing due to trouble with her knees. She report pain in her kness with extended walking and she wants to know if there is other testing she can do. Questions regarding monitor answered. Patient encouraged to call the company regarding battery issues with the monitor. Aware Dr Filiberto Hug is not in the office today but will forward for him to review.

## 2024-05-12 NOTE — Telephone Encounter (Signed)
 I had recommended exercise treadmill stress test to look for any exercise-induced arrhythmias.  I understand patient is not able to walk on the treadmill.  We can hold off stress testing in that case, unless she has any significant chest pain or shortness of breath.  In that case, we can order Lexiscan nuclear stress test.  Thanks MJP

## 2024-05-16 ENCOUNTER — Encounter (HOSPITAL_COMMUNITY): Payer: Self-pay | Admitting: *Deleted

## 2024-05-16 ENCOUNTER — Encounter (HOSPITAL_COMMUNITY): Payer: Self-pay | Admitting: Cardiology

## 2024-05-16 ENCOUNTER — Telehealth (HOSPITAL_COMMUNITY): Payer: Self-pay | Admitting: *Deleted

## 2024-05-16 NOTE — Telephone Encounter (Signed)
 Letter sent with instructions for upcoming stress test via [employer].  Virginia [name]

## 2024-05-23 ENCOUNTER — Other Ambulatory Visit: Payer: Self-pay | Admitting: Cardiology

## 2024-05-23 DIAGNOSIS — I4719 Other supraventricular tachycardia: Secondary | ICD-10-CM

## 2024-05-25 ENCOUNTER — Ambulatory Visit (HOSPITAL_COMMUNITY)
Admission: RE | Admit: 2024-05-25 | Discharge: 2024-05-25 | Disposition: A | Discharge status: Home / Self Care | Source: Ambulatory Visit | Attending: Cardiology | Admitting: Cardiology

## 2024-05-25 DIAGNOSIS — R0609 Other forms of dyspnea: Secondary | ICD-10-CM | POA: Diagnosis not present

## 2024-05-25 DIAGNOSIS — I4719 Other supraventricular tachycardia: Secondary | ICD-10-CM | POA: Insufficient documentation

## 2024-05-25 MED ORDER — REGADENOSON 0.4 MG/5ML IV SOLN
0.4000 mg | Freq: Once | INTRAVENOUS | Status: AC
  Administered 2024-05-25: 0.4 mg via INTRAVENOUS

## 2024-05-25 MED ORDER — TECHNETIUM TC 99M TETROFOSMIN IV KIT
11.0000 | PACK | Freq: Once | INTRAVENOUS | Status: AC | PRN
  Administered 2024-05-25: 11 via INTRAVENOUS

## 2024-05-25 MED ORDER — REGADENOSON 0.4 MG/5ML IV SOLN
INTRAVENOUS | Status: AC
Start: 2024-05-25 — End: 2024-05-25
  Filled 2024-05-25: qty 5

## 2024-05-25 MED ORDER — TECHNETIUM TC 99M TETROFOSMIN IV KIT
31.6000 | PACK | Freq: Once | INTRAVENOUS | Status: AC | PRN
  Administered 2024-05-25: 31.6 via INTRAVENOUS

## 2024-05-26 ENCOUNTER — Ambulatory Visit: Payer: Self-pay | Admitting: Cardiology

## 2024-05-26 DIAGNOSIS — M9903 Segmental and somatic dysfunction of lumbar region: Secondary | ICD-10-CM | POA: Diagnosis not present

## 2024-05-26 DIAGNOSIS — M5441 Lumbago with sciatica, right side: Secondary | ICD-10-CM | POA: Diagnosis not present

## 2024-05-26 LAB — MYOCARDIAL PERFUSION IMAGING
LV dias vol: 72 mL (ref 46–106)
LV sys vol: 18 mL (ref 3.8–5.2)
Nuc Stress EF: 75 %
Peak HR: 88 {beats}/min
Rest HR: 64 {beats}/min
Rest Nuclear Isotope Dose: 11 mCi
SDS: 1
SRS: 2
SSS: 1
ST Depression (mm): 0 mm
Stress Nuclear Isotope Dose: 31.6 mCi
TID: 0.98

## 2024-05-28 ENCOUNTER — Other Ambulatory Visit: Payer: Self-pay | Admitting: Physician Assistant

## 2024-05-30 ENCOUNTER — Telehealth: Payer: Self-pay | Admitting: Cardiology

## 2024-05-30 DIAGNOSIS — I471 Supraventricular tachycardia, unspecified: Secondary | ICD-10-CM

## 2024-05-30 NOTE — Telephone Encounter (Signed)
 Virginia [name] calling with abn EKG report

## 2024-05-30 NOTE — Telephone Encounter (Signed)
 Called and spoke with patient. Patient has been scheduled for a routine follow up with Alan Coombs, PA on Monday, 08/22/24 at 10:40 am. Patient is aware that I will mail appt information to her. Patient verbalized understanding and had no concerns at the end of the call.

## 2024-05-30 NOTE — Telephone Encounter (Signed)
 6/29 20:09 Aflutter at rate of 119 per minute. Waiting on monitor strips.

## 2024-05-31 NOTE — Telephone Encounter (Signed)
 Agree with the referral.  Thanks MJP

## 2024-05-31 NOTE — Telephone Encounter (Signed)
 Received report for this alert.  Reviewed with DOD, Dr. Ganji, who requested to see more of the before and after alert rhythm.  More information obtained and reviewed with him.     Per Dr. Ladona, the patient is having SVT episodes and needs an EP referral and she can stop wearing the monitor.   Called patient and left message for her to call back.    EP referral placed.

## 2024-05-31 NOTE — Telephone Encounter (Signed)
 Strips given to L. Smitty to be scanned into chart.

## 2024-05-31 NOTE — Telephone Encounter (Deleted)
 Left

## 2024-06-02 ENCOUNTER — Ambulatory Visit (HOSPITAL_COMMUNITY)
Admission: RE | Admit: 2024-06-02 | Discharge: 2024-06-02 | Disposition: A | Discharge status: Home / Self Care | Source: Ambulatory Visit | Attending: Cardiology | Admitting: Cardiology

## 2024-06-02 ENCOUNTER — Ambulatory Visit: Payer: Self-pay | Admitting: Cardiology

## 2024-06-02 ENCOUNTER — Encounter (HOSPITAL_COMMUNITY)

## 2024-06-02 DIAGNOSIS — I4719 Other supraventricular tachycardia: Secondary | ICD-10-CM | POA: Diagnosis not present

## 2024-06-02 DIAGNOSIS — R0609 Other forms of dyspnea: Secondary | ICD-10-CM

## 2024-06-02 LAB — ECHOCARDIOGRAM COMPLETE
Area-P 1/2: 3.77 cm2
S' Lateral: 2.9 cm

## 2024-06-02 NOTE — Telephone Encounter (Signed)
 Pt advised of MD advisement, she is scheduled for a consult w/ EP 7/30. Advised to call the office if she has any HR issues/symptoms.  Patient verbalized understanding and agreeable to plan.

## 2024-06-02 NOTE — Telephone Encounter (Signed)
 Pt returning call

## 2024-06-02 NOTE — Telephone Encounter (Signed)
 Returned call to patient.   Adv of monitor findings and recommendations for EP.  She is aware of appointment on 06/29/24.   Instructed on vagal maneuvers.  Will cancel f/u with Dr. Elmira on 06/22/24 since patient seeing EP for monitor findings on 06/29/24.

## 2024-06-03 ENCOUNTER — Other Ambulatory Visit: Payer: Self-pay | Admitting: Internal Medicine

## 2024-06-06 NOTE — Progress Notes (Unsigned)
 New Patient Note  RE: Virginia [name] MRN: [id number] DOB: 07/04/1941 Date of Office Visit: 06/07/2024  Consult requested by: Geofm Glade PARAS, MD Primary care provider: Geofm Glade PARAS, MD  Chief Complaint: No chief complaint on file.  History of Present Illness: I had the pleasure of seeing Virginia [name] for initial evaluation at the [facility] on 06/07/2024. She is a 83 y.o. female, who is referred here by Geofm Glade PARAS, MD for the evaluation of ***.  Discussed the use of AI scribe software for clinical note transcription with the patient, who gave verbal consent to proceed.  History of Present Illness             ***  Assessment and Plan: Tabor is a 83 y.o. female with: ***  Assessment and Plan               No follow-ups on file.  No orders of the defined types were placed in this encounter.  Lab Orders  No laboratory test(s) ordered today    Other allergy screening: Asthma: {Blank single:19197::yes,no} Rhino conjunctivitis: {Blank single:19197::yes,no} Food allergy: {Blank single:19197::yes,no} Medication allergy: {Blank single:19197::yes,no} Hymenoptera allergy: {Blank single:19197::yes,no} Urticaria: {Blank single:19197::yes,no} Eczema:{Blank single:19197::yes,no} History of recurrent infections suggestive of immunodeficency: {Blank single:19197::yes,no}  Diagnostics: Spirometry:  Tracings reviewed. Her effort: {Blank single:19197::Good reproducible efforts.,It was hard to get consistent efforts and there is a question as to whether this reflects a maximal maneuver.,Poor effort, data can not be interpreted.} FVC: ***L FEV1: ***L, ***% predicted FEV1/FVC ratio: ***% Interpretation: {Blank single:19197::Spirometry consistent with mild obstructive disease,Spirometry consistent with moderate obstructive disease,Spirometry consistent with severe obstructive disease,Spirometry  consistent with possible restrictive disease,Spirometry consistent with mixed obstructive and restrictive disease,Spirometry uninterpretable due to technique,Spirometry consistent with normal pattern,No overt abnormalities noted given today's efforts}.  Please see scanned spirometry results for details.  Skin Testing: {Blank single:19197::Select foods,Environmental allergy panel,Environmental allergy panel and select foods,Food allergy panel,None,Deferred due to recent antihistamines use}. *** Results discussed with patient/family.   Past Medical History: Patient Active Problem List   Diagnosis Date Noted  . Atrial tachycardia (HCC) 05/04/2024  . Hypercalcemia 04/17/2024  . Dizziness 02/23/2024  . Frequent urination 01/15/2023  . Pain and swelling of left knee 09/27/2022  . Pain and swelling of left lower leg 09/27/2022  . Statin myopathy 08/20/2022  . Apnea 02/17/2022  . Snoring 02/17/2022  . Loose stools 02/13/2021  . Racing heart beat 02/13/2021  . Scoliosis w/ chronic back pain, chiropractor 08/01/2020  . Hypertensive retinopathy 07/25/2020  . Headache 02/02/2020  . DOE (dyspnea on exertion) 05/24/2019  . Tinnitus 05/24/2019  . Pain of toe of right foot 05/24/2019  . Left foot pain 05/13/2017  . Diabetes mellitus without complication (HCC) 04/19/2016  . Constipation 04/16/2016  . Bilateral thumb pain 04/16/2016  . Dysphagia 10/17/2015  . Osteopenia 06/21/2014  . Sciatica of right side 05/12/2011  . 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 Osteoarthritis 01/01/2010  . FIBROIDS, UTERUS 12/13/2009  . Dyslipidemia 12/13/2009  . Essential hypertension 12/13/2009  . Allergic rhinitis 12/13/2009  . GERD (gastroesophageal reflux disease) 12/13/2009  . History of colonic polyps 12/13/2009   Past Medical History:  Diagnosis Date  . ALLERGIC RHINITIS   . Allergy   . Anemia   . Angiectasia 06/30/2019   small- in the small bowel  . Anxiety   . Blood transfusion  without reported diagnosis   . Cataract    beginning stage in lt. eye  . Chronic kidney disease   .  Diabetes (HCC) 08/2022  . Diverticulosis   . FIBROIDS, UTERUS    s/p fibroidectomy  . GERD   . HYPERLIPIDEMIA    intol of statins (myalgia, thumb pain)  . HYPERTENSION   . Intestinal ulcer 06/30/2019   2 large ulcerations in the mid to distal small bowel (seen on capsule endoscopy)  . OSTEOARTHRITIS   . Osteopenia 06/21/2014   DEXA@LB  06/14/14: -1.6  . 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 Scoliosis   . Tubular adenoma of colon    Past Surgical History: Past Surgical History:  Procedure Laterality Date  . child birth  34 & 65  . COLONOSCOPY    . ESOPHAGEAL DILATION N/A   . Fibroidectomy    . Low back surgery  01/1994   2 bulging disc  . POLYPECTOMY    . TONSILLECTOMY  1968  . UPPER GASTROINTESTINAL ENDOSCOPY     Medication List:  Current Outpatient Medications  Medication Sig Dispense Refill  . Cholecalciferol (D3 HIGH POTENCY) 50 MCG (2000 UT) CAPS Take by mouth daily.    . ezetimibe  (ZETIA ) 10 MG tablet TAKE 1 TABLET BY MOUTH EVERY DAY 90 tablet 3  . fenofibrate  (TRICOR ) 145 MG tablet TAKE 1 TABLET BY MOUTH EVERY DAY 90 tablet 1  . ketoconazole (NIZORAL) 2 % cream Apply topically daily.    . Loratadine  (KS ALLERCLEAR PO) Take 1 tablet by mouth daily.    . metoprolol  tartrate (LOPRESSOR ) 50 MG tablet Take 1 tablet (50 mg total) by mouth 2 (two) times daily. 180 tablet 3  . pantoprazole  (PROTONIX ) 40 MG tablet TAKE 1 TABLET (40 MG TOTAL) BY MOUTH DAILY BEFORE BREAKFAST. TAKE 30 TO 60 MINUTES BEFORE A MEAL 90 tablet 1  . Semaglutide , 1 MG/DOSE, (OZEMPIC , 1 MG/DOSE,) 4 MG/3ML SOPN INJECT 1 MG ONCE A WEEK AS DIRECTED 3 mL 0  . telmisartan  (MICARDIS ) 20 MG tablet Take 1 tablet (20 mg total) by mouth daily. 30 tablet 5  . triamcinolone  ointment (KENALOG) 0.1 % 1 Application.     No current facility-administered medications for this visit.   Allergies: Allergies   Allergen Reactions  . Penicillins Hives    Did it involve swelling of the face/tongue/throat, SOB, or low BP? No Did it involve sudden or severe rash/hives, skin peeling, or any reaction on the inside of your mouth or nose? No Did you need to seek medical attention at a hospital or doctor's office? No When did it last happen? many years ago      If all above answers are "NO", may proceed with cephalosporin use.   . Statins Other (See Comments)    pain in base of thumbs  . Bactrim  [Sulfamethoxazole -Trimethoprim ] Other (See Comments)    Cause stomach issues   Social History: Social History   Socioeconomic History  . Marital status: Widowed    Spouse name: Not on file  . Number of children: 2  . Years of education: Not on file  . Highest education level: Not on file  Occupational History  . Occupation: retired  Tobacco Use  . Smoking status: Never  . Smokeless tobacco: Never  . Tobacco comments:    Married, lives with spouse. Retired Air traffic controller  . Vaping status: Never Used  Substance and Sexual Activity  . Alcohol use: No    Alcohol/week: 0.0 standard drinks of alcohol  . Drug use: No  . Sexual activity: Not Currently  Other Topics Concern  . Not on  file  Social History Narrative    2 children;   One in [place] and one is in [place]   Retired Runner, broadcasting/film/video   Social Drivers of [employer]: Low Risk  (08/20/2022)   Overall Financial Resource Strain (CARDIA)   . Difficulty of Paying Living Expenses: Not hard at all  Food Insecurity: No Food Insecurity (08/20/2022)   Hunger Vital Sign   . Worried About Programme researcher, broadcasting/film/video in the Last Year: Never true   . Ran Out of Food in the Last Year: Never true  Transportation Needs: No Transportation Needs (08/20/2022)   PRAPARE - Transportation   . Lack of Transportation (Medical): No   . Lack of Transportation (Non-Medical): No  Physical Activity: Sufficiently Active (08/20/2022)   Exercise  Vital Sign   . Days of Exercise per Week: 5 days   . Minutes of Exercise per Session: 30 min  Recent Concern: Physical Activity - Inactive (08/20/2022)   Exercise Vital Sign   . Days of Exercise per Week: 0 days   . Minutes of Exercise per Session: 0 min  Stress: No Stress Concern Present (08/20/2022)   [employer] of Occupational Health - Occupational Stress Questionnaire   . Feeling of Stress : Not at all  Social Connections: Socially Integrated (08/20/2022)   Social Connection and Isolation Panel   . Frequency of Communication with Friends and Family: More than three times a week   . Frequency of Social Gatherings with Friends and Family: More than three times a week   . Attends Religious Services: More than 4 times per year   . Active Member of Clubs or Organizations: Yes   . Attends Banker Meetings: 1 to 4 times per year   . Marital Status: Married   Lives in a ***. Smoking: *** Occupation: ***  Environmental HistorySurveyor, minerals in the house: Network engineer in the family room: {Blank single:19197::yes,no} Carpet in the bedroom: {Blank single:19197::yes,no} Heating: {Blank single:19197::electric,gas,heat pump} Cooling: {Blank single:19197::central,window,heat pump} Pet: {Blank single:19197::yes ***,no}  Family History: Family History  Problem Relation Age of Onset  . Arthritis Mother   . Coronary artery disease Father   . Hyperlipidemia Brother   . Diabetes Maternal Aunt   . Diabetes Paternal Aunt   . Hyperlipidemia Cousin   . Heart disease Cousin   . Colon cancer Neg Hx   . Colon polyps Neg Hx   . Esophageal cancer Neg Hx   . Stomach cancer Neg Hx   . Rectal cancer Neg Hx    Problem                               Relation Asthma                                   *** Eczema                                *** Food allergy                          *** Allergic rhino conjunctivitis      ***  Review of Systems  Constitutional:  Negative for appetite change, chills, fever and unexpected weight change.  HENT:  Negative  for congestion and rhinorrhea.   Eyes:  Negative for itching.  Respiratory:  Negative for cough, chest tightness, shortness of breath and wheezing.   Cardiovascular:  Negative for chest pain.  Gastrointestinal:  Negative for abdominal pain.  Genitourinary:  Negative for difficulty urinating.  Skin:  Negative for rash.  Neurological:  Negative for headaches.    Objective: There were no vitals taken for this visit. There is no height or weight on file to calculate BMI. Physical Exam Vitals and nursing note reviewed.  Constitutional:      Appearance: Normal appearance. She is well-developed.  HENT:     Head: Normocephalic and atraumatic.     Right Ear: Tympanic membrane and external ear normal.     Left Ear: Tympanic membrane and external ear normal.     Nose: Nose normal.     Mouth/Throat:     Mouth: Mucous membranes are moist.     Pharynx: Oropharynx is clear.  Eyes:     Conjunctiva/sclera: Conjunctivae normal.  Cardiovascular:     Rate and Rhythm: Normal rate and regular rhythm.     Heart sounds: Normal heart sounds. No murmur heard.    No friction rub. No gallop.  Pulmonary:     Effort: Pulmonary effort is normal.     Breath sounds: Normal breath sounds. No wheezing, rhonchi or rales.  Musculoskeletal:     Cervical back: Neck supple.  Skin:    General: Skin is warm.     Findings: No rash.  Neurological:     Mental Status: She is alert and oriented to person, place, and time.  Psychiatric:        Behavior: Behavior normal.   The plan was reviewed with the patient/family, and all questions/concerned were addressed.  It was my pleasure to see Aerielle today and participate in her care. Please feel free to contact me with any questions or concerns.  Sincerely,  Orlan Cramp, DO Allergy & Immunology  [facility] office: [phone] [facility] office: [phone]

## 2024-06-07 ENCOUNTER — Ambulatory Visit: Admitting: Allergy

## 2024-06-07 ENCOUNTER — Other Ambulatory Visit (INDEPENDENT_AMBULATORY_CARE_PROVIDER_SITE_OTHER)

## 2024-06-07 ENCOUNTER — Encounter: Payer: Self-pay | Admitting: Allergy

## 2024-06-07 VITALS — BP 110/64 | HR 80 | Temp 97.9°F | Resp 14 | Ht 62.21 in | Wt 164.8 lb

## 2024-06-07 DIAGNOSIS — H1013 Acute atopic conjunctivitis, bilateral: Secondary | ICD-10-CM | POA: Diagnosis not present

## 2024-06-07 DIAGNOSIS — I10 Essential (primary) hypertension: Secondary | ICD-10-CM

## 2024-06-07 DIAGNOSIS — J3089 Other allergic rhinitis: Secondary | ICD-10-CM

## 2024-06-07 DIAGNOSIS — H04123 Dry eye syndrome of bilateral lacrimal glands: Secondary | ICD-10-CM | POA: Diagnosis not present

## 2024-06-07 LAB — BASIC METABOLIC PANEL WITH GFR
BUN: 21 mg/dL (ref 6–23)
CO2: 29 meq/L (ref 19–32)
Calcium: 11 mg/dL — ABNORMAL HIGH (ref 8.4–10.5)
Chloride: 104 meq/L (ref 96–112)
Creatinine, Ser: 0.88 mg/dL (ref 0.40–1.20)
GFR: 61.63 mL/min (ref >60.00)
Glucose, Bld: 100 mg/dL — ABNORMAL HIGH (ref 70–99)
Potassium: 4.1 meq/L (ref 3.5–5.1)
Sodium: 139 meq/L (ref 135–145)

## 2024-06-07 NOTE — Patient Instructions (Addendum)
 Recommend eye exam. Stop pataday eye drops. Use over the counter refresh rewetting eye drops.  Try NOT to take zyrtec daily as it can worsen dry eyes.   Follow up for skin testing if interested.     Return for allergy skin testing. Will make additional recommendations based on results. Make sure you don't take any antihistamines for 3 days before the skin testing appointment. No zyrtec for 3 days before.  Don't put any lotion on the back and arms on the day of testing.  Must be in good health and not ill. No vaccines/injections/antibiotics within the past 7 days.  Plan on being here for 30-60 minutes.

## 2024-06-09 ENCOUNTER — Ambulatory Visit: Attending: Cardiology

## 2024-06-09 ENCOUNTER — Ambulatory Visit: Payer: Self-pay | Admitting: Internal Medicine

## 2024-06-09 DIAGNOSIS — I4719 Other supraventricular tachycardia: Secondary | ICD-10-CM

## 2024-06-10 DIAGNOSIS — H04123 Dry eye syndrome of bilateral lacrimal glands: Secondary | ICD-10-CM | POA: Diagnosis not present

## 2024-06-19 DIAGNOSIS — I4719 Other supraventricular tachycardia: Secondary | ICD-10-CM | POA: Diagnosis not present

## 2024-06-22 ENCOUNTER — Ambulatory Visit: Admitting: Cardiology

## 2024-06-22 DIAGNOSIS — M5441 Lumbago with sciatica, right side: Secondary | ICD-10-CM | POA: Diagnosis not present

## 2024-06-22 DIAGNOSIS — M9903 Segmental and somatic dysfunction of lumbar region: Secondary | ICD-10-CM | POA: Diagnosis not present

## 2024-06-28 NOTE — Progress Notes (Unsigned)
 Electrophysiology Office Note:    Date:  06/29/2024   ID:  Virginia [name], DOB Jul 01, 1941, MRN [id number]  PCP:  Geofm Glade PARAS, MD   [facility] Providers Cardiologist:  Newman PARAS Lawrence, MD     Referring MD: Ladona Heinz, MD   History of Present Illness:    Virginia [name] is a 83 y.o. female with a medical history significant for hypertension, type 2 diabetes, sleep apnea, GI bleed, referred for arrhythmia management.      ne for evaluation of palpitations.   Discussed the use of AI scribe software for clinical note transcription with the patient, who gave verbal consent to proceed.  History of Present Illness Virginia [name] is an 83 year old female who presents with tachycardia. She was referred by Dr. Lawrence for evaluation of tachycardia.  She experienced palpitations and dizziness during an emergency room visit on May 13th, where she was started on Xarelto . The EKG showed a narrow complex tachycardia with a long RP interval. She has had episodes of fast heartbeat for several years, often associated with anxiety, hyperventilation, and fainting, typically triggered by stress. During a recent episode, her heart rate increased to 130 beats per minute while on a heart monitor. She has been on metoprolol  for a long time, but episodes persist. Telmisartan  was recently added for blood pressure management. Diltiazem  was initiated but discontinued due to dizziness.         Today, she reports that she feels well and has no acute complaints  EKGs/Labs/Other Studies Reviewed Today:     Echocardiogram:  TTE June 02, 2024 LVEF 65 to 70%.  Grade 1 diastolic dysfunction.  Degenerative mitral valve.  Tricuspid aortic valve   Monitors:  17 day monitor June 2025-- my interpretation Sinus rhythm Episodes of regular, narrow complex tachycardia are documented.  I suspect this is atrial flutter initiated by PACs   EKG:   EKG  Interpretation Date/Time:  Wednesday June 29 2024 10:58:40 EDT Ventricular Rate:  66 PR Interval:  198 QRS Duration:  78 QT Interval:  378 QTC Calculation: 396 R Axis:   8  Text Interpretation: Normal sinus rhythm Normal ECG When compared with ECG of 09-May-2024 19:50, No significant change since last tracing Confirmed by Nancey Scotts [id number]) on 06/29/2024 11:13:17 AM     Physical Exam:    VS:  BP (!) 150/80 (BP Location: Right Arm, Patient Position: Sitting, Cuff Size: Large)   Pulse 66   Ht 5' 2.21 (1.58 m)   Wt 161 lb (73 kg)   SpO2 98%   BMI 29.25 kg/m     Wt Readings from Last 3 Encounters:  06/29/24 161 lb (73 kg)  06/07/24 164 lb 12 oz (74.7 kg)  05/25/24 164 lb (74.4 kg)     GEN: Well nourished, well developed in no acute distress CARDIAC: RRR, no murmurs, rubs, gallops RESPIRATORY:  Normal work of breathing MUSCULOSKELETAL: no edema    ASSESSMENT & PLAN:     Atrial tachycardia ECGs from the May 13 hospital visit show a regular SVT most consistent with AT; I do not see AF on the available ECGs Monitor shows narrow complex tachycardia with the appearance of flutter, but in comparison to twelve-lead EKG this is much likely atrial tachycardia With paroxysmal atrial tachycardia, she is not a good candidate for EP study and ablation Will continue with conservative therapy initially, switch from metoprolol  to propranolol  and try magnesium taurate If she continues to have episodes, may consider antibiotic drugs  Hypertension Propranolol  would likely have increased BP effect over metoprolol  Start propranolol  LA 80   Signed, Eulas FORBES Furbish, MD  06/29/2024 11:16 AM    [facility]

## 2024-06-29 ENCOUNTER — Ambulatory Visit: Attending: Cardiology | Admitting: Cardiovascular Disease

## 2024-06-29 ENCOUNTER — Encounter: Payer: Self-pay | Admitting: Cardiovascular Disease

## 2024-06-29 VITALS — BP 150/80 | HR 66 | Ht 62.21 in | Wt 161.0 lb

## 2024-06-29 DIAGNOSIS — I4719 Other supraventricular tachycardia: Secondary | ICD-10-CM

## 2024-06-29 DIAGNOSIS — I471 Supraventricular tachycardia, unspecified: Secondary | ICD-10-CM | POA: Diagnosis not present

## 2024-06-29 MED ORDER — PROPRANOLOL HCL ER 80 MG PO CP24: 80.0000 mg | ORAL_CAPSULE | Freq: Every day | ORAL | 1 refills | Status: DC

## 2024-06-29 NOTE — Patient Instructions (Signed)
 Medication Instructions:  Your physician has recommended you make the following change in your medication:   ** Stop Metoprolol   ** Start Propranolol  80mg  - 1 capsule by mouth daily  ** Start Magnesium Taurate 400mg  daily - You can purchase this off of [employer].  *If you need a refill on your cardiac medications before your next appointment, please call your pharmacy*  Lab Work: None ordered. None ordered.  If you have labs (blood work) drawn today and your tests are completely normal, you will receive your results only by: MyChart Message (if you have MyChart) OR A paper copy in the mail If you have any lab test that is abnormal or we need to change your treatment, we will call you to review the results.  Testing/Procedures: None ordered.   Follow-Up: At [facility], you and your health needs are our priority.  As part of our continuing mission to provide you with exceptional heart care, our providers are all part of one team.  This team includes your primary Cardiologist (physician) and Advanced Practice Providers or APPs (Physician Assistants and Nurse Practitioners) who all work together to provide you with the care you need, when you need it.  Your next appointment:   6 months with Dr Mealor

## 2024-07-01 DIAGNOSIS — H04123 Dry eye syndrome of bilateral lacrimal glands: Secondary | ICD-10-CM

## 2024-07-01 HISTORY — DX: Dry eye syndrome of bilateral lacrimal glands: H04.123

## 2024-07-02 ENCOUNTER — Other Ambulatory Visit: Payer: Self-pay | Admitting: Internal Medicine

## 2024-07-07 ENCOUNTER — Other Ambulatory Visit: Payer: Self-pay | Admitting: Internal Medicine

## 2024-07-07 DIAGNOSIS — H04123 Dry eye syndrome of bilateral lacrimal glands: Secondary | ICD-10-CM | POA: Diagnosis not present

## 2024-07-13 DIAGNOSIS — H0015 Chalazion left lower eyelid: Secondary | ICD-10-CM | POA: Diagnosis not present

## 2024-07-13 DIAGNOSIS — M5441 Lumbago with sciatica, right side: Secondary | ICD-10-CM | POA: Diagnosis not present

## 2024-07-13 DIAGNOSIS — M9903 Segmental and somatic dysfunction of lumbar region: Secondary | ICD-10-CM | POA: Diagnosis not present

## 2024-07-13 DIAGNOSIS — H04123 Dry eye syndrome of bilateral lacrimal glands: Secondary | ICD-10-CM | POA: Diagnosis not present

## 2024-07-29 ENCOUNTER — Other Ambulatory Visit: Payer: Self-pay | Admitting: Internal Medicine

## 2024-08-01 ENCOUNTER — Other Ambulatory Visit: Payer: Self-pay | Admitting: Internal Medicine

## 2024-08-03 DIAGNOSIS — M5441 Lumbago with sciatica, right side: Secondary | ICD-10-CM | POA: Diagnosis not present

## 2024-08-03 DIAGNOSIS — M9903 Segmental and somatic dysfunction of lumbar region: Secondary | ICD-10-CM | POA: Diagnosis not present

## 2024-08-19 DIAGNOSIS — H25813 Combined forms of age-related cataract, bilateral: Secondary | ICD-10-CM | POA: Diagnosis not present

## 2024-08-19 DIAGNOSIS — H524 Presbyopia: Secondary | ICD-10-CM | POA: Diagnosis not present

## 2024-08-19 DIAGNOSIS — H04123 Dry eye syndrome of bilateral lacrimal glands: Secondary | ICD-10-CM | POA: Diagnosis not present

## 2024-08-19 LAB — OPHTHALMOLOGY REPORT-SCANNED

## 2024-08-19 NOTE — Progress Notes (Signed)
 08/19/2024 Virginia [name] [id number] 07/04/1941  Referring provider: Geofm Glade PARAS, MD Primary GI doctor: Dr. Albertus  ASSESSMENT AND PLAN:   Gastroesophageal reflux disease with history of hiatal hernia  EGD 06/2019 with HH/esophagitis/gastritis Symptoms improved on PPI daily, no dysphagia, rare GERD Lifestyle changes discussed, avoid NSAIDS, ETOH Discussed GLP1 with the patient, mechanism of action and how this can worsen and/or cause nausea/GERD by causing gastroparesis, discussed diet, tolerating well.  Refilled medications, given information Follow up 1 year  Acute viral gastroenteritis with post-infectious bowel changes and recent onset constipation Recent travel-associated viral gastroenteritis with persistent diarrhea, nausea, bloating, and new constipation. 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 Discussed dietary factors like yogurt and broccoli exacerbating symptoms. - Avoid milk products and potentially gluten. - Avoid gaseous foods such as broccoli and cauliflower. - Initiate probiotic regimen for 2-4 weeks, e.g., Align or Restoril. - Provide IBS diet guidance to identify and avoid trigger foods. - call if symptoms return or any new symptoms, consider KUB, stool studies  Personal history of colonic polyps 01/21/2022 colonoscopy Dr. Albertus for history of adenomatous polyps, good bowel prep, 3 mm TA polyp cecum, 4 mm TA polyp in ascending colon, 8 mm TA polyp descending, 3 mm hyperplastic polyp sigmoid, diverticulosis and small internal hemorrhoids.   No recall secondary to age.  History of Present Illness:  83 y.o. female  with a past medical history of anxiety, CKD, chol, osteopenia, history of GERD, tubular adenoma, diverticulosis and others listed below, returns to clinic today for evaluation of gerd.  05/14/2024 office visit with myself in regards to reflux, started on pantoprazole  daily, discussed GLP-1 and monitor for GERD/nausea and gastroparesis.   Discussed the use  of AI scribe software for clinical note transcription with the patient, who gave verbal consent to proceed.  History of Present Illness   Virginia [name] is an 83 year old female who presents with gastrointestinal symptoms following recent travel.  She developed gastrointestinal symptoms after returning from a trip to [place] . Initially, she experienced diarrhea lasting approximately two weeks, characterized by loose stools three to four times a day. There were no nighttime symptoms, blood, or dark stools. She also experienced bloating and nausea but no fever or chills.  Over the past two days, she has not had any bowel movements despite feeling the urge, accompanied by gut pain. She reports increased gas over the last two days, which is unusual for her.  She has been on Ozempic , currently at a dose of one milligram, which was increased from 0.5 milligrams in January.  She mentions consuming yogurt daily for about ten days, which she suspects might have affected her symptoms. She also ate broccoli for three consecutive days, which she thought might have contributed to her symptoms.  No nighttime symptoms, blood in the stool, dark stools, fever, chills, shortness of breath, chest pain, or leg swelling. She reports nausea and bloating but no trouble swallowing.       She  reports that she has never smoked. She has never used smokeless tobacco. She reports that she does not drink alcohol and does not use drugs. Her family history includes Arthritis in her mother; Asthma in her son; Coronary artery disease in her father; Diabetes in her maternal aunt and paternal aunt; Heart disease in her cousin; Hyperlipidemia in her brother and cousin.  05/2018 colonoscopy due to history of adenomatous polyps with villous component had 3 polyps largest 5 mm tubular adenoma no high-grade dysplasia.  Diverticula and small internal hemorrhoids. 06/24/2019  upper endo for IDA medium sized hiatal hernia, gastritis,  gastric polyps 06/30/2019 capsule endo cratered ulcers small bowel 09/24/2021 office visit with Greig Corti for mid/right lower quadrant abdominal discomfort proceeded with CT and colon if unrevealing.  But patient never proceeded with CT abdomen pelvis with contrast. 01/21/2022 colonoscopy Dr. Albertus for history of adenomatous polyps, good bowel prep, 3 mm TA polyp cecum, 4 mm TA polyp in ascending colon, 8 mm TA polyp descending, 3 mm hyperplastic polyp sigmoid, diverticulosis and small internal hemorrhoids.   No recall secondary to age.  Current Medications:   Current Outpatient Medications (Endocrine & Metabolic):    Semaglutide , 1 MG/DOSE, (OZEMPIC , 1 MG/DOSE,) 4 MG/3ML SOPN, INJECT 1 MG ONCE A WEEK AS DIRECTED  Current Outpatient Medications (Cardiovascular):    ezetimibe  (ZETIA ) 10 MG tablet, TAKE 1 TABLET BY MOUTH EVERY DAY   fenofibrate  (TRICOR ) 145 MG tablet, TAKE 1 TABLET BY MOUTH EVERY DAY   propranolol  ER (INDERAL  LA) 80 MG 24 hr capsule, Take 1 capsule (80 mg total) by mouth daily.   telmisartan  (MICARDIS ) 20 MG tablet, Take 1 tablet (20 mg total) by mouth daily.     Current Outpatient Medications (Other):    Cholecalciferol (D3 HIGH POTENCY) 50 MCG (2000 UT) CAPS, Take by mouth daily.   pantoprazole  (PROTONIX ) 40 MG tablet, TAKE 1 TABLET (40 MG TOTAL) BY MOUTH DAILY BEFORE BREAKFAST. TAKE 30 TO 60 MINUTES BEFORE A MEAL  Surgical History:  She  has a past surgical history that includes child birth (78 & 14); Low back surgery (01/1994); Tonsillectomy (1968); Fibroidectomy; Colonoscopy; Polypectomy; Upper gastrointestinal endoscopy; and Esophageal dilation (N/A).  Current Medications, Allergies, Past Medical History, Past Surgical History, Family History and Social History were reviewed in [employer] record.  Physical Exam: There were no vitals taken for this visit. General:   Pleasant, well developed female in no acute distress Heart : Regular  rate and rhythm; no murmurs Pulm: Clear anteriorly; no wheezing Abdomen:  Soft, Obese AB, Active bowel sounds. No tenderness . , No organomegaly appreciated. Rectal: Not evaluated Extremities:  without  edema. Neurologic:  Alert and  oriented x4;  No focal deficits.  Psych:  Cooperative. Normal mood and affect.   Alan JONELLE Coombs, PA-C 08/19/24

## 2024-08-22 ENCOUNTER — Encounter: Payer: Self-pay | Admitting: Physician Assistant

## 2024-08-22 ENCOUNTER — Ambulatory Visit: Admitting: Physician Assistant

## 2024-08-22 VITALS — BP 130/66 | HR 66 | Ht 63.0 in | Wt 161.4 lb

## 2024-08-22 DIAGNOSIS — K2100 Gastro-esophageal reflux disease with esophagitis, without bleeding: Secondary | ICD-10-CM

## 2024-08-22 DIAGNOSIS — Z860101 Personal history of adenomatous and serrated colon polyps: Secondary | ICD-10-CM

## 2024-08-22 DIAGNOSIS — A09 Infectious gastroenteritis and colitis, unspecified: Secondary | ICD-10-CM | POA: Diagnosis not present

## 2024-08-22 DIAGNOSIS — Z860100 Personal history of colon polyps, unspecified: Secondary | ICD-10-CM | POA: Diagnosis not present

## 2024-08-22 DIAGNOSIS — K219 Gastro-esophageal reflux disease without esophagitis: Secondary | ICD-10-CM | POA: Diagnosis not present

## 2024-08-22 NOTE — Patient Instructions (Addendum)
 _______________________________________________________  If your blood pressure at your visit was 140/90 or greater, please contact your primary care physician to follow up on this.  _______________________________________________________  If you are age 81 or older, your body mass index should be between 23-30. Your Body mass index is 28.59 kg/m. If this is out of the aforementioned range listed, please consider follow up with your Primary Care Provider.  If you are age 69 or younger, your body mass index should be between 19-25. Your Body mass index is 28.59 kg/m. If this is out of the aformentioned range listed, please consider follow up with your Primary Care Provider.   ________________________________________________________  The  GI providers would like to encourage you to use MYCHART to communicate with providers for non-urgent requests or questions.  Due to long hold times on the telephone, sending your provider a message by [facility] may be a faster and more efficient way to get a response.  Please allow 48 business hours for a response.  Please remember that this is for non-urgent requests.  _______________________________________________________  [facility] is using a team-based approach to care.  Your team is made up of your doctor and two to three APPS. Our APPS (Nurse Practitioners and Physician Assistants) work with your physician to ensure care continuity for you. They are fully qualified to address your health concerns and develop a treatment plan. They communicate directly with your gastroenterologist to care for you. Seeing the Advanced Practice Practitioners on your physician's team can help you by facilitating care more promptly, often allowing for earlier appointments, access to diagnostic testing, procedures, and other specialty referrals.   Please follow up in 12-24 months. Give us  a call at [phone] to schedule an appointment.  Suggest taking  align or probiotic for 2-4 weeks and then stop to help with post infectious IBS if any worsening symptoms like blood in stool, weight loss, please call the office or go to the ER.   You may have POST INFECTIOUS IBS OR IRRITABLE BOWEL After an infection or diverticulitis flare your intestines can spasm or be a little bit more sensitive. Try these things below:  Can do  low FODMAP- see below Try trial off milk/lactose products.  Add fiber like benefiber or citracel once a day Can do trial of IBGard for AB pain EVERY DAY- Take 1-2 capsules once a day for maintence or twice a day during a flare  FODMAP stands for fermentable oligo-, di-, mono-saccharides and polyols (1). These are the scientific terms used to classify groups of carbs that are notorious for triggering digestive symptoms like bloating, gas and stomach pain.

## 2024-08-25 ENCOUNTER — Other Ambulatory Visit: Payer: Self-pay | Admitting: Internal Medicine

## 2024-08-30 DIAGNOSIS — M9903 Segmental and somatic dysfunction of lumbar region: Secondary | ICD-10-CM | POA: Diagnosis not present

## 2024-08-30 DIAGNOSIS — M5441 Lumbago with sciatica, right side: Secondary | ICD-10-CM | POA: Diagnosis not present

## 2024-08-31 ENCOUNTER — Encounter: Payer: Self-pay | Admitting: "Endocrinology

## 2024-08-31 ENCOUNTER — Ambulatory Visit: Admitting: "Endocrinology

## 2024-08-31 DIAGNOSIS — M8589 Other specified disorders of bone density and structure, multiple sites: Secondary | ICD-10-CM | POA: Diagnosis not present

## 2024-08-31 NOTE — Progress Notes (Signed)
 [facility] Note Virginia Birmingham, MD    Referring Provider: Geofm Glade PARAS, MD Primary Care Provider: Geofm Glade PARAS, MD No chief complaint on file.    Assessment & Plan  Diagnoses and all orders for this visit:  Hypercalcemia -     PTH, intact and calcium  -     Renal function panel -     Vitamin D 1,25 dihydroxy; Future -     VITAMIN D 25 Hydroxy (Vit-D Deficiency, Fractures) -     Magnesium; Future -     DG DXA BODY COMPOSITION; Future  Osteopenia of multiple sites -     DG DXA BODY COMPOSITION; Future     Hypercalcemia with normal PTH Recommend repeat labs as well as DEXA scan Discussed importance of good hydration: 8 eight ounce glasses of fluids per day. Recommend low threshold for ER visit for IV fluids if having nausea/vomiting/diarrhea and cannot keep well hydrated to prevent severe hypercalcemia.  Osteopenia, likely age-related. 08/2020 BMD results suggest: Osteopenia at the lumbar spine as well as right and left femoral neck with a -6% since last DEXA.   Given hypercalcemia, recommend to avoid calcium , current vitamin D 2000 units once daily.   Follow fall precautions, adequate dairy in diet and exercises (aerobic, balancing and weight bearing) as tolerated. Ordered DEXA scan ordered labs  Return in about 6 weeks (around 10/12/2024) for visit, labs today.  I have reviewed current medications, nurse's notes, allergies, vital signs, past medical and surgical history, family medical history, and social history for this encounter. Counseled patient on symptoms, examination findings, lab findings, imaging results, treatment decisions and monitoring and prognosis. The patient understood the recommendations and agrees with the treatment plan. All questions regarding treatment plan were fully answered.   Virginia Birmingham, MD   08/31/24   History of Present Illness Virginia [name] is a 83 y.o. year old female who presents to our clinic with hypercalcemia  and osteopenia. She is currently not taking Calcium   and vitamin D 2000 international units once daily.  Not on lithium/hydrochlorothiazide  08/2020 Results:   Lumbar spine L1-L4 Femoral neck (FN)  T-score -2.2 RFN: -1.8 LFN: -2.2  Change in BMD from previous DXA test (%) n/a  -6%*     Risk Factors screening:  History of low trauma fractures: No Family history of osteoporosis: No Hip fracture in first-degree relatives: No Smoking history: No Excessive alcohol intake >2 drinks/day: No Excessive caffeine intake >2 drinks/day: No Glucocorticoid use >5mg  prednisone/day for >3 months: No Rheumatoid arthritis history: No Premature/Surgical Menopause: No   Anti-epileptic drugs No  Celiac disease/signs of malabsorption No  Gastric bypass/gastrectomy No  PPI use yes   TZD use No  Gonadotropin/androgen suppressing drugs No  Aromatase Inhibitors No  Thyroid  hormone suppressive therapy No  Patient denies a history of kidney stones current hematuria No polyuria No nocturia No thirst No renal failure No anorexia No abdominal pain No heartburn No, on PPI constipation No nausea or vomiting No history of peptic ulcer disease No depression No confusion No excessive fatigue No fracture No osteoporosis No headaches No numbness No tingling No  family history of renal stones/hypercalcemia No a personal history of MEN syndromes/medullary thyroid  cancer/ pheochromocytoma No  Physical Exam  BP 122/70   Pulse 74   Ht 5' 3 (1.6 m)   Wt 161 lb (73 kg)   SpO2 96%   BMI 28.52 kg/m  Constitutional: well developed, well nourished Head: normocephalic, atraumatic Eyes: sclera anicteric,  no redness Neck: supple Lungs: normal respiratory effort Neurology: alert and oriented Skin: dry, no appreciable rashes Musculoskeletal: no appreciable defects Psychiatric: normal mood and affect  Allergies Allergies  Allergen Reactions   Penicillins Hives    Did it involve swelling of  the face/tongue/throat, SOB, or low BP? No Did it involve sudden or severe rash/hives, skin peeling, or any reaction on the inside of your mouth or nose? No Did you need to seek medical attention at a hospital or doctor's office? No When did it last happen? many years ago      If all above answers are "NO", may proceed with cephalosporin use.    Statins Other (See Comments)    pain in base of thumbs   Bactrim  [Sulfamethoxazole -Trimethoprim ] Other (See Comments)    Cause stomach issues    Current Medications Patient's Medications  New Prescriptions   No medications on file  Previous Medications   CARBOXYMETHYLCELLULOSE (REFRESH PLUS) 0.5 % SOLN    Place 4 drops into both eyes daily.   CHOLECALCIFEROL (D3 HIGH POTENCY) 50 MCG (2000 UT) CAPS    Take by mouth daily.   CYCLOSPORINE (VEVYE OP)    Apply 2 drops to eye daily.   EZETIMIBE  (ZETIA ) 10 MG TABLET    TAKE 1 TABLET BY MOUTH EVERY DAY   FENOFIBRATE  (TRICOR ) 145 MG TABLET    TAKE 1 TABLET BY MOUTH EVERY DAY   MULTIPLE VITAMINS-MINERALS (AIRBORNE PO)    Take 1 tablet by mouth daily.   OMEGA-3 FATTY ACIDS (FISH OIL PO)    Take 1 tablet by mouth daily.   PANTOPRAZOLE  (PROTONIX ) 40 MG TABLET    TAKE 1 TABLET (40 MG TOTAL) BY MOUTH DAILY BEFORE BREAKFAST. TAKE 30 TO 60 MINUTES BEFORE A MEAL   PROPRANOLOL  ER (INDERAL  LA) 80 MG 24 HR CAPSULE    Take 1 capsule (80 mg total) by mouth daily.   SEMAGLUTIDE , 1 MG/DOSE, (OZEMPIC , 1 MG/DOSE,) 4 MG/3ML SOPN    INJECT 1 MG ONCE A WEEK AS DIRECTED   TELMISARTAN  (MICARDIS ) 20 MG TABLET    Take 1 tablet (20 mg total) by mouth daily.  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  Social History Social History   Socioeconomic History   Marital status: Widowed    Spouse name: Not on file   Number of children: 2   Years of education: Not on file   Highest education level: Not on file  Occupational History   Occupation: retired  Tobacco Use   Smoking status: Never   Smokeless tobacco: Never   Tobacco comments:    Married, lives with spouse. Retired Facilities manager Use   Vaping status: Never Used  Substance and Sexual Activity   Alcohol use: No    Alcohol/week: 0.0 standard drinks of alcohol   Drug use: No   Sexual activity: Not Currently  Other Topics Concern   Not on file  Social History Narrative    2 children;   One in [place] and one is in [place]   Retired Runner, broadcasting/film/video   Social Drivers of Corporate investment banker Strain: Low Risk  (08/20/2022)   Overall Financial Resource Strain (CARDIA)    Difficulty of Paying Living  Expenses: Not hard at all  Food Insecurity: No Food Insecurity (08/20/2022)   Hunger Vital Sign    Worried About Running Out of Food in the Last Year: Never true    Ran Out of Food in the Last Year: Never true  Transportation Needs: No Transportation Needs (08/20/2022)   PRAPARE - Administrator, Civil Service (Medical): No    Lack of Transportation (Non-Medical): No  Physical Activity: Sufficiently Active (08/20/2022)   Exercise Vital Sign    Days of Exercise per Week: 5 days    Minutes of Exercise per Session: 30 min  Recent Concern: Physical Activity - Inactive (08/20/2022)   Exercise Vital Sign    Days of Exercise per Week: 0 days    Minutes of Exercise per Session: 0 min  Stress: No Stress Concern Present (08/20/2022)   [employer] of Occupational Health - Occupational Stress Questionnaire    Feeling of Stress : Not at all  Social Connections: Socially Integrated (08/20/2022)   Social Connection and Isolation Panel    Frequency of Communication with Friends and Family: More than three times a week    Frequency of Social Gatherings with Friends and Family: More than three times a week    Attends Religious Services: More than 4 times per year    Active Member of [employer] or Organizations: Yes    Attends Banker Meetings: 1 to 4 times per year    Marital Status: Married  Catering manager Violence: Not At Risk (08/20/2022)   Humiliation, Afraid, Rape, and Kick questionnaire    Fear of Current or Ex-Partner: No    Emotionally Abused: No    Physically Abused: No    Sexually Abused: No    Laboratory Investigations No components found for: CMP No components found for: BMP Lab Results  Component Value Date   GFR 61.63 06/07/2024   Lab Results  Component Value Date   CREATININE 0.88 06/07/2024   No results found for: CBC No components found for: LFT No components found for: VITD Lab Results  Component Value Date  PTH 25 04/11/2024     Lab Results  Component Value Date   TSH 2.06 02/18/2023    No components found for: RENAL FUNCTION No components found for: MAGNESIUM  Parts of this note may have been dictated using voice recognition software. There may be variances in spelling and vocabulary which are unintentional. Not all errors are proofread. Please notify the dino if any discrepancies are noted or if the meaning of any statement is not clear.

## 2024-09-01 ENCOUNTER — Other Ambulatory Visit: Payer: Self-pay | Admitting: "Endocrinology

## 2024-09-01 ENCOUNTER — Ambulatory Visit: Payer: Self-pay | Admitting: "Endocrinology

## 2024-09-01 LAB — RENAL FUNCTION PANEL
Albumin: 4.1 g/dL (ref 3.6–5.1)
BUN: 22 mg/dL (ref 7–25)
CO2: 30 mmol/L (ref 20–32)
Calcium: 11.5 mg/dL — ABNORMAL HIGH (ref 8.6–10.4)
Chloride: 104 mmol/L (ref 98–110)
Creat: 0.75 mg/dL (ref 0.60–0.95)
Glucose, Bld: 89 mg/dL (ref 65–99)
Phosphorus: 3 mg/dL (ref 2.1–4.3)
Potassium: 4.6 mmol/L (ref 3.5–5.3)
Sodium: 140 mmol/L (ref 135–146)

## 2024-09-01 LAB — PTH, INTACT AND CALCIUM
Calcium: 11.5 mg/dL — ABNORMAL HIGH (ref 8.6–10.4)
PTH: 29 pg/mL — AB (ref 8.6–77)

## 2024-09-01 LAB — VITAMIN D 25 HYDROXY (VIT D DEFICIENCY, FRACTURES): Vit D, 25-Hydroxy: 48 ng/mL (ref 30–100)

## 2024-09-01 NOTE — Addendum Note (Signed)
 Addended by: Rush Salce on: 09/01/2024 01:17 PM   Modules accepted: Orders

## 2024-09-02 ENCOUNTER — Ambulatory Visit (INDEPENDENT_AMBULATORY_CARE_PROVIDER_SITE_OTHER)

## 2024-09-02 ENCOUNTER — Telehealth: Payer: Self-pay

## 2024-09-02 VITALS — Ht 63.0 in | Wt 161.0 lb

## 2024-09-02 DIAGNOSIS — Z0000 Encounter for general adult medical examination without abnormal findings: Secondary | ICD-10-CM | POA: Diagnosis not present

## 2024-09-02 NOTE — Telephone Encounter (Addendum)
 Patient wanted to know instructions from MD again. Patient given instructions and sent on Mychart. No further questions at this time.

## 2024-09-02 NOTE — Patient Instructions (Signed)
 Ms. Polsky,  Thank you for taking the time for your Medicare Wellness Visit. I appreciate your continued commitment to your health goals. Please review the care plan we discussed, and feel free to reach out if I can assist you further.  Medicare recommends these wellness visits once per year to help you and your care team stay ahead of potential health issues. These visits are designed to focus on prevention, allowing your provider to concentrate on managing your acute and chronic conditions during your regular appointments.  Please note that Annual Wellness Visits do not include a physical exam. Some assessments may be limited, especially if the visit was conducted virtually. If needed, we may recommend a separate in-person follow-up with your provider.  Ongoing Care Seeing your primary care provider every 3 to 6 months helps us  monitor your health and provide consistent, personalized care. Next office visit on 09/05/2024.  You are due for a Flu vaccine and a Tetanus vaccine.  You are also due for a foot exam and a A1C check, which you will receive during your up coming office visit here.  Referrals If a referral was made during today's visit and you haven't received any updates within two weeks, please contact the referred provider directly to check on the status.  Recommended Screenings:  Health Maintenance  Topic Date Due   Complete foot exam   Never done   DTaP/Tdap/Td vaccine (2 - Tdap) 10/01/2016   Medicare Annual Wellness Visit  08/21/2023   Flu Shot  07/01/2024   COVID-19 Vaccine (4 - 2025-26 season) 08/01/2024   Eye exam for diabetics  08/11/2024   Hemoglobin A1C  08/25/2024   Yearly kidney health urinalysis for diabetes  02/22/2025   Yearly kidney function blood test for diabetes  08/31/2025   Pneumococcal Vaccine for age over 32  Completed   Zoster (Shingles) Vaccine  Completed   HPV Vaccine  Aged Out   Meningitis B Vaccine  Aged Out   DEXA scan (bone density  measurement)  Discontinued   Colon Cancer Screening  Discontinued   Hepatitis C Screening  Discontinued       09/02/2024    8:55 AM  Advanced Directives  Does Patient Have a Medical Advance Directive? Yes  Type of Estate agent of [place];Living will   Advance Care Planning is important because it: Ensures you receive medical care that aligns with your values, goals, and preferences. Provides guidance to your family and loved ones, reducing the emotional burden of decision-making during critical moments.  Vision: Annual vision screenings are recommended for early detection of glaucoma, cataracts, and diabetic retinopathy. These exams can also reveal signs of chronic conditions such as diabetes and high blood pressure.  Dental: Annual dental screenings help detect early signs of oral cancer, gum disease, and other conditions linked to overall health, including heart disease and diabetes.  Please see the attached documents for additional preventive care recommendations.

## 2024-09-02 NOTE — Progress Notes (Signed)
 Subjective:   Virginia [name] is a 83 y.o. who presents for a Medicare Wellness preventive visit.  As a reminder, Annual Wellness Visits don't include a physical exam, and some assessments may be limited, especially if this visit is performed virtually. We may recommend an in-person follow-up visit with your provider if needed.  Visit Complete: Virtual I connected with  Virginia [name] on 09/02/24 by a audio enabled telemedicine application and verified that I am speaking with the correct person using two identifiers.  Patient Location: Home  Provider Location: Office/Clinic  I discussed the limitations of evaluation and management by telemedicine. The patient expressed understanding and agreed to proceed.  Vital Signs: Because this visit was a virtual/telehealth visit, some criteria may be missing or patient reported. Any vitals not documented were not able to be obtained and vitals that have been documented are patient reported.  VideoDeclined- This patient declined Librarian, academic. Therefore the visit was completed with audio only.  Persons Participating in Visit: Patient.  AWV Questionnaire: No: Patient Medicare AWV questionnaire was not completed prior to this visit.  Cardiac Risk Factors include: advanced age (>61men, >36 women);diabetes mellitus;dyslipidemia;hypertension;Other (see comment)     Objective:    Today's Vitals   09/02/24 0844  Weight: 161 lb (73 kg)  Height: 5' 3 (1.6 m)   Body mass index is 28.52 kg/m.     09/02/2024    8:55 AM 05/09/2024    7:40 PM 04/12/2024    7:55 PM 10/13/2022    2:09 PM 08/20/2022   10:25 AM 07/29/2019   12:19 PM 05/25/2019    8:42 PM  Advanced Directives  Does Patient Have a Medical Advance Directive? Yes No No Yes Yes Yes Yes  Type of Estate agent of [place];Living will    Healthcare Power of [place];Living will Healthcare Power of [place];Living will Healthcare Power  of Attorney  Does patient want to make changes to medical advance directive?       No - Patient declined   Copy of Healthcare Power of Attorney in Chart?     No - copy requested No - copy requested No - copy requested   Would patient like information on creating a medical advance directive?  No - Patient declined          Data saved with a previous flowsheet row definition    Current Medications (verified) Outpatient Encounter Medications as of 09/02/2024  Medication Sig   carboxymethylcellulose (REFRESH PLUS) 0.5 % SOLN Place 4 drops into both eyes daily.   Cholecalciferol (D3 HIGH POTENCY) 50 MCG (2000 UT) CAPS Take by mouth daily.   cycloSPORINE (VEVYE OP) Apply 2 drops to eye daily.   ezetimibe  (ZETIA ) 10 MG tablet TAKE 1 TABLET BY MOUTH EVERY DAY   fenofibrate  (TRICOR ) 145 MG tablet TAKE 1 TABLET BY MOUTH EVERY DAY   Multiple Vitamins-Minerals (AIRBORNE PO) Take 1 tablet by mouth daily.   Omega-3 Fatty Acids (FISH OIL PO) Take 1 tablet by mouth daily.   pantoprazole  (PROTONIX ) 40 MG tablet TAKE 1 TABLET (40 MG TOTAL) BY MOUTH DAILY BEFORE BREAKFAST. TAKE 30 TO 60 MINUTES BEFORE A MEAL   propranolol  ER (INDERAL  LA) 80 MG 24 hr capsule Take 1 capsule (80 mg total) by mouth daily.   Semaglutide , 1 MG/DOSE, (OZEMPIC , 1 MG/DOSE,) 4 MG/3ML SOPN INJECT 1 MG ONCE A WEEK AS DIRECTED   telmisartan  (MICARDIS ) 20 MG tablet Take 1 tablet (20 mg total) by mouth daily.  No facility-administered encounter medications on file as of 09/02/2024.    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   s/p dilation   Scoliosis    Tachycardia    Tubular adenoma of colon    Past Surgical History:  Procedure Laterality Date   child birth  73 & 65   COLONOSCOPY     ESOPHAGEAL DILATION N/A    Fibroidectomy     Low back surgery  01/1994   2 bulging disc   POLYPECTOMY     TONSILLECTOMY  1968   UPPER GASTROINTESTINAL ENDOSCOPY     Family History  Problem Relation Age of Onset   Arthritis Mother    Coronary artery disease Father    Hyperlipidemia Brother    Diabetes Maternal Aunt    Diabetes Paternal Aunt    Hyperlipidemia Cousin    Heart disease Cousin    Asthma Son    Colon cancer Neg Hx    Colon polyps Neg Hx    Esophageal cancer Neg Hx    Stomach cancer Neg Hx    Rectal cancer Neg Hx    Social History   Socioeconomic History   Marital status: Widowed    Spouse name: Not on file   Number of children: 2   Years of education: Not on file   Highest education level: Not on file  Occupational History   Occupation: retired  Tobacco Use   Smoking status: Never   Smokeless tobacco: Never   Tobacco comments:    Married, lives with spouse. Retired Facilities manager Use   Vaping status: Never Used  Substance and Sexual Activity   Alcohol use: No    Alcohol/week: 0.0 standard drinks of alcohol   Drug use: No   Sexual activity: Not Currently  Other Topics Concern   Not on file  Social History Narrative    2 children;   One in [place] and one is in [place]   Retired Runner, broadcasting/film/video   Lives alone/2025   Social Drivers of Health   Financial Resource Strain: Low Risk  (09/02/2024)   Overall Financial Resource Strain (CARDIA)    Difficulty of  Paying Living Expenses: Not hard at all  Food Insecurity: No Food Insecurity (09/02/2024)   Hunger Vital Sign    Worried About Running Out of Food in the Last Year: Never true    Ran Out of Food in the Last Year: Never true  Transportation Needs: No Transportation Needs (09/02/2024)   PRAPARE - Administrator, Civil Service (Medical): No    Lack of Transportation (Non-Medical): No  Physical  Activity: Inactive (09/02/2024)   Exercise Vital Sign    Days of Exercise per Week: 0 days    Minutes of Exercise per Session: 0 min  Stress: Stress Concern Present (09/02/2024)   [employer] of Occupational Health - Occupational Stress Questionnaire    Feeling of Stress: To some extent  Social Connections: Moderately Integrated (09/02/2024)   Social Connection and Isolation Panel    Frequency of Communication with Friends and Family: More than three times a week    Frequency of Social Gatherings with Friends and Family: Once a week    Attends Religious Services: More than 4 times per year    Active Member of [employer] or Organizations: Yes    Attends Banker Meetings: More than 4 times per year    Marital Status: Widowed    Tobacco Counseling Counseling given: Not Answered Tobacco comments: Married, lives with spouse. Retired Pensions consultant    Clinical Intake:  Pre-visit preparation completed: Yes  Pain : No/denies pain     BMI - recorded: 28.52 Nutritional Status: BMI 25 -29 Overweight Nutritional Risks: None Diabetes: Yes CBG done?: No Did pt. bring in CBG monitor from home?: No  Lab Results  Component Value Date   HGBA1C 5.8 02/23/2024   HGBA1C 5.7 08/26/2023   HGBA1C 5.9 02/18/2023     How often do you need to have someone help you when you read instructions, pamphlets, or other written materials from your doctor or pharmacy?: 1 - Never  Interpreter Needed?: No  Information entered by :: Shenouda Genova, RMA   Activities of Daily Living      09/02/2024    8:44 AM  In your present state of health, do you have any difficulty performing the following activities:  Hearing? 0  Vision? 0  Difficulty concentrating or making decisions? 0  Walking or climbing stairs? 0  Dressing or bathing? 0  Doing errands, shopping? 0  Preparing Food and eating ? N  Using the Toilet? N  In the past six months, have you accidently leaked urine? N  Do you have problems with loss of bowel control? N  Managing your Medications? N  Managing your Finances? N  Housekeeping or managing your Housekeeping? N    Patient Care Team: Geofm Glade PARAS, MD as PCP - General (Internal Medicine) Elmira Newman PARAS, MD as PCP - Cardiology (Cardiology) Kit Rush, MD as Consulting Physician (Orthopedic Surgery) Pyrtle, Gordy HERO, MD as Consulting Physician (Gastroenterology) Cleatus Collar, MD as Consulting Physician (Ophthalmology)  I have updated your Care Teams any recent Medical Services you may have received from other providers in the past year.     Assessment:   This is a routine wellness examination for Virginia [name].  Hearing/Vision screen Hearing Screening - Comments:: Denies hearing difficulties   Vision Screening - Comments:: Wears eyeglasses/[facility]/Dr. Fleeta   Goals Addressed             This Visit's Progress    Patient Stated   On track    Increase my physical activity and monitor my diet to be healthier.       Depression Screen     09/02/2024    9:00 AM 04/18/2024    1:22 PM 02/18/2023   11:07 AM 01/26/2023    1:01 PM 01/15/2023   11:20 AM 08/20/2022   10:07 AM 02/17/2022   10:32 AM  PHQ 2/9 Scores  PHQ - 2 Score 0 0 0 0 0 0 0  PHQ- 9 Score  1  0 0 0      Fall Risk     09/02/2024    8:56 AM 04/18/2024    1:21 PM 02/18/2023   11:07 AM 01/26/2023    1:00 PM 01/15/2023   11:20 AM  Fall Risk   Falls in the past year? 1 0 0 0 0  Comment hiking      Number falls in past yr: 0 0 0 0 0  Injury with Fall? 0 0 0 0 0  Risk for fall due  to :  No Fall Risks No Fall Risks No Fall Risks   Follow up Falls evaluation completed;Falls prevention discussed Falls evaluation completed Falls evaluation completed Falls evaluation completed Falls evaluation completed    MEDICARE RISK AT HOME:  Medicare Risk at Home Any stairs in or around the home?: Yes (2 steps outside) If so, are there any without handrails?: Yes Home free of loose throw rugs in walkways, pet beds, electrical cords, etc?: Yes Adequate lighting in your home to reduce risk of falls?: Yes Life alert?: No Use of a cane, walker or w/c?: No Grab bars in the bathroom?: Yes Shower chair or bench in shower?: Yes Elevated toilet seat or a handicapped toilet?: Yes  TIMED UP AND GO:  Was the test performed?  No  Cognitive Function: Declined/Normal: No cognitive concerns noted by patient or family. Patient alert, oriented, able to answer questions appropriately and recall recent events. No signs of memory loss or confusion.    05/03/2015   11:07 AM  MMSE - Mini Mental State Exam  Not completed: Unable to complete      Significant value        08/20/2022   10:10 AM  6CIT Screen  What Year? 0 points  What month? 0 points  What time? 0 points  Count back from 20 0 points  Months in reverse 0 points  Repeat phrase 0 points  Total Score 0 points    Immunizations Immunization History  Administered Date(s) Administered   Fluad Quad(high Dose 65+) 07/29/2019, 08/19/2021, 08/20/2022   Fluad Trivalent(High Dose 65+) 08/26/2023   INFLUENZA, HIGH DOSE SEASONAL PF 08/31/2013, 10/20/2014, 10/17/2015, 11/12/2016, 12/16/2017   Influenza Split 10/01/2012   Influenza Whole 10/01/2009   Influenza-Unspecified 10/04/2018   PFIZER(Purple Top)SARS-COV-2 Vaccination 01/07/2020, 02/01/2020   Pfizer Covid-19 Vaccine Bivalent Booster 62yrs & up 11/28/2021   Pneumococcal Conjugate-13 08/16/2015   Pneumococcal Polysaccharide-23 05/13/2017   Td 10/01/2006   Zoster  Recombinant(Shingrix) 01/12/2017, 08/20/2017   Zoster, Live 02/09/2008    Screening Tests Health Maintenance  Topic Date Due   FOOT EXAM  Never done   DTaP/Tdap/Td (2 - Tdap) 10/01/2016   Influenza Vaccine  07/01/2024   COVID-19 Vaccine (4 - 2025-26 season) 08/01/2024   OPHTHALMOLOGY EXAM  08/11/2024   HEMOGLOBIN A1C  08/25/2024   Diabetic kidney evaluation - Urine ACR  02/22/2025   Diabetic kidney evaluation - eGFR measurement  08/31/2025   Medicare Annual Wellness (AWV)  09/02/2025   Pneumococcal Vaccine: 50+ Years  Completed   Zoster Vaccines- Shingrix  Completed   HPV VACCINES  Aged Out   Meningococcal B Vaccine  Aged Out   DEXA SCAN  Discontinued   Colonoscopy  Discontinued   Hepatitis C Screening  Discontinued    Health Maintenance Items Addressed: Diabetic Foot Exam recommended, Labs Due A1c check, See Nurse Notes at the end of this note  Additional Screening:  Vision Screening: Recommended annual ophthalmology exams for early detection of glaucoma  and other disorders of the eye. Is the patient up to date with their annual eye exam?  No  Who is the provider or what is the name of the office in which the patient attends annual eye exams? [facility]/ Dr. Fleeta  Dental Screening: Recommended annual dental exams for proper oral hygiene  Community Resource Referral / Chronic Care Management: CRR required this visit?  No   CCM required this visit?  Appt scheduled with PCP   Plan:    I have personally reviewed and noted the following in the patient's chart:   Medical and social history Use of alcohol, tobacco or illicit drugs  Current medications and supplements including opioid prescriptions. Patient is not currently taking opioid prescriptions. Functional ability and status Nutritional status Physical activity Advanced directives List of other physicians Hospitalizations, surgeries, and ER visits in previous 12 months Vitals Screenings to include cognitive,  depression, and falls Referrals and appointments  In addition, I have reviewed and discussed with patient certain preventive protocols, quality metrics, and best practice recommendations. A written personalized care plan for preventive services as well as general preventive health recommendations were provided to patient.   Virginia [name] Adhira Jamil, CMA   09/02/2024   After Visit Summary: (MyChart) Due to this being a telephonic visit, the after visit summary with patients personalized plan was offered to patient via MyChart   Notes: Patient is due for a Flu vaccine and a Tdap.  She is due for a foot exam and a A1C, which can be done during her up coming office visit with Dr. Geofm.  Patient stated that she just recently had a eye exam.  I have sent a request for record out today.  She had no other concerns to address today.

## 2024-09-04 ENCOUNTER — Encounter: Payer: Self-pay | Admitting: Internal Medicine

## 2024-09-04 NOTE — Patient Instructions (Addendum)
 Flu immunization administered today.     Blood work was ordered.       Medications changes include :   None     Return in about 6 months (around 03/06/2025) for follow up.    Health Maintenance, Female Adopting a healthy lifestyle and getting preventive care are important in promoting health and wellness. Ask your health care provider about: The right schedule for you to have regular tests and exams. Things you can do on your own to prevent diseases and keep yourself healthy. What should I know about diet, weight, and exercise? Eat a healthy diet  Eat a diet that includes plenty of vegetables, fruits, low-fat dairy products, and lean protein. Do not eat a lot of foods that are high in solid fats, added sugars, or sodium. Maintain a healthy weight Body mass index (BMI) is used to identify weight problems. It estimates body fat based on height and weight. Your health care provider can help determine your BMI and help you achieve or maintain a healthy weight. Get regular exercise Get regular exercise. This is one of the most important things you can do for your health. Most adults should: Exercise for at least 150 minutes each week. The exercise should increase your heart rate and make you sweat (moderate-intensity exercise). Do strengthening exercises at least twice a week. This is in addition to the moderate-intensity exercise. Spend less time sitting. Even light physical activity can be beneficial. Watch cholesterol and blood lipids Have your blood tested for lipids and cholesterol at 81 years of age, then have this test every 5 years. Have your cholesterol levels checked more often if: Your lipid or cholesterol levels are high. You are older than 81 years of age. You are at high risk for heart disease. What should I know about cancer screening? Depending on your health history and family history, you may need to have cancer screening at various ages. This may include  screening for: Breast cancer. Cervical cancer. Colorectal cancer. Skin cancer. Lung cancer. What should I know about heart disease, diabetes, and high blood pressure? Blood pressure and heart disease High blood pressure causes heart disease and increases the risk of stroke. This is more likely to develop in people who have high blood pressure readings or are overweight. Have your blood pressure checked: Every 3-5 years if you are 88-76 years of age. Every year if you are 59 years old or older. Diabetes Have regular diabetes screenings. This checks your fasting blood sugar level. Have the screening done: Once every three years after age 8 if you are at a normal weight and have a low risk for diabetes. More often and at a younger age if you are overweight or have a high risk for diabetes. What should I know about preventing infection? Hepatitis B If you have a higher risk for hepatitis B, you should be screened for this virus. Talk with your health care provider to find out if you are at risk for hepatitis B infection. Hepatitis C Testing is recommended for: Everyone born from [birth year] through 1963. Anyone with known risk factors for hepatitis C. Sexually transmitted infections (STIs) Get screened for STIs, including gonorrhea and chlamydia, if: You are sexually active and are younger than 81 years of age. You are older than 81 years of age and your health care provider tells you that you are at risk for this type of infection. Your sexual activity has changed since you were last screened, and you are  at increased risk for chlamydia or gonorrhea. Ask your health care provider if you are at risk. Ask your health care provider about whether you are at high risk for HIV. Your health care provider may recommend a prescription medicine to help prevent HIV infection. If you choose to take medicine to prevent HIV, you should first get tested for HIV. You should then be tested every 3 months for as  long as you are taking the medicine. Pregnancy If you are about to stop having your period (premenopausal) and you may become pregnant, seek counseling before you get pregnant. Take 400 to 800 micrograms (mcg) of folic acid every day if you become pregnant. Ask for birth control (contraception) if you want to prevent pregnancy. Osteoporosis and menopause Osteoporosis is a disease in which the bones lose minerals and strength with aging. This can result in bone fractures. If you are 22 years old or older, or if you are at risk for osteoporosis and fractures, ask your health care provider if you should: Be screened for bone loss. Take a calcium  or vitamin D supplement to lower your risk of fractures. Be given hormone replacement therapy (HRT) to treat symptoms of menopause. Follow these instructions at home: Alcohol use Do not drink alcohol if: Your health care provider tells you not to drink. You are pregnant, may be pregnant, or are planning to become pregnant. If you drink alcohol: Limit how much you have to: 0-1 drink a day. Know how much alcohol is in your drink. In the [place] one drink equals one 12 oz bottle of beer (355 mL), one 5 oz glass of wine (148 mL), or one 1 oz glass of hard liquor (44 mL). Lifestyle Do not use any products that contain nicotine or tobacco. These products include cigarettes, chewing tobacco, and vaping devices, such as e-cigarettes. If you need help quitting, ask your health care provider. Do not use street drugs. Do not share needles. Ask your health care provider for help if you need support or information about quitting drugs. General instructions Schedule regular health, dental, and eye exams. Stay current with your vaccines. Tell your health care provider if: You often feel depressed. You have ever been abused or do not feel safe at home. Summary Adopting a healthy lifestyle and getting preventive care are important in promoting health and  wellness. Follow your health care provider's instructions about healthy diet, exercising, and getting tested or screened for diseases. Follow your health care provider's instructions on monitoring your cholesterol and blood pressure. This information is not intended to replace advice given to you by your health care provider. Make sure you discuss any questions you have with your health care provider. Document Revised: 04/08/2021 Document Reviewed: 04/08/2021 Elsevier Patient Education  2024 [employer].

## 2024-09-04 NOTE — Progress Notes (Unsigned)
 Subjective:    Patient ID: Virginia [name], female    DOB: March 26, 1941, 83 y.o.   MRN: [id number]      HPI Virginia [name] is here for a Physical exam and her chronic medical problems.    Overall doing well.  Just returned from [place]  where she saw her brothers and nephews.  Has been seeing a lot of doctors.  No major concerns.  Medications and allergies reviewed with patient and updated if appropriate.  Current Outpatient Medications on File Prior to Visit  Medication Sig Dispense Refill   carboxymethylcellulose (REFRESH PLUS) 0.5 % SOLN Place 4 drops into both eyes daily.     Cholecalciferol (D3 HIGH POTENCY) 50 MCG (2000 UT) CAPS Take by mouth daily.     cycloSPORINE (VEVYE OP) Apply 2 drops to eye daily.     ezetimibe  (ZETIA ) 10 MG tablet TAKE 1 TABLET BY MOUTH EVERY DAY 90 tablet 3   fenofibrate  (TRICOR ) 145 MG tablet TAKE 1 TABLET BY MOUTH EVERY DAY 90 tablet 1   Multiple Vitamins-Minerals (AIRBORNE PO) Take 1 tablet by mouth daily.     Omega-3 Fatty Acids (FISH OIL PO) Take 1 tablet by mouth daily.     pantoprazole  (PROTONIX ) 40 MG tablet TAKE 1 TABLET (40 MG TOTAL) BY MOUTH DAILY BEFORE BREAKFAST. TAKE 30 TO 60 MINUTES BEFORE A MEAL 90 tablet 1   propranolol  ER (INDERAL  LA) 80 MG 24 hr capsule Take 1 capsule (80 mg total) by mouth daily. 90 capsule 1   Semaglutide , 1 MG/DOSE, (OZEMPIC , 1 MG/DOSE,) 4 MG/3ML SOPN INJECT 1 MG ONCE A WEEK AS DIRECTED 3 mL 0   telmisartan  (MICARDIS ) 20 MG tablet Take 1 tablet (20 mg total) by mouth daily. 30 tablet 5   No current facility-administered medications on file prior to visit.    Review of Systems  Constitutional:  Negative for fever.  Eyes:  Negative for visual disturbance (blurry vision from dry eye).  Respiratory:  Positive for cough (drainage or allergies). Negative for shortness of breath and wheezing.   Cardiovascular:  Negative for chest pain, palpitations and leg swelling.  Gastrointestinal:  Negative for abdominal pain, blood  in stool, constipation and diarrhea.       No gerd  Genitourinary:  Negative for dysuria.  Musculoskeletal:  Positive for back pain (chronic - stable - chiropractor, massage monthly). Negative for arthralgias.  Skin:  Negative for rash.  Neurological:  Negative for light-headedness and headaches.  Psychiatric/Behavioral:  Negative for dysphoric mood. The patient is not nervous/anxious.        Objective:   Vitals:   09/05/24 1026  BP: 130/60  Pulse: 66  Temp: (!) 97.5 F (36.4 C)  SpO2: 97%   Filed Weights   09/05/24 1026  Weight: 162 lb 6.4 oz (73.7 kg)   Body mass index is 28.77 kg/m.  BP Readings from Last 3 Encounters:  09/05/24 130/60  08/31/24 122/70  08/22/24 130/66    Wt Readings from Last 3 Encounters:  09/05/24 162 lb 6.4 oz (73.7 kg)  09/02/24 161 lb (73 kg)  08/31/24 161 lb (73 kg)       Physical Exam Constitutional: She appears well-developed and well-nourished. No distress.  HENT:  Head: Normocephalic and atraumatic.  Right Ear: External ear normal. Normal ear canal and TM Left Ear: External ear normal.  Normal ear canal and TM Mouth/Throat: Oropharynx is clear and moist.  Eyes: Conjunctivae normal.  Neck: Neck supple. No tracheal deviation present. No thyromegaly present.  No carotid bruit  Cardiovascular: Normal rate, regular rhythm and normal heart sounds.   No murmur heard.  No edema. Pulmonary/Chest: Effort normal and breath sounds normal. No respiratory distress. She has no wheezes. She has no rales.  Breast: deferred   Abdominal: Soft. She exhibits no distension. There is no tenderness.  Lymphadenopathy: She has no cervical adenopathy.  Skin: Skin is warm and dry. She is not diaphoretic.  Psychiatric: She has a normal mood and affect. Her behavior is normal.   Diabetic Foot Exam - Simple   Simple Foot Form Diabetic Foot exam was performed with the following findings: Yes 09/05/2024 11:04 AM  Visual Inspection No deformities, no  ulcerations, no other skin breakdown bilaterally: Yes Sensation Testing Intact to touch and monofilament testing bilaterally: Yes Pulse Check Posterior Tibialis and Dorsalis pulse intact bilaterally: Yes Comments      Lab Results  Component Value Date   WBC 7.5 05/09/2024   HGB 14.6 05/09/2024   HCT 43.2 05/09/2024   PLT 217 05/09/2024   GLUCOSE 89 08/31/2024   CHOL 174 02/23/2024   TRIG 206.0 (H) 02/23/2024   HDL 42.90 02/23/2024   LDLDIRECT 134.0 02/18/2023   LDLCALC 90 02/23/2024   ALT 13 05/09/2024   AST 18 05/09/2024   NA 140 08/31/2024   K 4.6 08/31/2024   CL 104 08/31/2024   CREATININE 0.75 08/31/2024   BUN 22 08/31/2024   CO2 30 08/31/2024   TSH 2.06 02/18/2023   INR 1.0 05/25/2019   HGBA1C 5.8 02/23/2024   MICROALBUR 1.0 02/23/2024         Assessment & Plan:   Physical exam: Screening blood work  ordered Exercise  not formally - no regimented exercise, active-stressed the importance of regular activity and exercise Weight  overweight Substance abuse  none   Reviewed recommended immunizations.   Health Maintenance  Topic Date Due   DTaP/Tdap/Td (2 - Tdap) 10/01/2016   OPHTHALMOLOGY EXAM  08/11/2024   HEMOGLOBIN A1C  08/25/2024   COVID-19 Vaccine (4 - 2025-26 season) 09/21/2024 (Originally 08/01/2024)   Diabetic kidney evaluation - Urine ACR  02/22/2025   Diabetic kidney evaluation - eGFR measurement  08/31/2025   Medicare Annual Wellness (AWV)  09/02/2025   FOOT EXAM  09/05/2025   Pneumococcal Vaccine: 50+ Years  Completed   Influenza Vaccine  Completed   Zoster Vaccines- Shingrix  Completed   Meningococcal B Vaccine  Aged Out   DEXA SCAN  Discontinued   Colonoscopy  Discontinued   Hepatitis C Screening  Discontinued          See Problem List for Assessment and Plan of chronic medical problems.

## 2024-09-05 ENCOUNTER — Ambulatory Visit: Admitting: "Endocrinology

## 2024-09-05 ENCOUNTER — Ambulatory Visit: Admitting: Internal Medicine

## 2024-09-05 VITALS — BP 130/60 | HR 66 | Temp 97.5°F | Ht 63.0 in | Wt 162.4 lb

## 2024-09-05 DIAGNOSIS — E1159 Type 2 diabetes mellitus with other circulatory complications: Secondary | ICD-10-CM | POA: Diagnosis not present

## 2024-09-05 DIAGNOSIS — Z23 Encounter for immunization: Secondary | ICD-10-CM

## 2024-09-05 DIAGNOSIS — K219 Gastro-esophageal reflux disease without esophagitis: Secondary | ICD-10-CM | POA: Diagnosis not present

## 2024-09-05 DIAGNOSIS — E1169 Type 2 diabetes mellitus with other specified complication: Secondary | ICD-10-CM | POA: Diagnosis not present

## 2024-09-05 DIAGNOSIS — G720 Drug-induced myopathy: Secondary | ICD-10-CM | POA: Diagnosis not present

## 2024-09-05 DIAGNOSIS — M8589 Other specified disorders of bone density and structure, multiple sites: Secondary | ICD-10-CM

## 2024-09-05 DIAGNOSIS — E785 Hyperlipidemia, unspecified: Secondary | ICD-10-CM | POA: Diagnosis not present

## 2024-09-05 DIAGNOSIS — I152 Hypertension secondary to endocrine disorders: Secondary | ICD-10-CM

## 2024-09-05 DIAGNOSIS — T466X5A Adverse effect of antihyperlipidemic and antiarteriosclerotic drugs, initial encounter: Secondary | ICD-10-CM

## 2024-09-05 DIAGNOSIS — Z7985 Long-term (current) use of injectable non-insulin antidiabetic drugs: Secondary | ICD-10-CM

## 2024-09-05 DIAGNOSIS — Z0000 Encounter for general adult medical examination without abnormal findings: Secondary | ICD-10-CM | POA: Diagnosis not present

## 2024-09-05 LAB — CBC
HCT: 43.1 % (ref 36.0–46.0)
Hemoglobin: 14.2 g/dL (ref 12.0–15.0)
MCHC: 33 g/dL (ref 30.0–36.0)
MCV: 89 fl (ref 78.0–100.0)
Platelets: 206 K/uL (ref 150.0–400.0)
RBC: 4.84 Mil/uL (ref 3.87–5.11)
RDW: 13.8 % (ref 11.5–15.5)
WBC: 6.4 K/uL (ref 4.0–10.5)

## 2024-09-05 LAB — LIPID PANEL
Cholesterol: 180 mg/dL (ref 0–200)
HDL: 46 mg/dL (ref >39.00)
LDL Cholesterol: 105 mg/dL — ABNORMAL HIGH (ref 0–99)
NonHDL: 134.03
Total CHOL/HDL Ratio: 4
Triglycerides: 146 mg/dL (ref 0.0–149.0)
VLDL: 29.2 mg/dL (ref 0.0–40.0)

## 2024-09-05 LAB — COMPREHENSIVE METABOLIC PANEL WITH GFR
ALT: 12 U/L (ref 0–35)
AST: 15 U/L (ref 0–37)
Albumin: 4.3 g/dL (ref 3.5–5.2)
Alkaline Phosphatase: 52 U/L (ref 39–117)
BUN: 14 mg/dL (ref 6–23)
CO2: 25 meq/L (ref 19–32)
Calcium: 11.2 mg/dL — ABNORMAL HIGH (ref 8.4–10.5)
Chloride: 105 meq/L (ref 96–112)
Creatinine, Ser: 0.78 mg/dL (ref 0.40–1.20)
GFR: 71.11 mL/min (ref >60.00)
Glucose, Bld: 77 mg/dL (ref 70–99)
Potassium: 5 meq/L (ref 3.5–5.1)
Sodium: 142 meq/L (ref 135–145)
Total Bilirubin: 0.7 mg/dL (ref 0.2–1.2)
Total Protein: 6.9 g/dL (ref 6.0–8.3)

## 2024-09-05 LAB — TSH: TSH: 3.18 u[IU]/mL (ref 0.35–5.50)

## 2024-09-05 LAB — HEMOGLOBIN A1C: Hgb A1c MFr Bld: 5.7 % (ref 4.6–6.5)

## 2024-09-05 NOTE — Assessment & Plan Note (Signed)
 Chronic Blood pressure controlled Continue metoprolol  50 mg bid, telmisartan  20 mg daily Monitor BP at home  CMP, CBC

## 2024-09-05 NOTE — Assessment & Plan Note (Signed)
 Chronic Regular exercise and healthy diet encouraged Check lipid panel today Statin intolerant-did not tolerate Crestor or pitavastatin Continue Zetia 10 mg daily Continue fenofibrate 145 mg daily for triglycerides-with continued weight loss and exercise, change in diet should be able to discontinue fenofibrate

## 2024-09-05 NOTE — Assessment & Plan Note (Signed)
Chronic Following with GI GERD controlled Continue pantoprazole 40 mg daily

## 2024-09-05 NOTE — Assessment & Plan Note (Signed)
 Chronic Following with endocrine-just saw them Calcium  persistently high Not taking any calcium  supplements

## 2024-09-05 NOTE — Assessment & Plan Note (Signed)
 Chronic Intolerant of Crestor  and pitavastatin  secondary to muscle and joint pain Continue Zetia , fenofibrate 

## 2024-09-05 NOTE — Assessment & Plan Note (Signed)
 Chronic Associated with hyperlipidemia  Lab Results  Component Value Date   HGBA1C 5.8 02/23/2024   Sugars well controlled Check A1c, urine microalbumin Continue Ozempic   1 mg weekly  Stressed regular exercise, diabetic diet

## 2024-09-05 NOTE — Assessment & Plan Note (Signed)
 Chronic DEXA-deferred Continue vitamin D, calcium  supplementation Stressed regular exercise

## 2024-09-08 ENCOUNTER — Ambulatory Visit: Payer: Self-pay | Admitting: Internal Medicine

## 2024-09-21 DIAGNOSIS — M5441 Lumbago with sciatica, right side: Secondary | ICD-10-CM | POA: Diagnosis not present

## 2024-09-21 DIAGNOSIS — M9903 Segmental and somatic dysfunction of lumbar region: Secondary | ICD-10-CM | POA: Diagnosis not present

## 2024-09-22 ENCOUNTER — Other Ambulatory Visit: Payer: Self-pay | Admitting: Internal Medicine

## 2024-09-27 DIAGNOSIS — H04123 Dry eye syndrome of bilateral lacrimal glands: Secondary | ICD-10-CM | POA: Diagnosis not present

## 2024-10-05 ENCOUNTER — Other Ambulatory Visit (HOSPITAL_COMMUNITY): Payer: Self-pay | Admitting: Surgery

## 2024-10-05 DIAGNOSIS — E213 Hyperparathyroidism, unspecified: Secondary | ICD-10-CM

## 2024-10-12 DIAGNOSIS — M9903 Segmental and somatic dysfunction of lumbar region: Secondary | ICD-10-CM | POA: Diagnosis not present

## 2024-10-12 DIAGNOSIS — M5441 Lumbago with sciatica, right side: Secondary | ICD-10-CM | POA: Diagnosis not present

## 2024-10-17 ENCOUNTER — Ambulatory Visit (HOSPITAL_BASED_OUTPATIENT_CLINIC_OR_DEPARTMENT_OTHER)
Admission: RE | Admit: 2024-10-17 | Discharge: 2024-10-17 | Disposition: A | Discharge status: Home / Self Care | Source: Ambulatory Visit | Attending: "Endocrinology | Admitting: "Endocrinology

## 2024-10-17 DIAGNOSIS — M8589 Other specified disorders of bone density and structure, multiple sites: Secondary | ICD-10-CM | POA: Insufficient documentation

## 2024-10-17 DIAGNOSIS — M810 Age-related osteoporosis without current pathological fracture: Secondary | ICD-10-CM | POA: Diagnosis not present

## 2024-10-17 DIAGNOSIS — Z780 Asymptomatic menopausal state: Secondary | ICD-10-CM | POA: Diagnosis not present

## 2024-10-18 ENCOUNTER — Encounter: Payer: Self-pay | Admitting: "Endocrinology

## 2024-10-18 ENCOUNTER — Ambulatory Visit: Admitting: "Endocrinology

## 2024-10-18 DIAGNOSIS — M810 Age-related osteoporosis without current pathological fracture: Secondary | ICD-10-CM | POA: Diagnosis not present

## 2024-10-18 MED ORDER — ALENDRONATE SODIUM 70 MG PO TABS
70.0000 mg | ORAL_TABLET | ORAL | 11 refills | Status: AC
Start: 2024-10-18 — End: ?

## 2024-10-18 NOTE — Progress Notes (Signed)
 [facility] Note Virginia Birmingham, MD    Referring Provider: Geofm Glade PARAS, MD Primary Care Provider: Geofm Glade PARAS, MD No chief complaint on file.  Assessment & Plan  Diagnoses and all orders for this visit:  Hypercalcemia  Age-related osteoporosis without current pathological fracture  Other orders -     alendronate (FOSAMAX) 70 MG tablet; Take 1 tablet (70 mg total) by mouth every 7 (seven) days. Take with a full glass of water on an empty stomach.   Hypercalcemia with normal PTH 10/05/24: S/p evaluation with Krystal Banner up pending  Discussed importance of good hydration: 8 eight ounce glasses of fluids per day. Recommend low threshold for ER visit for IV fluids if having nausea/vomiting/diarrhea and cannot keep well hydrated to prevent severe hypercalcemia.  Osteopenia in 2021 advanced to osteoporosis now Likely age-related  Discussed treatment options and S/E including but not limited to esophagitis/worsening GERD, atypical femoral fractures and osteonecrosis of the jaw. Advised to take medication first thing in the morning with plenty of water and stay upright for 30 minutes after taking the medication. 08/2020 BMD results suggest: Osteopenia at the lumbar spine as well as right and left femoral neck with a -6% since last DEXA.   10/17/24: DEXA scan: Osteoporosis right femoral neck: T-score: -2.6 10/18/24: Patient would like to try fosamax before trying reclast   Given hypercalcemia, recommend to avoid calcium , decrease vitamin D to 1000 units once daily.   Patient would like to try fosamax before she attempts reclast, described  Follow fall precautions, adequate dairy in diet and exercises (aerobic, balancing and weight bearing) as tolerated.   Return in about 3 months (around 01/18/2025).  I have reviewed current medications, nurse's notes, allergies, vital signs, past medical and surgical history, family medical history, and social history for this  encounter. Counseled patient on symptoms, examination findings, lab findings, imaging results, treatment decisions and monitoring and prognosis. The patient understood the recommendations and agrees with the treatment plan. All questions regarding treatment plan were fully answered.   Virginia Birmingham, MD   10/18/24   History of Present Illness Virginia [name] is a 83 y.o. year old female who presents to our clinic with hypercalcemia and osteopenia. She is currently not taking Calcium   and vitamin D 2000 international units once daily.  Not on lithium/hydrochlorothiazide No falls/fractures Not taking calcium  Taking Vit D 2000 units/day   08/2020 Results:   Lumbar spine L1-L4 Femoral neck (FN)  T-score -2.2 RFN: -1.8 LFN: -2.2  Change in BMD from previous DXA test (%) n/a  -6%*   Risk Factors screening:  History of low trauma fractures: No Family history of osteoporosis: No Hip fracture in first-degree relatives: No Smoking history: No Excessive alcohol intake >2 drinks/day: No Excessive caffeine intake >2 drinks/day: No Glucocorticoid use >5mg  prednisone/day for >3 months: No Rheumatoid arthritis history: No Premature/Surgical Menopause: No   Anti-epileptic drugs No  Celiac disease/signs of malabsorption No  Gastric bypass/gastrectomy No  PPI use yes   TZD use No  Gonadotropin/androgen suppressing drugs No  Aromatase Inhibitors No  Thyroid  hormone suppressive therapy No  Patient denies a history of kidney stones current hematuria No polyuria No nocturia No thirst No renal failure No anorexia No abdominal pain No heartburn No, on PPI constipation No nausea or vomiting No history of peptic ulcer disease No depression No confusion No excessive fatigue No fracture No osteoporosis No headaches No numbness No tingling No  family history of renal stones/hypercalcemia No a  personal history of MEN syndromes/medullary thyroid  cancer/ pheochromocytoma  No  Physical Exam  BP 136/80   Pulse 75   Ht 5' 3 (1.6 m)   Wt 162 lb (73.5 kg)   SpO2 96%   BMI 28.70 kg/m  Constitutional: well developed, well nourished Head: normocephalic, atraumatic Eyes: sclera anicteric, no redness Neck: supple Lungs: normal respiratory effort Neurology: alert and oriented Skin: dry, no appreciable rashes Musculoskeletal: no appreciable defects Psychiatric: normal mood and affect  Allergies Allergies  Allergen Reactions   Penicillins Hives    Did it involve swelling of the face/tongue/throat, SOB, or low BP? No Did it involve sudden or severe rash/hives, skin peeling, or any reaction on the inside of your mouth or nose? No Did you need to seek medical attention at a hospital or doctor's office? No When did it last happen? many years ago      If all above answers are "NO", may proceed with cephalosporin use.    Statins Other (See Comments)    pain in base of thumbs   Bactrim  [Sulfamethoxazole -Trimethoprim ] Other (See Comments)    Cause stomach issues    Current Medications Patient's Medications  New Prescriptions   ALENDRONATE (FOSAMAX) 70 MG TABLET    Take 1 tablet (70 mg total) by mouth every 7 (seven) days. Take with a full glass of water on an empty stomach.  Previous Medications   CARBOXYMETHYLCELLULOSE (REFRESH PLUS) 0.5 % SOLN    Place 4 drops into both eyes daily.   CHOLECALCIFEROL (D3 HIGH POTENCY) 50 MCG (2000 UT) CAPS    Take by mouth daily.   CYCLOSPORINE (VEVYE OP)    Apply 2 drops to eye daily.   EZETIMIBE  (ZETIA ) 10 MG TABLET    TAKE 1 TABLET BY MOUTH EVERY DAY   FENOFIBRATE  (TRICOR ) 145 MG TABLET    TAKE 1 TABLET BY MOUTH EVERY DAY   MULTIPLE VITAMINS-MINERALS (AIRBORNE PO)    Take 1 tablet by mouth daily.   OMEGA-3 FATTY ACIDS (FISH OIL PO)    Take 1 tablet by mouth daily.   PANTOPRAZOLE  (PROTONIX ) 40 MG TABLET    TAKE 1 TABLET (40 MG TOTAL) BY MOUTH DAILY BEFORE BREAKFAST. TAKE 30 TO 60 MINUTES BEFORE A MEAL   PROPRANOLOL  ER  (INDERAL  LA) 80 MG 24 HR CAPSULE    Take 1 capsule (80 mg total) by mouth daily.   SEMAGLUTIDE , 1 MG/DOSE, (OZEMPIC , 1 MG/DOSE,) 4 MG/3ML SOPN    INJECT 1 MG ONCE A WEEK AS DIRECTED   TELMISARTAN  (MICARDIS ) 20 MG TABLET    Take 1 tablet (20 mg total) by mouth daily.  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  Social History Social History   Socioeconomic History   Marital status: Widowed    Spouse name: Not on file   Number of children: 2   Years of education: Not on file   Highest education level: Not on file  Occupational  History   Occupation: retired  Tobacco Use   Smoking status: Never   Smokeless tobacco: Never   Tobacco comments:    Married, lives with spouse. Retired Facilities Manager Use   Vaping status: Never Used  Substance and Sexual Activity   Alcohol use: No    Alcohol/week: 0.0 standard drinks of alcohol   Drug use: No   Sexual activity: Not Currently  Other Topics Concern   Not on file  Social History Narrative    2 children;   One in [place] and one is in [place]   Retired runner, broadcasting/film/video   Lives alone/2025   Social Drivers of Health   Financial Resource Strain: Low Risk  (09/02/2024)   Overall Financial Resource Strain (CARDIA)    Difficulty of Paying Living Expenses: Not hard at all  Food Insecurity: No Food Insecurity (09/02/2024)   Hunger Vital Sign    Worried About Running Out of Food in the Last Year: Never true    Ran Out of Food in the Last Year: Never true  Transportation Needs: No Transportation Needs (09/02/2024)   PRAPARE - Administrator, Civil Service (Medical): No    Lack of Transportation (Non-Medical): No  Physical Activity: Inactive (09/02/2024)   Exercise Vital Sign    Days of Exercise per Week: 0 days    Minutes of Exercise per Session: 0 min  Stress: Stress Concern Present (09/02/2024)   [employer] of Occupational Health - Occupational Stress Questionnaire    Feeling of Stress: To some extent  Social Connections: Moderately Integrated (09/02/2024)   Social Connection and Isolation Panel    Frequency of Communication with Friends and Family: More than three times a week    Frequency of Social Gatherings with Friends and Family: Once a week    Attends Religious Services: More than 4 times per year    Active Member of [employer] or Organizations: Yes    Attends Banker Meetings: More than 4 times per year    Marital Status: Widowed  Intimate Partner Violence: Patient Unable To Answer (09/02/2024)   Humiliation, Afraid, Rape, and Kick  questionnaire    Fear of Current or Ex-Partner: Patient unable to answer    Emotionally Abused: Patient unable to answer    Physically Abused: Patient unable to answer    Sexually Abused: Patient  unable to answer    Laboratory Investigations No components found for: CMP No components found for: BMP Lab Results  Component Value Date   GFR 71.11 09/05/2024   Lab Results  Component Value Date   CREATININE 0.78 09/05/2024   No results found for: CBC No components found for: LFT No components found for: VITD Lab Results  Component Value Date   PTH 29 08/31/2024    Lab Results  Component Value Date   TSH 3.18 09/05/2024    No components found for: RENAL FUNCTION No components found for: MAGNESIUM  Parts of this note may have been dictated using voice recognition software. There may be variances in spelling and vocabulary which are unintentional. Not all errors are proofread. Please notify the dino if any discrepancies are noted or if the meaning of any statement is not clear.

## 2024-10-19 ENCOUNTER — Ambulatory Visit (HOSPITAL_COMMUNITY)
Admission: RE | Admit: 2024-10-19 | Discharge: 2024-10-19 | Disposition: A | Discharge status: Home / Self Care | Source: Ambulatory Visit | Attending: Surgery | Admitting: Surgery

## 2024-10-19 DIAGNOSIS — E042 Nontoxic multinodular goiter: Secondary | ICD-10-CM | POA: Diagnosis not present

## 2024-10-19 DIAGNOSIS — E213 Hyperparathyroidism, unspecified: Secondary | ICD-10-CM | POA: Diagnosis not present

## 2024-10-19 MED ORDER — TECHNETIUM TC 99M SESTAMIBI - CARDIOLITE
26.9000 | Freq: Once | INTRAVENOUS | Status: AC | PRN
  Administered 2024-10-19: 26.9 via INTRAVENOUS

## 2024-10-24 ENCOUNTER — Ambulatory Visit: Payer: Self-pay | Admitting: Surgery

## 2024-10-24 DIAGNOSIS — H04123 Dry eye syndrome of bilateral lacrimal glands: Secondary | ICD-10-CM | POA: Diagnosis not present

## 2024-10-24 NOTE — Progress Notes (Signed)
 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  However, the USN demonstrates a moderately suspicious nodule in the left thyroid  lobe (about 15% chance of cancer) which will need a needle biopsy prior to considering parathyroid  surgery.  Will order FNA biopsy of the left 1.5cm nodule to be done by radiology.  I will contact the patient with the results when they are available and then make plans for parathyroid  surgery at that time.  Burnard - please arrange FNA biopsy of left thyroid  nodule.  Krystal Spinner, MD [facility] practice Office: [phone]

## 2024-10-25 ENCOUNTER — Other Ambulatory Visit: Payer: Self-pay | Admitting: Internal Medicine

## 2024-10-31 ENCOUNTER — Other Ambulatory Visit: Payer: Self-pay | Admitting: Surgery

## 2024-10-31 DIAGNOSIS — E041 Nontoxic single thyroid nodule: Secondary | ICD-10-CM

## 2024-11-03 ENCOUNTER — Encounter: Payer: Self-pay | Admitting: Surgery

## 2024-11-03 DIAGNOSIS — M5441 Lumbago with sciatica, right side: Secondary | ICD-10-CM | POA: Diagnosis not present

## 2024-11-03 DIAGNOSIS — M9903 Segmental and somatic dysfunction of lumbar region: Secondary | ICD-10-CM | POA: Diagnosis not present

## 2024-11-07 DIAGNOSIS — H04123 Dry eye syndrome of bilateral lacrimal glands: Secondary | ICD-10-CM | POA: Diagnosis not present

## 2024-11-19 ENCOUNTER — Other Ambulatory Visit: Payer: Self-pay | Admitting: Internal Medicine

## 2024-11-20 ENCOUNTER — Other Ambulatory Visit: Payer: Self-pay | Admitting: Physician Assistant

## 2024-11-22 ENCOUNTER — Other Ambulatory Visit (HOSPITAL_COMMUNITY)
Admission: RE | Admit: 2024-11-22 | Discharge: 2024-11-22 | Disposition: A | Discharge status: Home / Self Care | Source: Ambulatory Visit | Attending: Radiology | Admitting: Radiology

## 2024-11-22 ENCOUNTER — Ambulatory Visit
Admission: RE | Admit: 2024-11-22 | Discharge: 2024-11-22 | Disposition: A | Discharge status: Home / Self Care | Source: Ambulatory Visit | Attending: Surgery | Admitting: Surgery

## 2024-11-22 DIAGNOSIS — E041 Nontoxic single thyroid nodule: Secondary | ICD-10-CM | POA: Diagnosis present

## 2024-11-25 LAB — CYTOLOGY - NON PAP

## 2024-11-26 ENCOUNTER — Ambulatory Visit: Payer: Self-pay | Admitting: Surgery

## 2024-11-26 NOTE — Progress Notes (Signed)
 Thyroid  biopsy obtained too little tissue for diagnosis.  Will need to repeat the FNA biopsy of the 1.5 cm nodule on the left side.  Burnard - please arrange a repeat biopsy for this patient.  Krystal Spinner, MD [facility] practice Office: [phone]

## 2024-12-05 ENCOUNTER — Other Ambulatory Visit: Payer: Self-pay | Admitting: Surgery

## 2024-12-05 ENCOUNTER — Ambulatory Visit: Payer: Self-pay

## 2024-12-05 ENCOUNTER — Other Ambulatory Visit: Payer: Self-pay

## 2024-12-05 ENCOUNTER — Emergency Department (HOSPITAL_BASED_OUTPATIENT_CLINIC_OR_DEPARTMENT_OTHER): Admission: EM | Admit: 2024-12-05 | Discharge: 2024-12-05 | Disposition: A | Discharge status: Home / Self Care

## 2024-12-05 ENCOUNTER — Encounter (HOSPITAL_BASED_OUTPATIENT_CLINIC_OR_DEPARTMENT_OTHER): Payer: Self-pay

## 2024-12-05 ENCOUNTER — Emergency Department (HOSPITAL_BASED_OUTPATIENT_CLINIC_OR_DEPARTMENT_OTHER)

## 2024-12-05 DIAGNOSIS — Z79899 Other long term (current) drug therapy: Secondary | ICD-10-CM | POA: Diagnosis not present

## 2024-12-05 DIAGNOSIS — I129 Hypertensive chronic kidney disease with stage 1 through stage 4 chronic kidney disease, or unspecified chronic kidney disease: Secondary | ICD-10-CM | POA: Diagnosis not present

## 2024-12-05 DIAGNOSIS — K429 Umbilical hernia without obstruction or gangrene: Secondary | ICD-10-CM | POA: Insufficient documentation

## 2024-12-05 DIAGNOSIS — K8689 Other specified diseases of pancreas: Secondary | ICD-10-CM

## 2024-12-05 DIAGNOSIS — K869 Disease of pancreas, unspecified: Secondary | ICD-10-CM | POA: Insufficient documentation

## 2024-12-05 DIAGNOSIS — R1033 Periumbilical pain: Secondary | ICD-10-CM | POA: Diagnosis present

## 2024-12-05 DIAGNOSIS — N189 Chronic kidney disease, unspecified: Secondary | ICD-10-CM | POA: Diagnosis not present

## 2024-12-05 DIAGNOSIS — E041 Nontoxic single thyroid nodule: Secondary | ICD-10-CM

## 2024-12-05 LAB — COMPREHENSIVE METABOLIC PANEL WITH GFR
ALT: 15 U/L (ref 0–44)
AST: 15 U/L (ref 15–41)
Albumin: 4.3 g/dL (ref 3.5–5.0)
Alkaline Phosphatase: 56 U/L (ref 38–126)
Anion gap: 10 (ref 5–15)
BUN: 19 mg/dL (ref 8–23)
CO2: 26 mmol/L (ref 22–32)
Calcium: 11.3 mg/dL — ABNORMAL HIGH (ref 8.9–10.3)
Chloride: 106 mmol/L (ref 98–111)
Creatinine, Ser: 0.8 mg/dL (ref 0.44–1.00)
GFR, Estimated: >60 mL/min
Glucose, Bld: 93 mg/dL (ref 70–99)
Potassium: 4.4 mmol/L (ref 3.5–5.1)
Sodium: 142 mmol/L (ref 135–145)
Total Bilirubin: 0.6 mg/dL (ref 0.0–1.2)
Total Protein: 6.8 g/dL (ref 6.5–8.1)

## 2024-12-05 LAB — CBC
HCT: 43.6 % (ref 36.0–46.0)
Hemoglobin: 14.4 g/dL (ref 12.0–15.0)
MCH: 29.2 pg (ref 26.0–34.0)
MCHC: 33 g/dL (ref 30.0–36.0)
MCV: 88.4 fL (ref 80.0–100.0)
Platelets: 195 K/uL (ref 150–400)
RBC: 4.93 MIL/uL (ref 3.87–5.11)
RDW: 13.4 % (ref 11.5–15.5)
WBC: 6.8 K/uL (ref 4.0–10.5)
nRBC: 0 % (ref 0.0–0.2)

## 2024-12-05 LAB — LIPASE, BLOOD: Lipase: 41 U/L (ref 11–51)

## 2024-12-05 LAB — LACTIC ACID, PLASMA: Lactic Acid, Venous: 1 mmol/L (ref 0.5–1.9)

## 2024-12-05 MED ORDER — IOHEXOL 300 MG/ML  SOLN
100.0000 mL | Freq: Once | INTRAMUSCULAR | Status: AC | PRN
  Administered 2024-12-05: 100 mL via INTRAVENOUS

## 2024-12-05 MED ORDER — CEPHALEXIN 500 MG PO CAPS: 500.0000 mg | ORAL_CAPSULE | Freq: Four times a day (QID) | ORAL | 0 refills | Status: AC

## 2024-12-05 NOTE — Discharge Instructions (Addendum)
 May take over-the-counter pain medication such as Tylenol  or ibuprofen.  We are prescribing antibiotics to help with infection.  We would like you to follow-up with general surgery.  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  Return for fevers, chills, severe pain, inability eat or drink due to nausea vomiting, stop having bowel movements or passing gas or any new or worsening symptoms that are concerning to you.

## 2024-12-05 NOTE — Telephone Encounter (Signed)
 FYI Only or Action Required?: FYI only for provider: ED advised.  Patient was last seen in primary care on 09/05/2024 by Geofm Glade PARAS, MD.  Called Nurse Triage reporting Hernia and Abdominal Pain.  Symptoms began several days ago.  Interventions attempted: Nothing.  Symptoms are: stable.  Triage Disposition: Go to ED Now (Notify PCP)  Patient/caregiver understands and will follow disposition?: Yes                   Copied from CRM [id number]. Topic: Clinical - Red Word Triage >> Dec 05, 2024  9:12 AM Jasmin G wrote: Red Word that prompted transfer to Nurse Triage: Hernia on her navel that is really painful. Reason for Disposition  Hernia is painful or tender to touch  Answer Assessment - Initial Assessment Questions Caller was not on line at transfer. Called patient back who reports has herniated navel has been fine up until this week, had a chricopractor session was on stomach he did pushing down for spine alignment,. Every since then navel has been pushed in and has been hurting in that area few days was constant and now , right now pain is only with touch not constant, any touch the area is painful and tender,  pain is pressure dependent. This RN advised ER now for evaluation patient is agreeable.    1. ONSET:  When did this first appear?     This week  2. APPEARANCE: What does it look like?     Hernia is pushed in was sticking out   4. LOCATION: Where exactly is the hernia located?     [place]  5. PATTERN: Does the swelling come and go, or has it been constant since it started?     Swelling constant , pain is with any touch and pressure was constant earlier   8. OTHER SYMPTOMS: Do you have any other symptoms? (e.g., fever, abdomen pain, vomiting)     Patient denies the following vomiting , dizziness  Protocols used: Hernia-A-AH

## 2024-12-05 NOTE — ED Triage Notes (Signed)
 Pt states that she is having some abdominal pain located mostly near her belly button. Noted pain last week and thought pain would get better. Denies nausea, vomiting and diarrhea. Notes that she has bulge like hernia near belly button.

## 2024-12-05 NOTE — ED Provider Notes (Signed)
 " [place] [facility] Provider Note   CSN: [id number] Arrival date & time: 12/05/24  1123     Patient presents with: Abdominal Pain   Virginia [name] is a 84 y.o. female.   This is an 84 year old female presenting emergency department for abdominal pain.  Periumbilical.  Started last week after a chiropractor adjustment.  Reports that she thinks she has an umbilical hernia as she has had a bulge in that location in the past.  After an adjustment, seem to protrude more and was painful.  Pain has subsided, but still present.  No nausea vomiting.  Still having bowel movements.  Still passing gas.   Abdominal Pain      Prior to Admission medications  Medication Sig Start Date End Date Taking? Authorizing Provider  cephALEXin  (KEFLEX ) 500 MG capsule Take 1 capsule (500 mg total) by mouth 4 (four) times daily. 12/05/24  Yes Eara Burruel, Caron J, DO  VEVYE 0.1 % SOLN Apply 1 drop to eye 2 (two) times daily. 11/15/24  Yes [provider]  alendronate  (FOSAMAX ) 70 MG tablet Take 1 tablet (70 mg total) by mouth every 7 (seven) days. Take with a full glass of water on an empty stomach. 10/18/24   Motwani, Komal, MD  carboxymethylcellulose (REFRESH PLUS) 0.5 % SOLN Place 4 drops into both eyes daily.    [provider]  Cholecalciferol (D3 HIGH POTENCY) 50 MCG (2000 UT) CAPS Take by mouth daily.    [provider]  cycloSPORINE (VEVYE OP) Apply 2 drops to eye daily.    [provider]  ezetimibe  (ZETIA ) 10 MG tablet TAKE 1 TABLET BY MOUTH EVERY DAY 08/02/24   Geofm Glade PARAS, MD  fenofibrate  (TRICOR ) 145 MG tablet TAKE 1 TABLET BY MOUTH EVERY DAY 07/07/24   Geofm Glade PARAS, MD  Multiple Vitamins-Minerals (AIRBORNE PO) Take 1 tablet by mouth daily.    [provider]  Omega-3 Fatty Acids (FISH OIL PO) Take 1 tablet by mouth daily.    [provider]  pantoprazole  (PROTONIX ) 40 MG tablet TAKE 1 TABLET (40 MG TOTAL)  BY MOUTH DAILY BEFORE BREAKFAST. TAKE 30 TO 60 MINUTES BEFORE A MEAL 11/21/24   Craig Alan SAUNDERS, PA-C  propranolol  ER (INDERAL  LA) 80 MG 24 hr capsule Take 1 capsule (80 mg total) by mouth daily. 06/29/24   Mealor, Eulas BRAVO, MD  Semaglutide , 1 MG/DOSE, (OZEMPIC , 1 MG/DOSE,) 4 MG/3ML SOPN INJECT 1 MG ONCE A WEEK AS DIRECTED 11/21/24   Geofm Glade PARAS, MD  telmisartan  (MICARDIS ) 20 MG tablet TAKE 1 TABLET BY MOUTH EVERY DAY 10/25/24   Geofm Glade PARAS, MD    Allergies: Penicillins, Statins, and Bactrim  [sulfamethoxazole -trimethoprim ]    Review of Systems  Gastrointestinal:  Positive for abdominal pain.    Updated Vital Signs BP (!) 161/107   Pulse 74   Temp 97.6 F (36.4 C) (Oral)   Resp 18   Ht 5' 3 (1.6 m)   Wt 72.6 kg   SpO2 95%   BMI 28.34 kg/m   Physical Exam Vitals and nursing note reviewed.  Constitutional:      General: She is not in acute distress.    Appearance: She is not toxic-appearing.  HENT:     Head: Normocephalic.  Cardiovascular:     Rate and Rhythm: Normal rate and regular rhythm.  Pulmonary:     Effort: Pulmonary effort is normal.     Breath sounds: Normal breath sounds.  Abdominal:  General: Abdomen is flat.     Palpations: Abdomen is soft.     Tenderness: There is abdominal tenderness in the periumbilical area.  Skin:    General: Skin is warm.     Capillary Refill: Capillary refill takes less than 2 seconds.  Neurological:     General: No focal deficit present.     Mental Status: She is alert.  Psychiatric:        Mood and Affect: Mood normal.        Behavior: Behavior normal.     (all labs ordered are listed, but only abnormal results are displayed) Labs Reviewed  COMPREHENSIVE METABOLIC PANEL WITH GFR - Abnormal; Notable for the following components:      Result Value   Calcium  11.3 (*)    All other components within normal limits  CBC  LIPASE, BLOOD  LACTIC ACID, PLASMA    EKG: None  Radiology: No results  found.    Procedures   Medications Ordered in the ED  iohexol  (OMNIPAQUE ) 300 MG/ML solution 100 mL (100 mLs Intravenous Contrast Given 12/05/24 1321)    Clinical Course as of 12/08/24 0658  Mon Dec 05, 2024  1531 CT ABDOMEN PELVIS W CONTRAST IMPRESSION: 1. Small fat containing left paraumbilical ventral hernia with inflammatory stranding in the hernia sac and the underlying omental fat, possibly reflecting early vascular compromise/omental infarct. No small bowel involvement or bowel obstruction.  2. In the posterior pancreatic body, there is a 2.1 x 1.9 cm cyst (axial 24) possibly reflecting a remote pseudocyst or side branch intraductal papillary mucinous neoplasm. 3. Small paraesophageal hiatal hernia. 4. Descending and sigmoid colonic diverticulosis. No changes of acute diverticulitis. 5. Cholecystolithiasis. No changes of acute cholecystitis.   [TY]  1542 Will discharge in stable condition.  Will give antibiotics to cover possible infection given the stranding seen on CT scan.  Did discuss patient's pancreatic mass and need for outpatient follow-up.  Will give follow up with surgery.  Stable for discharge at this time. [TY]    Clinical Course User Index [TY] Virginia Caron PARAS, DO                                 Medical Decision Making This is an 84 year old female complex past medical history to include hypertension hyperlipidemia fibroid's, Schatzki's ring, intestinal ulcers, chronic kidney disease presented to the emergency department for periumbilical abdominal pain.  Afebrile nontachycardic, slightly hypertensive.  Does have some tenderness around her umbilicus, but I do not appreciate overt hernia.  Exam difficult secondary to patient's habitus however.  No skin changes.  He is not having obstructive symptoms.  Will get screening labs, CT scan.  Offered pain medications, but declined.    Amount and/or Complexity of Data Reviewed External Data Reviewed:     Details: Had  fibroidectomy, but no other abdominal surgeries on record Labs: ordered. Decision-making details documented in ED Course. Radiology: ordered and independent interpretation performed. Decision-making details documented in ED Course.  Risk Prescription drug management. Decision regarding hospitalization. Diagnosis or treatment significantly limited by social determinants of health.      Final diagnoses:  Umbilical hernia without obstruction and without gangrene  Pancreatic mass    ED Discharge Orders          Ordered    cephALEXin  (KEFLEX ) 500 MG capsule  4 times daily        12/05/24 1543  Virginia Caron PARAS, DO 12/08/24 [phone]  "

## 2024-12-09 ENCOUNTER — Encounter: Payer: Self-pay | Admitting: Internal Medicine

## 2024-12-13 ENCOUNTER — Ambulatory Visit: Admitting: Nurse Practitioner

## 2024-12-13 ENCOUNTER — Encounter: Payer: Self-pay | Admitting: Nurse Practitioner

## 2024-12-13 VITALS — BP 130/62 | HR 74 | Ht 63.0 in | Wt 159.2 lb

## 2024-12-13 DIAGNOSIS — K862 Cyst of pancreas: Secondary | ICD-10-CM | POA: Diagnosis not present

## 2024-12-13 NOTE — Patient Instructions (Signed)
 _______________________________________________________  If your blood pressure at your visit was 140/90 or greater, please contact your primary care physician to follow up on this.  _______________________________________________________  If you are age 82 or older, your body mass index should be between 23-30. Your Body mass index is 28.21 kg/m. If this is out of the aforementioned range listed, please consider follow up with your Primary Care Provider.  If you are age 64 or younger, your body mass index should be between 19-25. Your Body mass index is 28.21 kg/m. If this is out of the aformentioned range listed, please consider follow up with your Primary Care Provider.   ________________________________________________________  The [facility] providers would like to encourage you to use MYCHART to communicate with providers for non-urgent requests or questions.  Due to long hold times on the telephone, sending your provider a message by [facility] may be a faster and more efficient way to get a response.  Please allow 48 business hours for a response.  Please remember that this is for non-urgent requests.  _______________________________________________________  [facility] is using a team-based approach to care.  Your team is made up of your doctor and two to three APPS. Our APPS (Nurse Practitioners and Physician Assistants) work with your physician to ensure care continuity for you. They are fully qualified to address your health concerns and develop a treatment plan. They communicate directly with your gastroenterologist to care for you. Seeing the Advanced Practice Practitioners on your physician's team can help you by facilitating care more promptly, often allowing for earlier appointments, access to diagnostic testing, procedures, and other specialty referrals.   You have been scheduled for an MRI at [facility] on 12-20-24. Your appointment time is 430pm. Please  arrive to admitting (at main entrance of the hospital) 15 minutes prior to your appointment time for registration purposes. Please make certain not to have anything to eat or drink 4 hours prior to your test. In addition, if you have any metal in your body, have a pacemaker or defibrillator, please be sure to let your ordering physician know. This test typically takes 45 minutes to 1 hour to complete. Should you need to reschedule, please call [phone] to do so.  Thank you for trusting me with your gastrointestinal care!   Elida Shawl, CRNP

## 2024-12-13 NOTE — Progress Notes (Signed)
 "    12/13/2024 Virginia [name] [id number] [birth year]   Primary Gastroenterologist: Dr. Albertus   Chief Complaint: CT showed a spot on pancreas   History of Present Illness: History of anxiety, osteopenia, hypercholesterolemia, CKD ,GERD, hiatal hernia and colon polyps   Discussed the use of AI scribe software for clinical note transcription with the patient, who gave verbal consent to proceed.  History of Present Illness Virginia [name] is an 84 year old female with umbilical hernia and diverticulitis who presents for evaluation of a pancreatic cystic lesion identified on recent CT scan.  She presented to the ED 12/05/2024 due to pain related to an umbilical hernia.  A CT scan of the abdomen performed revealed a 2.1 x 1.9 cm cystic lesion in the body of the pancreas. The pain was localized to the belly button area and associated with a herniated umbilicus, which she attributes to increased abdominal pressure during a recent chiropractic visit. Pain developed later rather than immediately after the visit. She completed a 5-day course of cephalexin  for possible infection. No surgical intervention was required for the hernia.  She was seen by general surgery Dr. Vanderbilt 12/09/2024, on exam, there was no evidence of incarceration or obstruction and no indication for urgent surgery.  She was advised to follow-up after thyroid  workup completed.  She does not recall any prior abdominal CT scans except for one in December 2008 for diverticulitis, which did not mention any pancreatic abnormality. Denies prior history of pancreatitis or pancreatic problems. Lipase was checked in the emergency room and was normal. She is currently taking Ozempic  and has experienced approximately 30 pounds of weight loss since starting the medication over a year ago. Expresses concern about potential association of Ozempic  with pancreatic and thyroid  issues. No fevers, night sweats, or unexplained weight loss outside  of that attributed to Ozempic .  Bowel movements are typically daily and normal, though on the day of the visit she had not yet had a bowel movement and felt gassy. No bloody or black stools reported.  Recent cold symptoms include persistent cough, congestion, and change in voice, which are improving. Cough is productive of a small amount of white mucus. No shortness of breath, chest pain, or leg swelling.  History of thyroid  nodules with prior biopsy and a follow-up biopsy scheduled. No family history of pancreatic problems.     Latest Ref Rng & Units 12/05/2024   12:19 PM 09/05/2024   11:17 AM 05/09/2024    7:42 PM  CBC  WBC 4.0 - 10.5 K/uL 6.8  6.4  7.5   Hemoglobin 12.0 - 15.0 g/dL 85.5  85.7  85.3   Hematocrit 36.0 - 46.0 % 43.6  43.1  43.2   Platelets 150 - 400 K/uL 195  206.0  217        Latest Ref Rng & Units 12/05/2024   12:19 PM 09/05/2024   11:17 AM 08/31/2024   11:31 AM  CMP  Glucose 70 - 99 mg/dL 93  77  89   BUN 8 - 23 mg/dL 19  14  22    Creatinine 0.44 - 1.00 mg/dL 9.19  9.21  9.24   Sodium 135 - 145 mmol/L 142  142  140   Potassium 3.5 - 5.1 mmol/L 4.4  5.0  4.6   Chloride 98 - 111 mmol/L 106  105  104   CO2 22 - 32 mmol/L 26  25  30    Calcium  8.9 - 10.3 mg/dL 88.6  88.7  11.5    11.5   Total Protein 6.5 - 8.1 g/dL 6.8  6.9    Total Bilirubin 0.0 - 1.2 mg/dL 0.6  0.7    Alkaline Phos 38 - 126 U/L 56  52    AST 15 - 41 U/L 15  15    ALT 0 - 44 U/L 15  12       CTAP with contrast 12/05/2024: FINDINGS:   LOWER CHEST: Posterior bibasilar dependent atelectasis.   LIVER: Diffuse hepatic steatosis.   GALLBLADDER AND BILE DUCTS: Multiple small radiopaque gallstones. No gallbladder wall thickening. No biliary ductal dilatation.   SPLEEN: No acute abnormality.   PANCREAS: In the posterior pancreatic body there is a 2.1 x 1.9 cm cyst (axial 24).   ADRENAL GLANDS: No acute abnormality.   KIDNEYS, URETERS AND BLADDER: Subcentimeter hypodensity in the upper pole  of the right kidney, too small to definitively characterize, but likely a small cyst. Per consensus, no follow-up is needed for simple Bosniak type 1 and 2 renal cysts, unless the patient has a malignancy history or risk factors. No stones in the kidneys or ureters. No hydronephrosis. No perinephric or periureteral stranding. Urinary bladder is unremarkable.   GI AND BOWEL: Small paraesophageal hiatal hernia. Descending and sigmoid colonic diverticulosis. Normal decompressed appendix. Stomach demonstrates no acute abnormality. There is no bowel obstruction.   PERITONEUM AND RETROPERITONEUM: Small fat containing left paraumbilical ventral hernia. Inflammatory stranding noted within the hernia fat and the subjacent omental fat, which may reflect changes of early vascular compromise/omental infarct. No ascites. No free air.   VASCULATURE: Aorta is normal in caliber.   LYMPH NODES: No lymphadenopathy.   REPRODUCTIVE ORGANS: Multiple tiny calcified, involuted uterine fibroids. No concerning adnexal mass or free pelvic fluid.   BONES AND SOFT TISSUES: Mild levocurvature of the lumbar spine. Multilevel degenerative disc disease of the thoracolumbar spine. Mild bilateral hip osteoarthritis. No acute osseous abnormality. No focal soft tissue abnormality.   IMPRESSION: 1. Small fat containing left paraumbilical ventral hernia with inflammatory stranding in the hernia sac and the underlying omental fat, possibly reflecting early vascular compromise/omental infarct. No small bowel involvement or bowel obstruction. 2. In the posterior pancreatic body, there is a 2.1 x 1.9 cm cyst (axial 24) possibly reflecting a remote pseudocyst or side branch intraductal papillary mucinous neoplasm. 3. Small paraesophageal hiatal hernia. 4. Descending and sigmoid colonic diverticulosis. No changes of acute diverticulitis. 5. Cholecystolithiasis. No changes of acute cholecystitis.   GI  PROCEDURES:  05/2018 colonoscopy due to history of adenomatous polyps with villous component had 3 polyps largest 5 mm tubular adenoma no high-grade dysplasia.  Diverticula and small internal hemorrhoids. 06/24/2019 upper endo for IDA medium sized hiatal hernia, gastritis, gastric polyps 06/30/2019 capsule endo cratered ulcers small bowel 09/24/2021 office visit with Greig Corti for mid/right lower quadrant abdominal discomfort proceeded with CT and colon if unrevealing.  But patient never proceeded with CT abdomen pelvis with contrast. 01/21/2022 colonoscopy Dr. Albertus for history of adenomatous polyps, good bowel prep, 3 mm TA polyp cecum, 4 mm TA polyp in ascending colon, 8 mm TA polyp descending, 3 mm hyperplastic polyp sigmoid, diverticulosis and small internal hemorrhoids.   No recall secondary to age.   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 eye   Chronic kidney disease    Diabetes (HCC) 08/2022   Diverticulosis    Dry eyes    Dry eyes 07/2024   FIBROIDS, UTERUS    s/p fibroidectomy   GERD    Hernia, umbilical    HYPERLIPIDEMIA    intol of statins (myalgia, thumb pain)   HYPERTENSION    Intestinal ulcer 06/30/2019   2 large ulcerations in the mid to distal small bowel (seen on capsule endoscopy)   OSTEOARTHRITIS    Osteopenia 06/21/2014   DEXA@LB  06/14/14: -1.6   SCHATZKI'S RING 11/1999, 08/2009   s/p dilation   Scoliosis    Tachycardia    Tubular adenoma of colon    Past Surgical History:  Procedure Laterality Date   BIOPSY THYROID      child birth  44 & 50   COLONOSCOPY     ESOPHAGEAL DILATION N/A    Fibroidectomy     Low back surgery  01/1994   2 bulging disc   POLYPECTOMY     TONSILLECTOMY  1968   UPPER GASTROINTESTINAL ENDOSCOPY     Medications Ordered Prior to  Encounter[1] Allergies[2]  Current Medications, Allergies, Past Medical History, Past Surgical History, Family History and Social History were reviewed in [employer] record.   Review of Systems:   Constitutional: Negative for fever, sweats, chills or weight loss.  Respiratory: Negative for shortness of breath.   Cardiovascular: Negative for chest pain, palpitations and leg swelling.  Gastrointestinal: See HPI.  Musculoskeletal: Negative for back pain or muscle aches.  Neurological: Negative for dizziness, headaches or paresthesias.    Physical Exam: BP 130/62   Pulse 74   Ht 5' 3 (1.6 m)   Wt 159 lb 4 oz (72.2 kg)   BMI 28.21 kg/m   Wt Readings from Last 3 Encounters:  12/13/24 159 lb 4 oz (72.2 kg)  12/05/24 160 lb (72.6 kg)  10/18/24 162 lb (73.5 kg)    General: 84 year old female in no acute distress. Head: Normocephalic and atraumatic. Eyes: No scleral icterus. Conjunctiva pink . Ears: Normal auditory acuity. Mouth: Dentition intact. No ulcers or lesions.  Lungs: Clear throughout to auscultation. Heart: Regular rate and rhythm, no murmur. Abdomen: Soft, nontender. Mild gaseous distension. Umbilical hernia, nontender and reducible. No masses or hepatomegaly. Normal bowel sounds x 4 quadrants.  Rectal: Deferred.  Musculoskeletal: Symmetrical with no gross deformities. Extremities: No edema. Neurological: Alert oriented x 4. No focal deficits.  Psychological: Alert and cooperative. Normal mood and affect  Assessment and Recommendations:  84 year old female with a a 2.1 x 1.9 cm cyst to the body of the pancreas, possibly reflecting a remote pseudocyst or side branch intraductal papillary mucinous neoplasm identified per CTAP with contrast 12/05/2024. No prior history of known pancreatic cyst.  - Abdominal MRI with and without contrasts  - Further recommendations to be determined after abdominal MRI results reviewed   Cough, productive with  white phlegm - Follow up with PCP  Small fat containing left paraumbilical hernia per CTAP 12/05/2024. Evaluated by general surgery, surgical intervention deferred pending thyroid  biopsy results  - Follow-up with general surgery after thyroid  evaluation completed  Multinodular Thyroid . S/P left thyroid  biopsy 12/23, path report showed scant follicular epithelium scheduled for additional left thyroid  biopsy on 12/22/2024.     [1]  Current Outpatient Medications on File Prior to Visit  Medication Sig Dispense Refill   carboxymethylcellulose (REFRESH PLUS) 0.5 % SOLN Place 4 drops  into both eyes daily.     Cholecalciferol (D3 HIGH POTENCY) 50 MCG (2000 UT) CAPS Take by mouth daily.     cycloSPORINE (VEVYE OP) Apply 2 drops to eye daily.     ezetimibe  (ZETIA ) 10 MG tablet TAKE 1 TABLET BY MOUTH EVERY DAY 90 tablet 3   fenofibrate  (TRICOR ) 145 MG tablet TAKE 1 TABLET BY MOUTH EVERY DAY 90 tablet 1   Multiple Vitamins-Minerals (AIRBORNE PO) Take 1 tablet by mouth daily.     Omega-3 Fatty Acids (FISH OIL PO) Take 1 tablet by mouth daily.     pantoprazole  (PROTONIX ) 40 MG tablet TAKE 1 TABLET (40 MG TOTAL) BY MOUTH DAILY BEFORE BREAKFAST. TAKE 30 TO 60 MINUTES BEFORE A MEAL 90 tablet 1   propranolol  ER (INDERAL  LA) 80 MG 24 hr capsule Take 1 capsule (80 mg total) by mouth daily. 90 capsule 1   Semaglutide , 1 MG/DOSE, (OZEMPIC , 1 MG/DOSE,) 4 MG/3ML SOPN INJECT 1 MG ONCE A WEEK AS DIRECTED 9 mL 1   telmisartan  (MICARDIS ) 20 MG tablet TAKE 1 TABLET BY MOUTH EVERY DAY 90 tablet 1   VEVYE 0.1 % SOLN Apply 1 drop to eye 2 (two) times daily.     alendronate  (FOSAMAX ) 70 MG tablet Take 1 tablet (70 mg total) by mouth every 7 (seven) days. Take with a full glass of water on an empty stomach. (Patient not taking: Reported on 12/13/2024) 4 tablet 11   cephALEXin  (KEFLEX ) 500 MG capsule Take 1 capsule (500 mg total) by mouth 4 (four) times daily. (Patient not taking: Reported on 12/13/2024) 20 capsule 0   No  current facility-administered medications on file prior to visit.  [2]  Allergies Allergen Reactions   Penicillins Hives    Did it involve swelling of the face/tongue/throat, SOB, or low BP? No Did it involve sudden or severe rash/hives, skin peeling, or any reaction on the inside of your mouth or nose? No Did you need to seek medical attention at a hospital or doctor's office? No When did it last happen? many years ago      If all above answers are NO, may proceed with cephalosporin use.    Statins Other (See Comments)    pain in base of thumbs   Bactrim  [Sulfamethoxazole -Trimethoprim ] Other (See Comments)    Cause stomach issues   "

## 2024-12-16 ENCOUNTER — Other Ambulatory Visit: Payer: Self-pay | Admitting: Cardiovascular Disease

## 2024-12-20 ENCOUNTER — Ambulatory Visit (HOSPITAL_COMMUNITY)
Admission: RE | Admit: 2024-12-20 | Discharge: 2024-12-20 | Disposition: A | Discharge status: Home / Self Care | Source: Ambulatory Visit | Attending: Nurse Practitioner | Admitting: Nurse Practitioner

## 2024-12-20 ENCOUNTER — Other Ambulatory Visit: Payer: Self-pay | Admitting: Nurse Practitioner

## 2024-12-20 ENCOUNTER — Other Ambulatory Visit

## 2024-12-20 DIAGNOSIS — K862 Cyst of pancreas: Secondary | ICD-10-CM | POA: Diagnosis present

## 2024-12-20 MED ORDER — GADOBUTROL 1 MMOL/ML IV SOLN
7.0000 mL | Freq: Once | INTRAVENOUS | Status: AC | PRN
  Administered 2024-12-20: 7 mL via INTRAVENOUS

## 2024-12-21 ENCOUNTER — Other Ambulatory Visit: Payer: Self-pay | Admitting: Internal Medicine

## 2024-12-22 ENCOUNTER — Ambulatory Visit: Payer: Self-pay | Admitting: Nurse Practitioner

## 2024-12-22 ENCOUNTER — Ambulatory Visit
Admission: RE | Admit: 2024-12-22 | Discharge: 2024-12-22 | Disposition: A | Discharge status: Home / Self Care | Source: Ambulatory Visit | Attending: Surgery | Admitting: Surgery

## 2024-12-22 ENCOUNTER — Other Ambulatory Visit (HOSPITAL_COMMUNITY)
Admission: RE | Admit: 2024-12-22 | Discharge: 2024-12-22 | Disposition: A | Source: Ambulatory Visit | Attending: Diagnostic Radiology | Admitting: Diagnostic Radiology

## 2024-12-22 DIAGNOSIS — E041 Nontoxic single thyroid nodule: Secondary | ICD-10-CM | POA: Diagnosis present

## 2024-12-27 ENCOUNTER — Ambulatory Visit: Payer: Self-pay | Admitting: Surgery

## 2024-12-27 LAB — CYTOLOGY - NON PAP

## 2024-12-27 NOTE — Progress Notes (Signed)
 Telephone call to patient with results of thyroid  FNA biopsy - benign.  Will need to plan parathyroid  surgery later this spring.  Will arrange office visit to discuss.  [facility] - please arrange follow up office visit for this patient - March 2026 will be fine.  Krystal Spinner, MD [facility] practice Office: [phone]

## 2025-01-17 ENCOUNTER — Ambulatory Visit: Admitting: "Endocrinology

## 2025-03-06 ENCOUNTER — Ambulatory Visit: Admitting: Internal Medicine

## 2025-09-06 ENCOUNTER — Encounter: Admitting: Internal Medicine
